# Patient Record
Sex: Female | Born: 1940 | Race: White | Hispanic: No | Smoking: Former smoker
Health system: Southern US, Community
[De-identification: ages and names within clinical notes are randomized; demographics above are authoritative.]

## PROBLEM LIST (undated history)

## (undated) DIAGNOSIS — I10 Essential (primary) hypertension: Secondary | ICD-10-CM

## (undated) DIAGNOSIS — N750 Cyst of Bartholin's gland: Secondary | ICD-10-CM

## (undated) DIAGNOSIS — R894 Abnormal immunological findings in specimens from other organs, systems and tissues: Secondary | ICD-10-CM

## (undated) DIAGNOSIS — Z113 Encounter for screening for infections with a predominantly sexual mode of transmission: Secondary | ICD-10-CM

## (undated) DIAGNOSIS — K589 Irritable bowel syndrome without diarrhea: Secondary | ICD-10-CM

## (undated) DIAGNOSIS — F419 Anxiety disorder, unspecified: Secondary | ICD-10-CM

## (undated) DIAGNOSIS — M549 Dorsalgia, unspecified: Secondary | ICD-10-CM

## (undated) DIAGNOSIS — N993 Prolapse of vaginal vault after hysterectomy: Secondary | ICD-10-CM

## (undated) DIAGNOSIS — E785 Hyperlipidemia, unspecified: Secondary | ICD-10-CM

## (undated) DIAGNOSIS — Z8489 Family history of other specified conditions: Secondary | ICD-10-CM

## (undated) HISTORY — DX: Anxiety disorder, unspecified: F41.9

## (undated) HISTORY — DX: Abnormal immunological findings in specimens from other organs, systems and tissues: R89.4

## (undated) HISTORY — DX: Dorsalgia, unspecified: M54.9

## (undated) HISTORY — DX: Prolapse of vaginal vault after hysterectomy: N99.3

## (undated) HISTORY — PX: BLADDER REPAIR: SHX76

## (undated) HISTORY — DX: Encounter for screening for infections with a predominantly sexual mode of transmission: Z11.3

## (undated) HISTORY — DX: Cyst of Bartholin's gland: N75.0

## (undated) HISTORY — PX: TONSILLECTOMY: SUR1361

## (undated) HISTORY — PX: WRIST SURGERY: SHX841

## (undated) HISTORY — DX: Irritable bowel syndrome, unspecified: K58.9

## (undated) HISTORY — PX: ABDOMINAL HYSTERECTOMY: SHX81

---

## 2000-05-22 ENCOUNTER — Inpatient Hospital Stay (HOSPITAL_COMMUNITY): Admission: EM | Admit: 2000-05-22 | Discharge: 2000-05-24 | Payer: Self-pay | Admitting: Cardiology

## 2001-05-02 ENCOUNTER — Encounter: Payer: Self-pay | Admitting: Family Medicine

## 2001-05-02 ENCOUNTER — Ambulatory Visit (HOSPITAL_COMMUNITY): Admission: RE | Admit: 2001-05-02 | Discharge: 2001-05-02 | Payer: Self-pay | Admitting: Family Medicine

## 2001-05-17 ENCOUNTER — Ambulatory Visit (HOSPITAL_COMMUNITY): Admission: RE | Admit: 2001-05-17 | Discharge: 2001-05-17 | Payer: Self-pay | Admitting: Unknown Physician Specialty

## 2001-05-17 ENCOUNTER — Encounter: Payer: Self-pay | Admitting: Unknown Physician Specialty

## 2001-06-07 ENCOUNTER — Ambulatory Visit (HOSPITAL_COMMUNITY): Admission: RE | Admit: 2001-06-07 | Discharge: 2001-06-07 | Payer: Self-pay | Admitting: Unknown Physician Specialty

## 2001-06-07 ENCOUNTER — Encounter: Payer: Self-pay | Admitting: Unknown Physician Specialty

## 2001-06-24 ENCOUNTER — Encounter (HOSPITAL_COMMUNITY): Admission: RE | Admit: 2001-06-24 | Discharge: 2001-07-24 | Payer: Self-pay | Admitting: Unknown Physician Specialty

## 2001-07-25 ENCOUNTER — Ambulatory Visit (HOSPITAL_COMMUNITY): Admission: RE | Admit: 2001-07-25 | Discharge: 2001-07-25 | Payer: Self-pay | Admitting: Unknown Physician Specialty

## 2001-07-25 ENCOUNTER — Encounter: Payer: Self-pay | Admitting: Unknown Physician Specialty

## 2003-09-16 ENCOUNTER — Emergency Department (HOSPITAL_COMMUNITY): Admission: EM | Admit: 2003-09-16 | Discharge: 2003-09-16 | Payer: Self-pay | Admitting: Emergency Medicine

## 2003-09-18 ENCOUNTER — Ambulatory Visit (HOSPITAL_COMMUNITY): Admission: RE | Admit: 2003-09-18 | Discharge: 2003-09-18 | Payer: Self-pay | Admitting: Family Medicine

## 2004-08-24 ENCOUNTER — Emergency Department (HOSPITAL_COMMUNITY): Admission: EM | Admit: 2004-08-24 | Discharge: 2004-08-24 | Payer: Self-pay | Admitting: Emergency Medicine

## 2005-01-30 ENCOUNTER — Ambulatory Visit (HOSPITAL_COMMUNITY): Admission: RE | Admit: 2005-01-30 | Discharge: 2005-01-30 | Payer: Self-pay | Admitting: Family Medicine

## 2005-09-05 ENCOUNTER — Ambulatory Visit (HOSPITAL_COMMUNITY): Admission: RE | Admit: 2005-09-05 | Discharge: 2005-09-05 | Payer: Self-pay | Admitting: Family Medicine

## 2005-11-08 ENCOUNTER — Ambulatory Visit (HOSPITAL_COMMUNITY): Admission: RE | Admit: 2005-11-08 | Discharge: 2005-11-08 | Payer: Self-pay | Admitting: Family Medicine

## 2006-11-22 ENCOUNTER — Ambulatory Visit (HOSPITAL_COMMUNITY): Admission: RE | Admit: 2006-11-22 | Discharge: 2006-11-22 | Payer: Self-pay | Admitting: Orthopaedic Surgery

## 2006-11-27 ENCOUNTER — Ambulatory Visit (HOSPITAL_COMMUNITY): Admission: RE | Admit: 2006-11-27 | Discharge: 2006-11-27 | Payer: Self-pay | Admitting: Family Medicine

## 2007-08-07 ENCOUNTER — Ambulatory Visit (HOSPITAL_BASED_OUTPATIENT_CLINIC_OR_DEPARTMENT_OTHER): Admission: RE | Admit: 2007-08-07 | Discharge: 2007-08-08 | Payer: Self-pay | Admitting: Orthopedic Surgery

## 2007-12-19 ENCOUNTER — Ambulatory Visit (HOSPITAL_COMMUNITY): Admission: RE | Admit: 2007-12-19 | Discharge: 2007-12-19 | Payer: Self-pay | Admitting: Family Medicine

## 2008-12-21 ENCOUNTER — Ambulatory Visit (HOSPITAL_COMMUNITY): Admission: RE | Admit: 2008-12-21 | Discharge: 2008-12-21 | Payer: Self-pay | Admitting: Family Medicine

## 2009-01-07 ENCOUNTER — Ambulatory Visit (HOSPITAL_COMMUNITY): Admission: RE | Admit: 2009-01-07 | Discharge: 2009-01-07 | Payer: Self-pay | Admitting: Family Medicine

## 2009-07-28 ENCOUNTER — Ambulatory Visit (HOSPITAL_COMMUNITY): Admission: RE | Admit: 2009-07-28 | Discharge: 2009-07-28 | Payer: Self-pay | Admitting: Family Medicine

## 2009-09-22 ENCOUNTER — Encounter (HOSPITAL_COMMUNITY): Admission: RE | Admit: 2009-09-22 | Discharge: 2009-10-22 | Payer: Self-pay | Admitting: Orthopaedic Surgery

## 2009-10-21 ENCOUNTER — Ambulatory Visit (HOSPITAL_COMMUNITY): Admission: RE | Admit: 2009-10-21 | Discharge: 2009-10-21 | Payer: Self-pay | Admitting: Orthopaedic Surgery

## 2010-02-02 ENCOUNTER — Ambulatory Visit (HOSPITAL_COMMUNITY): Admission: RE | Admit: 2010-02-02 | Discharge: 2010-02-02 | Payer: Self-pay | Admitting: Orthopaedic Surgery

## 2010-04-12 ENCOUNTER — Ambulatory Visit (HOSPITAL_COMMUNITY): Admission: RE | Admit: 2010-04-12 | Discharge: 2010-04-12 | Payer: Self-pay | Admitting: Family Medicine

## 2010-05-23 ENCOUNTER — Ambulatory Visit (HOSPITAL_COMMUNITY): Admission: RE | Admit: 2010-05-23 | Discharge: 2010-05-23 | Payer: Self-pay | Admitting: Family Medicine

## 2010-07-31 ENCOUNTER — Encounter: Payer: Self-pay | Admitting: Family Medicine

## 2010-11-22 NOTE — Op Note (Signed)
NAME:  Anne Davenport, Anne Davenport                ACCOUNT NO.:  [id number]   MEDICAL RECORD NO.:  [id number]          PATIENT TYPE:  AMB   LOCATION:  DSC                          FACILITY:  [facility]   PHYSICIAN:  Matthew A. 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   PROCEDURES:  1. Left wrist arthroscopy with debridement of triangular      fibrocartilage and scapholunate interosseous ligament tears.  2. Left wrist open ulnar shortening osteotomy using the Tri-Med ulnar      shortening plate   SURGEON:  Artist Pais. Mina Marble, M.D.   ASSISTANT:  None.   ANESTHESIA:  Axillary block and general.   TOURNIQUET TIME:  1 hour and 7 minutes.   COMPLICATIONS:  None.   DRAINS:  None.   OPERATIVE REPORT:  The patient was taken to the operating suite.  After  the induction of adequate general anesthesia and axillary block  analgesia combined, the left upper extremity was prepped and draped in  sterile fashion.  An Esmarch was used to exsanguinate the limb,  tourniquet inflated to 250 mmHg.  At this point in time an incision was  made at the subcutaneous border of the ulna 4 cm proximal to the tip the  ulnar styloid.  Skin was incised.  The interval between the ECU and FCU  was identified and split.  Dissection was carried down to the ulna.  A  subperiosteal dissection was undertaken until the volar aspect of the  ulna was identified.  At this point in time a Tri-Med plate was fastened  to the volar aspect of the ulna and secured with three proximal screws.  After this was done, a screw was placed in the distal-most  extent of the  slotted hole distally.  This was then followed by placement of the drill  guide and then the saw guide, where a 4-mm wedge of bone was removed.  It was then compressed using the Tri-Med technique, followed by  application of a 20-mm lag screw across the osteotomy site.  The  remaining distal screws were then placed.  All the instrumentation was  removed.  The wound was irrigated and loosely closed with 2-0 Vicryl and  a 3-0 Prolene subcuticular stitch.  Intraoperative fluoroscopy revealed  a switch from ulnar-positive variance of 1-2 mm to a -3 ulnar negative  variance.  At this point in time the patient's left upper extremity was  then placed in the wrist traction tower with 15 pounds of  countertraction across the radiocarpal joint.  A standard 3/4  arthroscopic portal was established.  The scope was introduced into the  joint.  Visualization revealed a grade 1 SL tear and a large,  degenerative TFCC tear.  An 18-gauge needle was used as a 6U outflow  portal, followed by  a 4/5 working portal under direct vision.  A 2.9  suction shaver was used to debride the TFCC tear.  After this was done,  the instruments and scope were reversed.  The scope was placed in he 4/5  portal, instruments in the 3/4 portal, and a radial-sided debridement  including the grade 1 SL tear was undertaken.  At this point in time the  instruments were removed.  The two portals were closed with 3-0 Prolene  subcuticular stitches, Steri-Strips, 4x4s, fluffs, and a sugar-tong  splint was applied.  The patient tolerated the procedures well and went  to recovery in stable fashion.      Artist Pais Mina Marble, M.D.  Electronically Signed     MAW/MEDQ  D:  08/07/2007  T:  08/07/2007  Job:  [id number]

## 2010-11-25 NOTE — Discharge Summary (Signed)
[place]. [facility]  Patient:    Anne Davenport, Anne Davenport                       MRN: [id number] Adm. Date:  [id number] Disc. Date: 05/24/00 Attending:  Rollene Rotunda DictatorAnnett Fabian, P.A. [facility] CC:         Dr. Katharine Look   Referring Physician Discharge [place]  DISCHARGE DIAGNOSES: 1. Noncardiac chest pain. 2. Suspected gastroesophageal reflux disease.  HOSPITAL COURSE:  Patient is a 70 year old female with no prior cardiac history.  She presented with increasing pain with exertion.  This was increasing in frequency over the past few weeks.  The day of admission, she complained of rest pain, substernal in nature, radiating to the left arm and straight though to the back.  She reported shortness of breath and some nausea.  The pain was rated at an 8/10.  Pain improved in the emergency room with sublingual nitroglycerin.  She was admitted by Dr. Daiva Nakayama with a diagnosis of unstable angina, rule out MI.  She was placed on heparin, IV nitroglycerin, aspirin, and a beta blocker.  On November 14, patient was taken to the catheterization laboratory by Dr. Veneda Melter.  Catheterization results: 1. No aortic dissection. 2. Normal left main. 3. Mild LAD disease, 30% narrowing in the proximal diagonal. 4. Circumflex three OMs with some luminal irregularities. 5. Dominant RCA with also some luminal irregularities. 6. Ejection fraction of greater than 70%.  No mitral regurgitation.  In light of the catheterization results, patients discomfort was believed to be GI in origin.  She was started on Prevacid 30 mg once a day.  Patient was counseled as to risk factor modification.  She was also started on Zocor 20. She was also counselled to follow a low fat/low cholesterol diet.  Patients EKG revealed normal sinus rhythm, no ectopy, no ischemic changes.  DISCHARGE MEDICATIONS: 1. Prevacid 30 mg once daily one-half hour before meals. 2. Zocor 20 mg once  daily at bedtime. 3. Climara patch once per week. 4. Claritin 10 mg once daily. 5. Vioxx 25 mg once daily. 6. Enteric-coated aspirin 325 mg once daily.  LABORATORY VALUES:  Serial cardiac enzymes negative for MI.  TSH 4.713.  CBC from [facility] revealed white count 8.8, hemoglobin 12.9, hematocrit 38.5, platelets 246.  Metabolic panel, also from [facility], sodium 140, potassium 4.1, chloride 104, CO2 24, BUN 23, creatinine 1.0, glucose 107.  Total cholesterol 196, triglycerides 79, HDL 41, LDL 139, total cholesterol/HDL ratio 4.8.  DISPOSITION:  Patient was discharged home in stable condition.  FOLLOW-UP:  With Dr. Katharine Look as needed or scheduled. DD:  05/24/00 TD:  05/24/00 Job: [id number]

## 2010-11-25 NOTE — Cardiovascular Report (Signed)
[place]  Patient:    Anne Davenport, Anne Davenport                       MRN: [id number] Proc. Date: 05/23/00 Adm. Date:  [id number] Attending:  Rollene Rotunda CC:         Rollene Rotunda, M.D. [facility]  Dr. Sudie Bailey   Cardiac Catheterization  PROCEDURE: 1. Left heart catheterization 2. Left ventriculogram. 3. Selective coronary angiography. 4. Aortic root angiography.  DIAGNOSES: 1. Trivial coronary artery disease by angiogram. 2. Normal left ventricular systolic function. 3. Normal aortic root.  HISTORY:  Ms. Anne Davenport is a 70 year old white female who presents with crescendo substernal chest discomfort.  The patient was admitted to the hospital and subsequently ruled out for acute myocardial infarction.  She presents now for left heart catheterization.  TECHNIQUE:  After informed consent was obtained, the patient was brought to the catheterization lab.  Both groins were sterilely prepped and draped; 1% lidocaine was used to infiltrate the left groin site.  A 6-French sheath was placed in the right femoral artery using modified Seldinger technique; 6-French JL4 and JR4 cathers were used to engage the left and right coronary arteries.  Selective angiography performed in both projections using manual injection contrast.  A 6-French pigtail catheter was then advanced in the left ventricle, and a left ventriculogram was performed using power injection contrast.  The pigtail catheter was positioned in the ascending aorta, and an aortic root angiogram was performed using power injection as a  contrast.  At the termination of the case, catheters and sheath were removed and manual pressure applied until adequate hemostasis was achieved.  The patient tolerated the procedure well and was transferred to recovery in stable condition.  FINDINGS: 1. Left main trunk: A large caliber vessel, angiographically normal. 2. Left anterior descending artery (LAD): This is a  large caliber vessel that    provides the first diagonal branch, proximal segment.  There are mild    irregularities in the LAD system.  There is a mild narrowing of 30% in the    proximal segment of the first diagonal branch. 3. Left circumflex artery: This is a large caliber vessel that provides two    large marginal branches of proximal segment and a third marginal branch in    the mid section.  There are luminal irregularities in the left circumflex    system. 4. Right coronary artery is dominant.  This is a medium caliber vessel that    provides a posterior descending artery and two small posterior ventricular    branches in its terminal segment.  There are luminal irregularities in the    right coronary system. 5. Left ventricle: Normal end systolic and end diastolic dimensions.  Overall    left ventricular function well preserved, ejection fraction greater than    70%.  No mitral regurgitation.  LV pressure 110/5, aortic 110/62, LV EDP    equals 20. 6. Aortic root is of normal caliber with no evidence of dissection,    dilatation, or stenosis.  The arch vessels are patent in their proximal    segment.  ASSESSMENT/PLAN:  Ms. Anne Davenport is a 70 year old female with noncardiac chest point.  At this point, continued risk factor modification will be pursued and other causes of chest pain investigated. DD:  05/23/00 TD:  05/23/00 Job: [id number]

## 2011-03-30 LAB — POCT HEMOGLOBIN-HEMACUE: Hemoglobin: 12.7

## 2011-04-13 ENCOUNTER — Other Ambulatory Visit (HOSPITAL_COMMUNITY): Payer: Self-pay | Admitting: Family Medicine

## 2011-04-13 DIAGNOSIS — Z139 Encounter for screening, unspecified: Secondary | ICD-10-CM

## 2011-04-17 ENCOUNTER — Ambulatory Visit (HOSPITAL_COMMUNITY)
Admission: RE | Admit: 2011-04-17 | Discharge: 2011-04-17 | Disposition: A | Discharge status: Home / Self Care | Payer: Medicare Other | Source: Ambulatory Visit | Attending: Family Medicine | Admitting: Family Medicine

## 2011-04-17 DIAGNOSIS — Z1231 Encounter for screening mammogram for malignant neoplasm of breast: Secondary | ICD-10-CM | POA: Insufficient documentation

## 2011-04-17 DIAGNOSIS — Z139 Encounter for screening, unspecified: Secondary | ICD-10-CM

## 2011-06-08 ENCOUNTER — Emergency Department (HOSPITAL_COMMUNITY): Payer: Medicare Other

## 2011-06-08 ENCOUNTER — Encounter: Payer: Self-pay | Admitting: *Deleted

## 2011-06-08 ENCOUNTER — Emergency Department (HOSPITAL_COMMUNITY)
Admission: EM | Admit: 2011-06-08 | Discharge: 2011-06-08 | Disposition: A | Discharge status: Home / Self Care | Payer: Medicare Other | Attending: Emergency Medicine | Admitting: Emergency Medicine

## 2011-06-08 DIAGNOSIS — Z9079 Acquired absence of other genital organ(s): Secondary | ICD-10-CM | POA: Insufficient documentation

## 2011-06-08 DIAGNOSIS — J069 Acute upper respiratory infection, unspecified: Secondary | ICD-10-CM

## 2011-06-08 DIAGNOSIS — E785 Hyperlipidemia, unspecified: Secondary | ICD-10-CM | POA: Insufficient documentation

## 2011-06-08 DIAGNOSIS — J40 Bronchitis, not specified as acute or chronic: Secondary | ICD-10-CM | POA: Insufficient documentation

## 2011-06-08 DIAGNOSIS — R05 Cough: Secondary | ICD-10-CM | POA: Insufficient documentation

## 2011-06-08 DIAGNOSIS — I10 Essential (primary) hypertension: Secondary | ICD-10-CM | POA: Insufficient documentation

## 2011-06-08 DIAGNOSIS — R059 Cough, unspecified: Secondary | ICD-10-CM | POA: Insufficient documentation

## 2011-06-08 HISTORY — DX: Hyperlipidemia, unspecified: E78.5

## 2011-06-08 HISTORY — DX: Essential (primary) hypertension: I10

## 2011-06-08 MED ORDER — ONDANSETRON HCL 4 MG/2ML IJ SOLN
4.0000 mg | Freq: Once | INTRAMUSCULAR | Status: AC
  Administered 2011-06-08: 4 mg via INTRAVENOUS
  Filled 2011-06-08: qty 2

## 2011-06-08 MED ORDER — PREDNISONE 10 MG PO TABS: 20.0000 mg | ORAL_TABLET | Freq: Every day | ORAL | Status: AC

## 2011-06-08 MED ORDER — ALBUTEROL SULFATE (5 MG/ML) 0.5% IN NEBU
2.5000 mg | INHALATION_SOLUTION | Freq: Once | RESPIRATORY_TRACT | Status: AC
  Administered 2011-06-08: 2.5 mg via RESPIRATORY_TRACT
  Filled 2011-06-08: qty 0.5

## 2011-06-08 MED ORDER — PREDNISONE 20 MG PO TABS
60.0000 mg | ORAL_TABLET | Freq: Once | ORAL | Status: AC
  Administered 2011-06-08: 60 mg via ORAL
  Filled 2011-06-08: qty 3

## 2011-06-08 MED ORDER — OXYCODONE-ACETAMINOPHEN 5-325 MG PO TABS: ORAL_TABLET | ORAL | Status: DC

## 2011-06-08 MED ORDER — PROMETHAZINE HCL 25 MG PO TABS: ORAL_TABLET | ORAL | Status: DC

## 2011-06-08 MED ORDER — SODIUM CHLORIDE 0.9 % IV BOLUS (SEPSIS)
500.0000 mL | Freq: Once | INTRAVENOUS | Status: AC
  Administered 2011-06-08: 500 mL via INTRAVENOUS

## 2011-06-08 MED ORDER — OXYCODONE-ACETAMINOPHEN 5-325 MG PO TABS
1.0000 | ORAL_TABLET | Freq: Once | ORAL | Status: DC
  Filled 2011-06-08: qty 1

## 2011-06-08 MED ORDER — OXYCODONE-ACETAMINOPHEN 5-325 MG PO TABS
0.5000 | ORAL_TABLET | Freq: Once | ORAL | Status: AC
  Administered 2011-06-08: 0.5 via ORAL
  Filled 2011-06-08: qty 1

## 2011-06-08 MED ORDER — ALBUTEROL SULFATE HFA 108 (90 BASE) MCG/ACT IN AERS: 1.0000 | INHALATION_SPRAY | Freq: Four times a day (QID) | RESPIRATORY_TRACT | Status: DC | PRN

## 2011-06-08 NOTE — ED Notes (Signed)
Pt given discharge instructions, paperwork & prescription(s), pt verbalized understanding.   

## 2011-06-08 NOTE — ED Notes (Signed)
Pt reports started off w/ a sore throat, now productive cough. Pt states having nausea & vomiting has been unable to keep anything down today.

## 2011-06-08 NOTE — ED Provider Notes (Signed)
History  Scribed for [employer]. Colon Branch, MD, the patient was seen in room APA17/APA17. This chart was scribed by Candelaria Stagers. The patient's care started at 19:34   CSN: [id number] Arrival date & time: 06/08/2011  7:00 PM   First MD Initiated Contact with Patient 06/08/11 1902      Chief Complaint  Patient presents with  . Cough     The history is provided by the patient.   Anne Davenport is a 70 y.o. female who presents to the Emergency Department complaining of a productive, coarse, continuous cough that started about a week ago and has been getting worse over the past 2 days.  Patient was seen by Urgent Care 4 days ago and was given a Z-pack but has had no relief.  She is experiencing nausea and vomiting which started two days ago.  She has had SOB that started yesterday and is worse at night.  She has no body aches, but complains of being weak and fatigued.  Her PCP is Dr. Gaye Alken       Past Medical History  Diagnosis Date  . Hypertension   . Hyperlipemia     Past Surgical History  Procedure Date  . Abdominal hysterectomy   . Tonsillectomy   . Bladder repair     No family history on file.  History  Substance Use Topics  . Smoking status: Never Smoker   . Smokeless tobacco: Not on file  . Alcohol Use: Yes    OB History    Grav Para Term Preterm Abortions TAB SAB Ect Mult Living                  Review of Systems  Constitutional: Positive for fatigue. Negative for fever and chills.  HENT: Positive for sore throat. Negative for rhinorrhea.   Respiratory: Positive for cough (course cough) and shortness of breath.   Gastrointestinal: Positive for nausea and vomiting.  10 Systems reviewed and are negative for acute change except as noted in the HPI.  Allergies  Codeine  Home Medications  No current outpatient prescriptions on file.  BP 144/70  Pulse 95  Temp(Src) 97.7 F (36.5 C) (Oral)  Resp 18  Ht 5\' 3"  (1.6 m)  Wt 134 lb (60.782 kg)  BMI 23.74  kg/m2  SpO2 100%  Physical Exam  Nursing note and vitals reviewed. Constitutional: She is oriented to person, place, and time. She appears well-developed and well-nourished. No distress.  HENT:  Head: Normocephalic and atraumatic.  Mouth/Throat: Oropharynx is clear and moist. No oropharyngeal exudate.  Eyes: EOM are normal. Right eye exhibits no discharge. Left eye exhibits no discharge.  Neck: Normal range of motion. Neck supple.  Cardiovascular: Normal rate and regular rhythm.  Exam reveals no gallop and no friction rub.   Pulmonary/Chest: She has no wheezes. She has no rales.       tachypnic  Abdominal: Soft. Bowel sounds are normal.  Neurological: She is alert and oriented to person, place, and time.  Skin: Skin is warm and dry.  Psychiatric: She has a normal mood and affect. Her behavior is normal.    ED Course  Procedures  DIAGNOSTIC STUDIES: Oxygen Saturation is 100% on room air, normal by my interpretation.    COORDINATION OF CARE:  19:43 Ordered: albuterol (PROVENTIL) (5 MG/ML) 0.5% nebulizer solution 2.5 mg ; ondansetron (ZOFRAN) injection 4 mg ; sodium chloride 0.9 % bolus 500 mL ; oxyCODONE-acetaminophen (PERCOCET) 5-325 MG per tablet 1 tablet, predniSONE (DELTASONE) tablet  60 mg   20:59 Recheck: Patient reports she is feeling better.  Discussed course of care with patient.        Labs Reviewed - No data to display Dg Chest 2 View  06/08/2011  *RADIOLOGY REPORT*  Clinical Data: Cough and sore throat  CHEST - 2 VIEW  Comparison: 09/18/2003  Findings: Borderline cardiomegaly.  Hyperaeration.  Bronchitic changes.  Pulmonary vascularity is within normal limits.  Scarring in the right upper lobe laterally.  No pneumothorax and no pleural effusion.  IMPRESSION: No active cardiopulmonary disease.  Chronic changes.  Original Report Authenticated By: Donavan Burnet, M.D.       MDM  Patient with URI symptoms that began on Monday, seen at Urgent Care and begun on  antibiotics. Continued cough, chest discomfort with the cough, nausea, vomiting, general malaise. Initiated IVF, analgesics, steroids, nebulizer treatment with improvement. Chest xray with bronchitic changes. Pt feels improved after observation and/or treatment in ED.Pt stable in ED with no significant deterioration in condition.The patient appears reasonably screened and/or stabilized for discharge and I doubt any other medical condition or other [facility] requiring further screening, evaluation, or treatment in the ED at this time prior to discharge.  I personally performed the services described in this documentation, which was scribed in my presence. The recorded information has been reviewed and considered.  MDM Reviewed: nursing note and vitals Interpretation: x-ray         Nicoletta Dress. Colon Branch, MD 06/08/11 2117

## 2011-06-08 NOTE — ED Notes (Signed)
Pt reports she was seen at urgent care 2 days ago and dx with bronchitis, pt reports her cough is worse and she is now very nauseated

## 2011-08-25 ENCOUNTER — Emergency Department (HOSPITAL_COMMUNITY): Payer: Medicare Other

## 2011-08-25 ENCOUNTER — Emergency Department (HOSPITAL_COMMUNITY)
Admission: EM | Admit: 2011-08-25 | Discharge: 2011-08-25 | Disposition: A | Discharge status: Home / Self Care | Payer: Medicare Other | Attending: Emergency Medicine | Admitting: Emergency Medicine

## 2011-08-25 ENCOUNTER — Encounter (HOSPITAL_COMMUNITY): Payer: Self-pay

## 2011-08-25 ENCOUNTER — Other Ambulatory Visit: Payer: Self-pay

## 2011-08-25 DIAGNOSIS — Z9079 Acquired absence of other genital organ(s): Secondary | ICD-10-CM | POA: Insufficient documentation

## 2011-08-25 DIAGNOSIS — S40011A Contusion of right shoulder, initial encounter: Secondary | ICD-10-CM

## 2011-08-25 DIAGNOSIS — S1093XA Contusion of unspecified part of neck, initial encounter: Secondary | ICD-10-CM | POA: Insufficient documentation

## 2011-08-25 DIAGNOSIS — R55 Syncope and collapse: Secondary | ICD-10-CM

## 2011-08-25 DIAGNOSIS — I4949 Other premature depolarization: Secondary | ICD-10-CM | POA: Insufficient documentation

## 2011-08-25 DIAGNOSIS — Y92009 Unspecified place in unspecified non-institutional (private) residence as the place of occurrence of the external cause: Secondary | ICD-10-CM | POA: Insufficient documentation

## 2011-08-25 DIAGNOSIS — W19XXXA Unspecified fall, initial encounter: Secondary | ICD-10-CM | POA: Insufficient documentation

## 2011-08-25 DIAGNOSIS — S20219A Contusion of unspecified front wall of thorax, initial encounter: Secondary | ICD-10-CM | POA: Insufficient documentation

## 2011-08-25 DIAGNOSIS — S0093XA Contusion of unspecified part of head, initial encounter: Secondary | ICD-10-CM

## 2011-08-25 DIAGNOSIS — E785 Hyperlipidemia, unspecified: Secondary | ICD-10-CM | POA: Insufficient documentation

## 2011-08-25 DIAGNOSIS — D72829 Elevated white blood cell count, unspecified: Secondary | ICD-10-CM | POA: Insufficient documentation

## 2011-08-25 DIAGNOSIS — S40019A Contusion of unspecified shoulder, initial encounter: Secondary | ICD-10-CM | POA: Insufficient documentation

## 2011-08-25 DIAGNOSIS — I10 Essential (primary) hypertension: Secondary | ICD-10-CM | POA: Insufficient documentation

## 2011-08-25 DIAGNOSIS — S0003XA Contusion of scalp, initial encounter: Secondary | ICD-10-CM | POA: Insufficient documentation

## 2011-08-25 DIAGNOSIS — M25519 Pain in unspecified shoulder: Secondary | ICD-10-CM | POA: Insufficient documentation

## 2011-08-25 DIAGNOSIS — J40 Bronchitis, not specified as acute or chronic: Secondary | ICD-10-CM | POA: Insufficient documentation

## 2011-08-25 LAB — CBC
Hemoglobin: 14.7 g/dL (ref 12.0–15.0)
MCH: 28.4 pg (ref 26.0–34.0)
Platelets: 306 10*3/uL (ref 150–400)
RBC: 5.18 MIL/uL — ABNORMAL HIGH (ref 3.87–5.11)
WBC: 18.2 10*3/uL — ABNORMAL HIGH (ref 4.0–10.5)

## 2011-08-25 LAB — URINALYSIS, ROUTINE W REFLEX MICROSCOPIC
Hgb urine dipstick: NEGATIVE
Leukocytes, UA: NEGATIVE
Nitrite: NEGATIVE
Protein, ur: NEGATIVE mg/dL
Specific Gravity, Urine: 1.01 (ref 1.005–1.030)
Urobilinogen, UA: 0.2 mg/dL (ref 0.0–1.0)

## 2011-08-25 LAB — BASIC METABOLIC PANEL
BUN: 22 mg/dL (ref 6–23)
CO2: 21 mEq/L (ref 19–32)
Chloride: 104 mEq/L (ref 96–112)
Glucose, Bld: 166 mg/dL — ABNORMAL HIGH (ref 70–99)
Potassium: 3.7 mEq/L (ref 3.5–5.1)
Sodium: 138 mEq/L (ref 135–145)

## 2011-08-25 LAB — PROTIME-INR
INR: 0.96 (ref 0.00–1.49)
Prothrombin Time: 13 seconds (ref 11.6–15.2)

## 2011-08-25 LAB — APTT: aPTT: 27 seconds (ref 24–37)

## 2011-08-25 LAB — DIFFERENTIAL
Eosinophils Absolute: 0 10*3/uL (ref 0.0–0.7)
Lymphocytes Relative: 11 % — ABNORMAL LOW (ref 12–46)
Lymphs Abs: 2 10*3/uL (ref 0.7–4.0)
Monocytes Relative: 6 % (ref 3–12)
Neutrophils Relative %: 83 % — ABNORMAL HIGH (ref 43–77)

## 2011-08-25 MED ORDER — TRAMADOL HCL 50 MG PO TABS: 100.0000 mg | ORAL_TABLET | Freq: Four times a day (QID) | ORAL | Status: AC | PRN

## 2011-08-25 MED ORDER — SODIUM CHLORIDE 0.9 % IV SOLN
INTRAVENOUS | Status: DC
  Administered 2011-08-25: 13:00:00 via INTRAVENOUS

## 2011-08-25 MED ORDER — SODIUM CHLORIDE 0.9 % IV BOLUS (SEPSIS)
1000.0000 mL | Freq: Once | INTRAVENOUS | Status: AC
  Administered 2011-08-25: 1000 mL via INTRAVENOUS

## 2011-08-25 MED ORDER — MORPHINE SULFATE 4 MG/ML IJ SOLN
4.0000 mg | Freq: Once | INTRAMUSCULAR | Status: AC
  Administered 2011-08-25: 4 mg via INTRAVENOUS
  Filled 2011-08-25: qty 1

## 2011-08-25 MED ORDER — TRAMADOL HCL 50 MG PO TABS
100.0000 mg | ORAL_TABLET | Freq: Once | ORAL | Status: AC
  Administered 2011-08-25: 100 mg via ORAL
  Filled 2011-08-25: qty 2

## 2011-08-25 NOTE — ED Notes (Signed)
Patient tearful and c/o headache. Dr Fredricka Bonine made aware and verbal order for 4 mg morphine obtained.

## 2011-08-25 NOTE — ED Notes (Signed)
Pt presents with head injury after falling and striking head on wooden chair. Pt states she lost consciousness. Pt also c/o right shoulder pain.

## 2011-08-25 NOTE — ED Provider Notes (Signed)
History     CSN: [id number]  Arrival date & time 08/25/11  1156   First MD Initiated Contact with Patient 08/25/11 1301    I initially went into the room at 1307 and again at 1320 however patient was in x-ray  Chief Complaint  Patient presents with  . Head Injury  . Dizziness  . Loss of Consciousness  . Shoulder Pain    (Consider location/radiation/quality/duration/timing/severity/associated sxs/prior treatment) HPI  Patient relates she's had bronchitis for the past 1-1/2 weeks. She was seen in urgent care when it first started and was placed on some steroids. She was given a steroid injection and also "a strong antibiotic". She relates however she had to cut the dose down because she was not able to sleep at night. She states that she was having coughing and wheezing which has now improved. She was also seen by her allergist a few days ago and they have continued her on a lower dose of steroids. She also was given a nebulizer treatment and kept on her albuterol. She relates she's not had a high fever, maybe a low-grade fever. She denies vomiting or diarrhea. She relates today she took some newspapers [place] Road [place]. She came back in the house went into her sun room and she turned around after closing the door and acutely got dizzy and fell when she was going to pass out. This was about 11 AM. Her husband said he heard a side and when he went in the room she was lying on the floor. He relates she started talking quickly and her color was pink without diaphoresis. She complains of pain and swelling in the back of her head. She also has pain in the right side of her neck and in her right face. She denies any visual changes she denies nausea or vomiting. She states her tailbone feels achy. She states she feels weak. She denies any chest pain. Patient states this is her  second episode of bronchitis in November. Her family also states she's been under a lot of stress because of the  death of her mother.  PCP Dr. Sudie Bailey  Past Medical History  Diagnosis Date  . Hypertension   . Hyperlipemia     Past Surgical History  Procedure Date  . Abdominal hysterectomy   . Tonsillectomy   . Bladder repair     No family history on file.  History  Substance Use Topics  . Smoking status: Never Smoker   . Smokeless tobacco: Not on file  . Alcohol Use: Yes   lives with spouse  OB History    Grav Para Term Preterm Abortions TAB SAB Ect Mult Living                  Review of Systems  All other systems reviewed and are negative.    Allergies  Codeine and Molds & smuts  Home Medications   Current Outpatient Rx  Name Route Sig Dispense Refill  . ALBUTEROL SULFATE HFA 108 (90 BASE) MCG/ACT IN AERS Inhalation Inhale 1-2 puffs into the lungs every 6 (six) hours as needed for wheezing. 1 Inhaler 0  . EMETROL 1.87-1.87-21.5 PO SOLN Oral Take 5 mLs by mouth as needed. For nausea     . ATORVASTATIN CALCIUM 10 MG PO TABS Oral Take 10 mg by mouth at bedtime.      . AZITHROMYCIN 250 MG PO TABS Oral Take 250 mg by mouth daily. Take two tablets by mouth  on day 1, then take one tablet for 4 days thereafter     . CALCIUM 600 + D PO Oral Take 1 tablet by mouth daily.      . CHOLECALCIFEROL 400 UNITS PO TABS Oral Take 400 Units by mouth daily.      Marland Kitchen DEXTROMETHORPHAN-GUAIFENESIN 10-100 MG/5ML PO LIQD Oral Take 5 mLs by mouth every 4 (four) hours as needed. For cold symptoms     . ESTRADIOL 0.1 MG/24HR TD PTWK Transdermal Place 1 patch onto the skin once a week.      Marland Kitchen FLUTICASONE PROPIONATE 50 MCG/ACT NA SUSP Nasal Place 2 sprays into the nose daily as needed. For nasal congestion     . LOSARTAN POTASSIUM 50 MG PO TABS Oral Take 50 mg by mouth daily.      Marland Kitchen NAPROXEN 500 MG PO TABS Oral Take 500 mg by mouth 2 (two) times daily with a meal. For ARTHRITIS     . OXYCODONE-ACETAMINOPHEN 5-325 MG PO TABS  Use 1/2 to one pill every four hours as needed for cough or pain 15 tablet 0    . PRAVASTATIN SODIUM 40 MG PO TABS Oral Take 40 mg by mouth at bedtime.      Marland Kitchen PROMETHAZINE HCL 25 MG PO TABS  Use 1/2  Pill every 4 - 6 hours as needed for nausea 15 tablet 0  . SALINE NASAL MIST NA Nasal Place 1-2 sprays into the nose daily as needed. For congestion     . SUMATRIPTAN SUCCINATE 100 MG PO TABS Oral Take 100 mg by mouth as needed. For migraines     Prednisone 5 mg daily  BP 151/85  Pulse 80  Temp(Src) 97.7 F (36.5 C) (Oral)  Resp 22  Ht 5\' 3"  (1.6 m)  Wt 134 lb (60.782 kg)  BMI 23.74 kg/m2  SpO2 96%  Vital signs normal except mild hypertension   Physical Exam  Nursing note and vitals reviewed. Constitutional: She is oriented to person, place, and time. She appears well-developed and well-nourished.  Non-toxic appearance. She does not appear ill. No distress.  HENT:  Head: Normocephalic.  Right Ear: External ear normal.  Left Ear: External ear normal.  Nose: Nose normal. No mucosal edema or rhinorrhea.  Mouth/Throat: Mucous membranes are normal. No dental abscesses or uvula swelling.       Tongue dry, just swelling to the back of her scalp without abrasions or lacerations.  Eyes: Conjunctivae and EOM are normal. Pupils are equal, round, and reactive to light.  Neck: Normal range of motion and full passive range of motion without pain. Neck supple.        She has tenderness along the right strap muscles.  Cardiovascular: Regular rhythm and normal heart sounds.  Bradycardia present.  Exam reveals no gallop and no friction rub.   No murmur heard. Pulmonary/Chest: Effort normal. No respiratory distress. She has no wheezes. She has rhonchi. She has no rales. She exhibits no tenderness and no crepitus.       Patient had a coughing spell during my exam. She did have a few rhonchi at the bases. She has a very wet sounding cough  Abdominal: Soft. Normal appearance and bowel sounds are normal. She exhibits no distension. There is no tenderness. There is no rebound and no  guarding.  Musculoskeletal: Normal range of motion. She exhibits no edema and no tenderness.       Moves all extremities well. Patient has no pain in the true shoulder. She  has pain more in the upper thoracic back that would be the medial superior scapular region.  Neurological: She is alert and oriented to person, place, and time. She has normal strength. No cranial nerve deficit.       She uses all extremities well there is no gross neurological deficit.  Skin: Skin is warm, dry and intact. No rash noted. No erythema. No pallor.  Psychiatric: Her speech is normal and behavior is normal. Her mood appears anxious.       Patient tearful when I entered the room.    ED Course  Procedures (including critical care time)   Pt given IV fluids and oral tramadol. She has had urine output and is feeling better.   Results for orders placed during the hospital encounter of 08/25/11  CBC      Component Value Range   WBC 18.2 (*) 4.0 - 10.5 (K/uL)   RBC 5.18 (*) 3.87 - 5.11 (MIL/uL)   Hemoglobin 14.7  12.0 - 15.0 (g/dL)   HCT 62.1  30.8 - 65.7 (%)   MCV 87.1  78.0 - 100.0 (fL)   MCH 28.4  26.0 - 34.0 (pg)   MCHC 32.6  30.0 - 36.0 (g/dL)   RDW 84.6  96.2 - 95.2 (%)   Platelets 306  150 - 400 (K/uL)  DIFFERENTIAL      Component Value Range   Neutrophils Relative 83 (*) 43 - 77 (%)   Neutro Abs 15.0 (*) 1.7 - 7.7 (K/uL)   Lymphocytes Relative 11 (*) 12 - 46 (%)   Lymphs Abs 2.0  0.7 - 4.0 (K/uL)   Monocytes Relative 6  3 - 12 (%)   Monocytes Absolute 1.1 (*) 0.1 - 1.0 (K/uL)   Eosinophils Relative 0  0 - 5 (%)   Eosinophils Absolute 0.0  0.0 - 0.7 (K/uL)   Basophils Relative 0  0 - 1 (%)   Basophils Absolute 0.0  0.0 - 0.1 (K/uL)  BASIC METABOLIC PANEL      Component Value Range   Sodium 138  135 - 145 (mEq/L)   Potassium 3.7  3.5 - 5.1 (mEq/L)   Chloride 104  96 - 112 (mEq/L)   CO2 21  19 - 32 (mEq/L)   Glucose, Bld 166 (*) 70 - 99 (mg/dL)   BUN 22  6 - 23 (mg/dL)   Creatinine, Ser  8.41  0.50 - 1.10 (mg/dL)   Calcium 32.4  8.4 - 10.5 (mg/dL)   GFR calc non Af Amer 84 (*) >90 (mL/min)   GFR calc Af Amer >90  >90 (mL/min)  TROPONIN I      Component Value Range   Troponin I <0.30  <0.30 (ng/mL)  PROTIME-INR      Component Value Range   Prothrombin Time 13.0  11.6 - 15.2 (seconds)   INR 0.96  0.00 - 1.49   APTT      Component Value Range   aPTT 27  24 - 37 (seconds)  URINALYSIS, ROUTINE W REFLEX MICROSCOPIC      Component Value Range   Color, Urine YELLOW  YELLOW    APPearance CLEAR  CLEAR    Specific Gravity, Urine 1.010  1.005 - 1.030    pH 6.5  5.0 - 8.0    Glucose, UA NEGATIVE  NEGATIVE (mg/dL)   Hgb urine dipstick NEGATIVE  NEGATIVE    Bilirubin Urine NEGATIVE  NEGATIVE    Ketones, ur NEGATIVE  NEGATIVE (mg/dL)   Protein, ur NEGATIVE  NEGATIVE (mg/dL)   Urobilinogen, UA 0.2  0.0 - 1.0 (mg/dL)   Nitrite NEGATIVE  NEGATIVE    Leukocytes, UA NEGATIVE  NEGATIVE    Vital signs normal except for leukocytosis consistent with her being on steroids recently   Dg Chest 2 View  08/25/2011  *RADIOLOGY REPORT*  Clinical Data: Right shoulder pain, cough.  Fall, hit back of head.  CHEST - 2 VIEW  Comparison: None  Findings: Heart and mediastinal contours are within normal limits. No focal opacities or effusions.  No acute bony abnormality.  IMPRESSION: No active cardiopulmonary disease.  Original Report Authenticated By: Cyndie Chime, M.D.   Dg Shoulder Right  08/25/2011  *RADIOLOGY REPORT*  Clinical Data: The fall, right shoulder pain.  RIGHT SHOULDER - 2+ VIEW  Comparison: None.  Findings: Calcifications noted near the insertion of the rotator cuff on the greater tuberosity compatible with chronic tendinopathy.  Degenerative changes in the right AC joint. No acute bony abnormality.  Specifically, no fracture, subluxation, or dislocation.  Soft tissues are intact.  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  Degenerative changes  right AC joint.  Original Report Authenticated By: Cyndie Chime, M.D.   Ct Head Wo Contrast  08/25/2011  *RADIOLOGY REPORT*  Clinical Data: Syncope.  Head injury.  CT HEAD WITHOUT CONTRAST  Technique:  Contiguous axial images were obtained from the base of the skull through the vertex without contrast.  Comparison: 05/23/2010  Findings: Large posterior scalp hematoma.  No underlying calvarial abnormality. No acute intracranial abnormality.  Specifically, no hemorrhage, hydrocephalus, mass lesion, acute infarction, or significant intracranial injury.  No acute calvarial abnormality. Visualized paranasal sinuses and mastoids clear.  Orbital soft tissues unremarkable.  IMPRESSION: No acute intracranial abnormality.  Original Report Authenticated By: Cyndie Chime, M.D.    Date: 08/25/2011  Rate: 84  Rhythm: normal sinus rhythm and premature ventricular contractions (PVC)  QRS Axis: normal  Intervals: normal  ST/T Wave abnormalities: normal  Conduction Disutrbances:none  Narrative Interpretation:   Old EKG Reviewed: unchanged from 08/05/2007     1. Syncope   2. Contusion, chest wall   3. Contusion of right shoulder   4. Contusion of head   5. Bronchitis    New Prescriptions   TRAMADOL (ULTRAM) 50 MG TABLET    Take 2 tablets (100 mg total) by mouth every 6 (six) hours as needed for pain.   Plan discharge  Devoria Albe, MD, FACEP    MDM          Ward Givens, MD 08/25/11 [phone]

## 2011-08-25 NOTE — Discharge Instructions (Signed)
Ice packs to the swollen or tender areas. Take the tramadol for pain. Try to drink more fluids like sports drinks so you don't get dehydrated. Return to the ED for any problems listed on the head injury sheet.

## 2012-03-12 ENCOUNTER — Other Ambulatory Visit (HOSPITAL_COMMUNITY): Payer: Self-pay | Admitting: Family Medicine

## 2012-03-12 DIAGNOSIS — Z139 Encounter for screening, unspecified: Secondary | ICD-10-CM

## 2012-03-12 DIAGNOSIS — R52 Pain, unspecified: Secondary | ICD-10-CM

## 2012-04-18 ENCOUNTER — Ambulatory Visit (HOSPITAL_COMMUNITY)
Admission: RE | Admit: 2012-04-18 | Discharge: 2012-04-18 | Disposition: A | Discharge status: Home / Self Care | Payer: Medicare Other | Source: Ambulatory Visit | Attending: Family Medicine | Admitting: Family Medicine

## 2012-04-18 DIAGNOSIS — Z139 Encounter for screening, unspecified: Secondary | ICD-10-CM

## 2012-04-24 ENCOUNTER — Ambulatory Visit (HOSPITAL_COMMUNITY)
Admission: RE | Admit: 2012-04-24 | Discharge: 2012-04-24 | Disposition: A | Discharge status: Home / Self Care | Payer: Medicare Other | Source: Ambulatory Visit | Attending: Family Medicine | Admitting: Family Medicine

## 2012-04-24 ENCOUNTER — Other Ambulatory Visit (HOSPITAL_COMMUNITY): Payer: Self-pay | Admitting: Family Medicine

## 2012-04-24 DIAGNOSIS — R52 Pain, unspecified: Secondary | ICD-10-CM

## 2012-04-24 DIAGNOSIS — N644 Mastodynia: Secondary | ICD-10-CM | POA: Insufficient documentation

## 2012-09-11 ENCOUNTER — Ambulatory Visit (HOSPITAL_COMMUNITY)
Admission: RE | Admit: 2012-09-11 | Discharge: 2012-09-11 | Disposition: A | Discharge status: Home / Self Care | Payer: Medicare Other | Source: Ambulatory Visit | Attending: Family Medicine | Admitting: Family Medicine

## 2012-09-11 ENCOUNTER — Other Ambulatory Visit (HOSPITAL_COMMUNITY): Payer: Self-pay | Admitting: Family Medicine

## 2012-09-11 DIAGNOSIS — R52 Pain, unspecified: Secondary | ICD-10-CM

## 2012-09-11 DIAGNOSIS — M546 Pain in thoracic spine: Secondary | ICD-10-CM | POA: Insufficient documentation

## 2012-11-28 ENCOUNTER — Encounter: Payer: Self-pay | Admitting: Adult Health

## 2013-02-24 ENCOUNTER — Other Ambulatory Visit: Payer: Self-pay

## 2013-02-24 ENCOUNTER — Emergency Department (HOSPITAL_COMMUNITY)
Admission: EM | Admit: 2013-02-24 | Discharge: 2013-02-24 | Disposition: A | Discharge status: Home / Self Care | Payer: Medicare Other | Attending: Emergency Medicine | Admitting: Emergency Medicine

## 2013-02-24 ENCOUNTER — Emergency Department (HOSPITAL_COMMUNITY): Payer: Medicare Other

## 2013-02-24 ENCOUNTER — Encounter (HOSPITAL_COMMUNITY): Payer: Self-pay | Admitting: Emergency Medicine

## 2013-02-24 DIAGNOSIS — M79609 Pain in unspecified limb: Secondary | ICD-10-CM | POA: Insufficient documentation

## 2013-02-24 DIAGNOSIS — IMO0002 Reserved for concepts with insufficient information to code with codable children: Secondary | ICD-10-CM | POA: Insufficient documentation

## 2013-02-24 DIAGNOSIS — IMO0001 Reserved for inherently not codable concepts without codable children: Secondary | ICD-10-CM | POA: Insufficient documentation

## 2013-02-24 DIAGNOSIS — R55 Syncope and collapse: Secondary | ICD-10-CM | POA: Insufficient documentation

## 2013-02-24 DIAGNOSIS — E785 Hyperlipidemia, unspecified: Secondary | ICD-10-CM | POA: Insufficient documentation

## 2013-02-24 DIAGNOSIS — R209 Unspecified disturbances of skin sensation: Secondary | ICD-10-CM | POA: Insufficient documentation

## 2013-02-24 DIAGNOSIS — I10 Essential (primary) hypertension: Secondary | ICD-10-CM | POA: Insufficient documentation

## 2013-02-24 DIAGNOSIS — M546 Pain in thoracic spine: Secondary | ICD-10-CM

## 2013-02-24 DIAGNOSIS — Z79899 Other long term (current) drug therapy: Secondary | ICD-10-CM | POA: Insufficient documentation

## 2013-02-24 LAB — COMPREHENSIVE METABOLIC PANEL
AST: 21 U/L (ref 0–37)
Alkaline Phosphatase: 60 U/L (ref 39–117)
BUN: 20 mg/dL (ref 6–23)
CO2: 28 mEq/L (ref 19–32)
Chloride: 103 mEq/L (ref 96–112)
Creatinine, Ser: 0.68 mg/dL (ref 0.50–1.10)
GFR calc non Af Amer: 85 mL/min — ABNORMAL LOW (ref >90)
Total Bilirubin: 0.3 mg/dL (ref 0.3–1.2)

## 2013-02-24 LAB — CBC
HCT: 40.1 % (ref 36.0–46.0)
MCV: 89.1 fL (ref 78.0–100.0)
RBC: 4.5 MIL/uL (ref 3.87–5.11)
WBC: 7.9 10*3/uL (ref 4.0–10.5)

## 2013-02-24 LAB — TROPONIN I: Troponin I: <0.3 ng/mL (ref <0.30)

## 2013-02-24 MED ORDER — SODIUM CHLORIDE 0.9 % IV BOLUS (SEPSIS)
500.0000 mL | Freq: Once | INTRAVENOUS | Status: AC
  Administered 2013-02-24: 500 mL via INTRAVENOUS

## 2013-02-24 MED ORDER — ASPIRIN 81 MG PO CHEW
324.0000 mg | CHEWABLE_TABLET | Freq: Once | ORAL | Status: AC
  Administered 2013-02-24: 324 mg via ORAL
  Filled 2013-02-24: qty 4

## 2013-02-24 NOTE — ED Notes (Signed)
States that she started having chest tightness and back pain, fatigue, which has been present over the last 2 days.

## 2013-02-24 NOTE — ED Provider Notes (Signed)
CSN: [id number]     Arrival date & time 02/24/13  1750 History  This chart was scribed for Hilario Quarry, MD by Bennett Scrape, ED Scribe. This patient was seen in room APA06/[facility]06 and the patient's care was started at 6:59 PM.   Chief Complaint  Patient presents with  . Chest Pain    Patient is a 72 y.o. female presenting with back pain. The history is provided by the patient. No language interpreter was used.  Back Pain Location:  Thoracic spine Quality: tightness. Radiates to:  L shoulder (left chest) Onset quality:  Gradual Duration:  3 weeks Timing:  Constant Progression:  Worsening Chronicity:  New Relieved by: acteminophen. Associated symptoms: numbness (x2 days ago)   Associated symptoms: no bladder incontinence, no bowel incontinence and no fever     HPI Comments: Anne Davenport is a 71 y.o. female who presents to the Emergency Department complaining of 2 to 3 weeks of constant mid back pain that radiates into the left shoulder that has worsened over the past week. The pain is worsened with laying down and she states that the pain now feels like a squeezing sensation in her left chest. She states that she has been able to sleep through the pain. She reports that she has tried naprosyn prescribed for her arthritis, ASA and 2 tylenol. Out of the medications tried only tylenol has improved the symptoms. She denies taking any ASA today. She denies any recent falls or trauma. She reports that she has been able to eat and drink normally since the onset. She denies any recent fevers or cough, nausea or emsis. She admits to recent increased stress and heavy lifting. She reports one episode of syncope with associated bilateral calf pain and bilateral toe numbness that occured 2 days ago. She states that things were spinning and she just collapsed. Her children caught her before hitting the ground. She states that she woke up approximately 5 minutes afterwards. Pt's husband did not see the  episode but stated that the pt returned to baseline after the episode. Pt denies any urinary or bowel incontinence as associated symptoms. She admits that she has told her PCP about the episode and reports one prior episode while being treated for PNA several years ago. She reports prior cardiac cath 13 years ago that showed some CAD at [facility]. She denies following up with a cardiologist regularly since. She reports having a h/o HLD and HTN but denies being on any daily medications.  She denies having a h/o kidney problems. She denies smoking (quit 30 years ago) and states that she is an occasional alcohol user.    PCP is Dr. Sudie Bailey  Past Medical History  Diagnosis Date  . Hypertension   . Hyperlipemia    Past Surgical History  Procedure Laterality Date  . Abdominal hysterectomy    . Tonsillectomy    . Bladder repair     No family history on file. History  Substance Use Topics  . Smoking status: Never Smoker   . Smokeless tobacco: Not on file  . Alcohol Use: Yes   No OB history provided.  Review of Systems  Constitutional: Negative for fever and chills.  Respiratory: Negative for shortness of breath.   Cardiovascular: Negative for leg swelling.  Gastrointestinal: Negative for nausea, vomiting and bowel incontinence.  Genitourinary: Negative for bladder incontinence.  Musculoskeletal: Positive for myalgias (x 2 days ago) and back pain.  Neurological: Positive for syncope (x2 days ago) and numbness (x2  days ago).  All other systems reviewed and are negative.    Allergies  Codeine and Molds & smuts  Home Medications   Current Outpatient Rx  Name  Route  Sig  Dispense  Refill  . EXPIRED: albuterol (PROVENTIL HFA;VENTOLIN HFA) 108 (90 BASE) MCG/ACT inhaler   Inhalation   Inhale 1-2 puffs into the lungs every 6 (six) hours as needed for wheezing.   1 Inhaler   0   . atorvastatin (LIPITOR) 10 MG tablet   Oral   Take 10 mg by mouth at bedtime.           Marland Kitchen azelastine  (ASTELIN) 137 MCG/SPRAY nasal spray   Nasal   Place 2 sprays into the nose 2 (two) times daily. Use in each nostril as directed         . Calcium Carbonate-Vitamin D (CALCIUM 600 + D PO)   Oral   Take 1 tablet by mouth daily.           . cefdinir (OMNICEF) 300 MG capsule   Oral   Take 300 mg by mouth 2 (two) times daily. Started taking on 08-15-11         . cholecalciferol (VITAMIN D) 400 UNITS TABS   Oral   Take 400 Units by mouth daily.           Marland Kitchen estradiol (CLIMARA - DOSED IN MG/24 HR) 0.1 mg/24hr   Transdermal   Place 1 patch onto the skin once a week.           . fluticasone (FLONASE) 50 MCG/ACT nasal spray   Nasal   Place 2 sprays into the nose daily as needed. For nasal congestion          . losartan (COZAAR) 50 MG tablet   Oral   Take 50 mg by mouth daily.           . naproxen (NAPROSYN) 500 MG tablet   Oral   Take 500 mg by mouth 2 (two) times daily with a meal. For ARTHRITIS          . predniSONE (DELTASONE) 5 MG tablet   Oral   Take 5 mg by mouth daily. Tapering off 12 day dose         . SALINE NASAL MIST NA   Nasal   Place 1-2 sprays into the nose daily as needed. For congestion          . SUMAtriptan (IMITREX) 100 MG tablet   Oral   Take 100 mg by mouth as needed. For migraines           Triage Vitals: BP 146/74  Pulse 76  Temp(Src) 97.9 F (36.6 C) (Oral)  Resp 20  Ht 5\' 5"  (1.651 m)  Wt 140 lb (63.504 kg)  BMI 23.3 kg/m2  SpO2 99%  Physical Exam  Nursing note and vitals reviewed. Constitutional: She is oriented to person, place, and time. She appears well-developed and well-nourished. No distress.  HENT:  Head: Normocephalic and atraumatic.  Right Ear: External ear normal.  Left Ear: External ear normal.  Nose: Nose normal.  Mouth/Throat: Oropharynx is clear and moist.  Eyes: Conjunctivae and EOM are normal. Pupils are equal, round, and reactive to light.  Neck: Normal range of motion. No tracheal deviation present.   Cardiovascular: Normal rate, regular rhythm, normal heart sounds and intact distal pulses.   Pulmonary/Chest: Effort normal and breath sounds normal. No respiratory distress.  Abdominal: Soft. There is no tenderness.  Musculoskeletal: Normal range of motion. She exhibits no edema.  Discomfort with palpation to left lateral area of T5    Neurological: She is alert and oriented to person, place, and time. No cranial nerve deficit.  Skin: Skin is warm and dry.  Psychiatric: She has a normal mood and affect. Her behavior is normal. Judgment and thought content normal.    ED Course   Medications  sodium chloride 0.9 % bolus 500 mL (not administered)  aspirin chewable tablet 324 mg (not administered)    DIAGNOSTIC STUDIES: Oxygen Saturation is 99% on room air, normal by my interpretation.    COORDINATION OF CARE: 7:04 PM-Discussed treatment plan which includes CXR, x-Jailyne Chieffo of T-spine, CBC panel, CMP and troponin with pt at bedside and pt agreed to plan.    Procedures (including critical care time)  Labs Reviewed  COMPREHENSIVE METABOLIC PANEL - Abnormal; Notable for the following:    Glucose, Bld 120 (*)    GFR calc non Af Amer 85 (*)    All other components within normal limits  CBC  TROPONIN I   Dg Thoracic Spine W/swimmers  02/24/2013   *RADIOLOGY REPORT*  Clinical Data: Upper back pain.  THORACIC SPINE - 2 VIEW + SWIMMERS  Comparison: Radiographs dated 09/11/2012  Findings: There is no fracture, disc space narrowing, or other significant abnormality of the thoracic spine.  No change since the prior study.  IMPRESSION: Normal exam.   Original Report Authenticated By: Francene Boyers, M.D.   Dg Chest Portable 1 View  02/24/2013   *RADIOLOGY REPORT*  Clinical Data: Chest pain  PORTABLE CHEST - 1 VIEW  Comparison: Thoracic spine series 09/11/2012; chest radiograph 08/25/2011.  Findings: Stable cardiac and mediastinal contours.  No consolidative pulmonary opacities.  No pleural effusion  or pneumothorax.  Regional skeleton is unremarkable.  IMPRESSION: No acute cardiopulmonary process.   Original Report Authenticated By: Annia Belt, M.D   No diagnosis found. Results for orders placed during the hospital encounter of 02/24/13  CBC      Result Value Range   WBC 7.9  4.0 - 10.5 K/uL   RBC 4.50  3.87 - 5.11 MIL/uL   Hemoglobin 13.2  12.0 - 15.0 g/dL   HCT 16.6  06.3 - 01.6 %   MCV 89.1  78.0 - 100.0 fL   MCH 29.3  26.0 - 34.0 pg   MCHC 32.9  30.0 - 36.0 g/dL   RDW 01.0  93.2 - 35.5 %   Platelets 239  150 - 400 K/uL  COMPREHENSIVE METABOLIC PANEL      Result Value Range   Sodium 140  135 - 145 mEq/L   Potassium 3.5  3.5 - 5.1 mEq/L   Chloride 103  96 - 112 mEq/L   CO2 28  19 - 32 mEq/L   Glucose, Bld 120 (*) 70 - 99 mg/dL   BUN 20  6 - 23 mg/dL   Creatinine, Ser 7.32  0.50 - 1.10 mg/dL   Calcium 9.4  8.4 - 20.2 mg/dL   Total Protein 7.2  6.0 - 8.3 g/dL   Albumin 3.8  3.5 - 5.2 g/dL   AST 21  0 - 37 U/L   ALT 13  0 - 35 U/L   Alkaline Phosphatase 60  39 - 117 U/L   Total Bilirubin 0.3  0.3 - 1.2 mg/dL   GFR calc non Af Amer 85 (*) >90 mL/min   GFR calc Af Amer >90  >90 mL/min  TROPONIN I  Result Value Range   Troponin I <0.30  <0.30 ng/mL   Filed Vitals:   02/24/13 1810  BP: 146/74  Pulse: 76  Temp: 97.9 F (36.6 C)  Resp: 20    Date: 02/24/2013  Rate: 79  Rhythm: normal sinus rhythm  QRS Axis: normal  Intervals: normal  ST/T Wave abnormalities: normal  Conduction Disutrbances:poor r wave progression  Narrative Interpretation:   Old EKG Reviewed: none available and unchanged 2/13 except pvcs resolved.    MDM  72 y.o. Female with left upper back pain radiating to left lateral chest for several days.  She has normal vitals except mild hypertension.  EKG without acute abnormality and troponin normal.  Pain is ongoing for days.  It has not worsened.  SHe has no dyspnea or tachypnea.  I have low index of suspicion for pe, dissection or aaa.  She is  given strict return precautions and is advised close follow up with Dr. Sudie Bailey.   Hilario Quarry, MD 02/24/13 2026

## 2013-02-24 NOTE — ED Notes (Signed)
Patient ambulated to restroom with assist tolerated well. 

## 2013-03-18 ENCOUNTER — Other Ambulatory Visit (HOSPITAL_COMMUNITY): Payer: Self-pay | Admitting: Family Medicine

## 2013-03-18 DIAGNOSIS — Z139 Encounter for screening, unspecified: Secondary | ICD-10-CM

## 2013-04-28 ENCOUNTER — Ambulatory Visit (HOSPITAL_COMMUNITY)
Admission: RE | Admit: 2013-04-28 | Discharge: 2013-04-28 | Disposition: A | Discharge status: Home / Self Care | Payer: Medicare Other | Source: Ambulatory Visit | Attending: Family Medicine | Admitting: Family Medicine

## 2013-04-28 DIAGNOSIS — Z139 Encounter for screening, unspecified: Secondary | ICD-10-CM

## 2013-04-28 DIAGNOSIS — Z1231 Encounter for screening mammogram for malignant neoplasm of breast: Secondary | ICD-10-CM | POA: Insufficient documentation

## 2013-10-27 ENCOUNTER — Other Ambulatory Visit (HOSPITAL_COMMUNITY): Payer: Self-pay | Admitting: Orthopaedic Surgery

## 2013-10-27 DIAGNOSIS — M25569 Pain in unspecified knee: Secondary | ICD-10-CM

## 2013-10-30 ENCOUNTER — Ambulatory Visit (HOSPITAL_COMMUNITY)
Admission: RE | Admit: 2013-10-30 | Discharge: 2013-10-30 | Disposition: A | Discharge status: Home / Self Care | Payer: Medicare Other | Source: Ambulatory Visit | Attending: Orthopaedic Surgery | Admitting: Orthopaedic Surgery

## 2013-10-30 ENCOUNTER — Encounter (HOSPITAL_COMMUNITY): Payer: Self-pay

## 2013-10-30 DIAGNOSIS — M1710 Unilateral primary osteoarthritis, unspecified knee: Secondary | ICD-10-CM | POA: Insufficient documentation

## 2013-10-30 DIAGNOSIS — X58XXXA Exposure to other specified factors, initial encounter: Secondary | ICD-10-CM | POA: Insufficient documentation

## 2013-10-30 DIAGNOSIS — M25569 Pain in unspecified knee: Secondary | ICD-10-CM

## 2013-10-30 DIAGNOSIS — S83289A Other tear of lateral meniscus, current injury, unspecified knee, initial encounter: Secondary | ICD-10-CM | POA: Insufficient documentation

## 2013-10-30 DIAGNOSIS — R29898 Other symptoms and signs involving the musculoskeletal system: Secondary | ICD-10-CM | POA: Insufficient documentation

## 2014-01-21 ENCOUNTER — Other Ambulatory Visit (HOSPITAL_COMMUNITY): Payer: Self-pay | Admitting: Internal Medicine

## 2014-01-21 ENCOUNTER — Ambulatory Visit (HOSPITAL_COMMUNITY)
Admission: RE | Admit: 2014-01-21 | Discharge: 2014-01-21 | Disposition: A | Discharge status: Home / Self Care | Payer: Medicare Other | Source: Ambulatory Visit | Attending: Internal Medicine | Admitting: Internal Medicine

## 2014-01-21 DIAGNOSIS — M546 Pain in thoracic spine: Secondary | ICD-10-CM

## 2014-01-21 DIAGNOSIS — M545 Low back pain, unspecified: Secondary | ICD-10-CM

## 2014-01-21 DIAGNOSIS — M5137 Other intervertebral disc degeneration, lumbosacral region: Secondary | ICD-10-CM | POA: Insufficient documentation

## 2014-01-21 DIAGNOSIS — M51379 Other intervertebral disc degeneration, lumbosacral region without mention of lumbar back pain or lower extremity pain: Secondary | ICD-10-CM | POA: Insufficient documentation

## 2014-03-09 ENCOUNTER — Other Ambulatory Visit (HOSPITAL_COMMUNITY): Payer: Self-pay | Admitting: Family Medicine

## 2014-03-09 DIAGNOSIS — Z1231 Encounter for screening mammogram for malignant neoplasm of breast: Secondary | ICD-10-CM

## 2014-03-09 DIAGNOSIS — Z139 Encounter for screening, unspecified: Secondary | ICD-10-CM

## 2014-04-23 ENCOUNTER — Ambulatory Visit (HOSPITAL_COMMUNITY)
Admission: RE | Admit: 2014-04-23 | Discharge: 2014-04-23 | Disposition: A | Discharge status: Home / Self Care | Payer: Medicare Other | Source: Ambulatory Visit | Attending: Family Medicine | Admitting: Family Medicine

## 2014-04-23 DIAGNOSIS — Z1231 Encounter for screening mammogram for malignant neoplasm of breast: Secondary | ICD-10-CM | POA: Diagnosis not present

## 2014-07-17 ENCOUNTER — Other Ambulatory Visit (HOSPITAL_COMMUNITY): Payer: Self-pay | Admitting: Family Medicine

## 2014-07-17 ENCOUNTER — Ambulatory Visit (HOSPITAL_COMMUNITY)
Admission: RE | Admit: 2014-07-17 | Discharge: 2014-07-17 | Disposition: A | Discharge status: Home / Self Care | Payer: Medicare Other | Source: Ambulatory Visit | Attending: Family Medicine | Admitting: Family Medicine

## 2014-07-17 DIAGNOSIS — M549 Dorsalgia, unspecified: Secondary | ICD-10-CM

## 2014-07-17 DIAGNOSIS — M8588 Other specified disorders of bone density and structure, other site: Secondary | ICD-10-CM | POA: Insufficient documentation

## 2014-07-17 DIAGNOSIS — M419 Scoliosis, unspecified: Secondary | ICD-10-CM | POA: Insufficient documentation

## 2014-07-17 DIAGNOSIS — R0989 Other specified symptoms and signs involving the circulatory and respiratory systems: Secondary | ICD-10-CM

## 2014-07-17 DIAGNOSIS — M899 Disorder of bone, unspecified: Secondary | ICD-10-CM

## 2014-07-23 ENCOUNTER — Ambulatory Visit (HOSPITAL_COMMUNITY)
Admission: RE | Admit: 2014-07-23 | Discharge: 2014-07-23 | Disposition: A | Discharge status: Home / Self Care | Payer: Medicare Other | Source: Ambulatory Visit | Attending: Family Medicine | Admitting: Family Medicine

## 2014-07-23 DIAGNOSIS — M899 Disorder of bone, unspecified: Secondary | ICD-10-CM | POA: Diagnosis not present

## 2014-07-23 DIAGNOSIS — Z780 Asymptomatic menopausal state: Secondary | ICD-10-CM | POA: Insufficient documentation

## 2014-07-29 ENCOUNTER — Encounter (INDEPENDENT_AMBULATORY_CARE_PROVIDER_SITE_OTHER): Payer: Self-pay | Admitting: *Deleted

## 2014-08-03 ENCOUNTER — Ambulatory Visit (HOSPITAL_COMMUNITY)
Admission: RE | Admit: 2014-08-03 | Discharge: 2014-08-03 | Disposition: A | Discharge status: Home / Self Care | Payer: Medicare Other | Source: Ambulatory Visit | Attending: Family Medicine | Admitting: Family Medicine

## 2014-08-03 ENCOUNTER — Other Ambulatory Visit (HOSPITAL_COMMUNITY): Payer: Self-pay | Admitting: Family Medicine

## 2014-08-03 DIAGNOSIS — M5125 Other intervertebral disc displacement, thoracolumbar region: Secondary | ICD-10-CM | POA: Insufficient documentation

## 2014-08-03 DIAGNOSIS — R52 Pain, unspecified: Secondary | ICD-10-CM

## 2014-08-03 DIAGNOSIS — M546 Pain in thoracic spine: Secondary | ICD-10-CM | POA: Insufficient documentation

## 2014-08-03 DIAGNOSIS — M5134 Other intervertebral disc degeneration, thoracic region: Secondary | ICD-10-CM | POA: Insufficient documentation

## 2014-08-10 ENCOUNTER — Other Ambulatory Visit (HOSPITAL_COMMUNITY): Payer: Self-pay | Admitting: Family Medicine

## 2014-08-10 DIAGNOSIS — R1013 Epigastric pain: Secondary | ICD-10-CM

## 2014-08-10 DIAGNOSIS — R634 Abnormal weight loss: Secondary | ICD-10-CM

## 2014-08-11 ENCOUNTER — Ambulatory Visit (HOSPITAL_COMMUNITY)
Admission: RE | Admit: 2014-08-11 | Discharge: 2014-08-11 | Disposition: A | Discharge status: Home / Self Care | Payer: Medicare Other | Source: Ambulatory Visit | Attending: Family Medicine | Admitting: Family Medicine

## 2014-08-11 DIAGNOSIS — Z90710 Acquired absence of both cervix and uterus: Secondary | ICD-10-CM | POA: Diagnosis not present

## 2014-08-11 DIAGNOSIS — R634 Abnormal weight loss: Secondary | ICD-10-CM | POA: Diagnosis not present

## 2014-08-11 DIAGNOSIS — R110 Nausea: Secondary | ICD-10-CM | POA: Insufficient documentation

## 2014-08-11 DIAGNOSIS — R1013 Epigastric pain: Secondary | ICD-10-CM | POA: Insufficient documentation

## 2014-08-11 LAB — POCT I-STAT CREATININE: Creatinine, Ser: 0.6 mg/dL (ref 0.50–1.10)

## 2014-08-11 MED ORDER — IOHEXOL 300 MG/ML  SOLN
80.0000 mL | Freq: Once | INTRAMUSCULAR | Status: AC | PRN
  Administered 2014-08-11: 100 mL via INTRAVENOUS

## 2014-08-13 ENCOUNTER — Other Ambulatory Visit (INDEPENDENT_AMBULATORY_CARE_PROVIDER_SITE_OTHER): Payer: Self-pay | Admitting: *Deleted

## 2014-08-13 DIAGNOSIS — R1013 Epigastric pain: Principal | ICD-10-CM

## 2014-08-13 DIAGNOSIS — R634 Abnormal weight loss: Secondary | ICD-10-CM

## 2014-08-13 DIAGNOSIS — G8929 Other chronic pain: Secondary | ICD-10-CM

## 2014-08-14 ENCOUNTER — Encounter (HOSPITAL_COMMUNITY): Payer: Self-pay | Admitting: *Deleted

## 2014-08-14 ENCOUNTER — Ambulatory Visit (HOSPITAL_COMMUNITY)
Admission: RE | Admit: 2014-08-14 | Discharge: 2014-08-14 | Disposition: A | Discharge status: Home / Self Care | Payer: Medicare Other | Source: Ambulatory Visit | Attending: Internal Medicine | Admitting: Internal Medicine

## 2014-08-14 ENCOUNTER — Encounter (HOSPITAL_COMMUNITY)
Admission: RE | Disposition: A | Discharge status: Home / Self Care | Payer: Self-pay | Source: Ambulatory Visit | Attending: Internal Medicine

## 2014-08-14 DIAGNOSIS — R634 Abnormal weight loss: Secondary | ICD-10-CM

## 2014-08-14 DIAGNOSIS — K210 Gastro-esophageal reflux disease with esophagitis: Secondary | ICD-10-CM | POA: Diagnosis not present

## 2014-08-14 DIAGNOSIS — G8929 Other chronic pain: Secondary | ICD-10-CM

## 2014-08-14 DIAGNOSIS — Z886 Allergy status to analgesic agent status: Secondary | ICD-10-CM | POA: Diagnosis not present

## 2014-08-14 DIAGNOSIS — E785 Hyperlipidemia, unspecified: Secondary | ICD-10-CM | POA: Insufficient documentation

## 2014-08-14 DIAGNOSIS — I10 Essential (primary) hypertension: Secondary | ICD-10-CM | POA: Insufficient documentation

## 2014-08-14 DIAGNOSIS — Z90710 Acquired absence of both cervix and uterus: Secondary | ICD-10-CM | POA: Diagnosis not present

## 2014-08-14 DIAGNOSIS — K2970 Gastritis, unspecified, without bleeding: Secondary | ICD-10-CM | POA: Insufficient documentation

## 2014-08-14 DIAGNOSIS — Z7982 Long term (current) use of aspirin: Secondary | ICD-10-CM | POA: Insufficient documentation

## 2014-08-14 DIAGNOSIS — K208 Other esophagitis: Secondary | ICD-10-CM

## 2014-08-14 DIAGNOSIS — R1013 Epigastric pain: Secondary | ICD-10-CM

## 2014-08-14 DIAGNOSIS — R110 Nausea: Secondary | ICD-10-CM

## 2014-08-14 HISTORY — PX: ESOPHAGOGASTRODUODENOSCOPY: SHX5428

## 2014-08-14 SURGERY — EGD (ESOPHAGOGASTRODUODENOSCOPY)
Anesthesia: Moderate Sedation

## 2014-08-14 MED ORDER — MIDAZOLAM HCL 5 MG/5ML IJ SOLN
INTRAMUSCULAR | Status: AC
  Filled 2014-08-14: qty 10

## 2014-08-14 MED ORDER — PANTOPRAZOLE SODIUM 40 MG PO TBEC: 40.0000 mg | DELAYED_RELEASE_TABLET | Freq: Every day | ORAL | Status: DC

## 2014-08-14 MED ORDER — SUCRALFATE 1 G PO TABS: 2.0000 g | ORAL_TABLET | Freq: Every day | ORAL | Status: DC

## 2014-08-14 MED ORDER — SIMETHICONE 40 MG/0.6ML PO SUSP
ORAL | Status: DC | PRN
  Administered 2014-08-14: 13:00:00

## 2014-08-14 MED ORDER — MEPERIDINE HCL 50 MG/ML IJ SOLN
INTRAMUSCULAR | Status: DC | PRN
  Administered 2014-08-14 (×2): 25 mg via INTRAVENOUS

## 2014-08-14 MED ORDER — SODIUM CHLORIDE 0.9 % IV SOLN
INTRAVENOUS | Status: DC
  Administered 2014-08-14: 1000 mL via INTRAVENOUS

## 2014-08-14 MED ORDER — MEPERIDINE HCL 50 MG/ML IJ SOLN
INTRAMUSCULAR | Status: AC
  Filled 2014-08-14: qty 1

## 2014-08-14 MED ORDER — LORAZEPAM 0.5 MG PO TABS: 0.5000 mg | ORAL_TABLET | Freq: Two times a day (BID) | ORAL | Status: DC | PRN

## 2014-08-14 MED ORDER — MIDAZOLAM HCL 5 MG/5ML IJ SOLN
INTRAMUSCULAR | Status: DC | PRN
  Administered 2014-08-14: 1 mg via INTRAVENOUS
  Administered 2014-08-14 (×2): 2 mg via INTRAVENOUS

## 2014-08-14 MED ORDER — BUTAMBEN-TETRACAINE-BENZOCAINE 2-2-14 % EX AERO
INHALATION_SPRAY | CUTANEOUS | Status: DC | PRN
  Administered 2014-08-14: 2 via TOPICAL

## 2014-08-14 NOTE — Op Note (Signed)
EGD PROCEDURE REPORT  PATIENT:  Anne ComptonJean T McGehee  MR#:  [id number] Birthdate:  13-Sep-1940, 74 y.o., female Endoscopist:  Dr. Malissa HippoNajeeb U. Khoi Hamberger, MD Referred By:  Dr. Milana ObeyStephen D Knowlton, MD Procedure Date: 08/14/2014  Procedure:   EGD  Indications:  Patient is 74 year old Caucasian female who presents with 7 week history of nausea anorexia associated 20 pound weight loss as well as epigastric and lower abdominal pain. Symptoms started when she had flu leading into pneumonia. She stopped her NSAID one month ago but does not feel better. She had abdominopelvic CT earlier this week and is negative for pancreatic disease or adenopathy.            Informed Consent:  The risks, benefits, alternatives & imponderables which include, but are not limited to, bleeding, infection, perforation, drug reaction and potential missed lesion have been reviewed.  The potential for biopsy, lesion removal, esophageal dilation, etc. have also been discussed.  Questions have been answered.  All parties agreeable.  Please see history & physical in medical record for more information.  Medications:  Demerol 50 mg IV Versed 5 mg IV Cetacaine spray topically for oropharyngeal anesthesia  Description of procedure:  The endoscope was introduced through the mouth and advanced to the second portion of the duodenum without difficulty or limitations. The mucosal surfaces were surveyed very carefully during advancement of the scope and upon withdrawal.  Findings:  Esophagus:  Mucosa of the esophagus was normal. Focal edema and erythema noted at GE junction. No ring or stricture present. GEJ:  37 cm Stomach:  There was bile in the stomach but no food debris. Stomach distended very well with insufflation. Folds in the proximal stomach were normal. Examination mucosa and body was normal. There is patchy antral erythema with single erosion but pyloric channel was patent. Angularis fundus and cardia were unremarkable. Duodenum:  Normal  bulbar and post bulbar mucosa.  Therapeutic/Diagnostic Maneuvers Performed:  None  Complications:  None  Impression: No evidence of peptic ulcer disease. Mild changes of reflux esophagitis limited to GE junction. Antral gastritis(please note H. pylori stool antigen has been performed by Dr. Sudie BaileyKnowlton and result is pending). Duodenal gastric bile reflux.  Recommendations:  Pantoprazole 40 mg by mouth every morning. Sucralfate 2 g by mouth daily at bedtime. Lorazepam 0.5 mg by mouth twice a day when necessary. Prescription given for 60 doses without a refill. Office visit in 4 weeks.  Adaeze Better U  08/14/2014  1:26 PM  CC: Dr. Milana ObeyKNOWLTON,STEPHEN D, MD & Dr. Bonnetta BarryNo ref. provider found

## 2014-08-14 NOTE — H&P (Signed)
Anne ComptonJean T Davenport is an 74 y.o. female.   Chief Complaint: Patient is here for EGD. HPI: Patient is 74 year old Caucasian female who was in usual state of health until about 7 weeks ago when she developed flu followed by pneumonia. She was treated with Augmentin. Since then she's had nausea and anorexia and has lost 20 pounds. She is also experience abdominal pain. She had been Naprosyn for over a year which was discontinued 4 weeks ago by Dr. Sudie BaileyKnowlton but symptomatically she is not feeling any better. She had abdominopelvic CT earlier this week and was negative for adenopathy or pancreatic mass. She has been under a lot of stress because of her husband's illness was recurrent lymphoma. She denies fever chills night sweats melena or rectal bleeding.  Past Medical History  Diagnosis Date  . Hypertension   . Hyperlipemia     Past Surgical History  Procedure Laterality Date  . Abdominal hysterectomy    . Tonsillectomy    . Bladder repair      History reviewed. No pertinent family history. Social History:  reports that she has never smoked. She does not have any smokeless tobacco history on file. She reports that she drinks alcohol. Her drug history is not on file.  Allergies:  Allergies  Allergen Reactions  . Codeine Itching and Nausea And Vomiting  . Molds & Smuts     Medications Prior to Admission  Medication Sig Dispense Refill  . acetaminophen (TYLENOL) 500 MG tablet Take 1,000 mg by mouth once as needed for pain.    Marland Kitchen. aspirin EC 81 MG tablet Take 324 mg by mouth once.    Marland Kitchen. atorvastatin (LIPITOR) 10 MG tablet Take 10 mg by mouth at bedtime.      Marland Kitchen. azelastine (ASTELIN) 137 MCG/SPRAY nasal spray Place 2 sprays into the nose 2 (two) times daily. Use in each nostril as directed    . Calcium Carbonate-Vitamin D (CALCIUM 600 + D PO) Take 1 tablet by mouth daily.      . cholecalciferol (VITAMIN D) 400 UNITS TABS Take 400 Units by mouth daily.      . fluticasone (FLONASE) 50 MCG/ACT nasal  spray Place 2 sprays into the nose daily as needed. For nasal congestion     . pseudoephedrine (SUDAFED) 30 MG tablet Take 30 mg by mouth every 4 (four) hours as needed for congestion.    Marland Kitchen. SALINE NASAL MIST NA Place 1-2 sprays into the nose daily as needed. For congestion     . SUMAtriptan (IMITREX) 100 MG tablet Take 100 mg by mouth as needed. For migraines     . albuterol (PROVENTIL HFA;VENTOLIN HFA) 108 (90 BASE) MCG/ACT inhaler Inhale 1-2 puffs into the lungs every 6 (six) hours as needed for wheezing. 1 Inhaler 0  . estradiol (CLIMARA - DOSED IN MG/24 HR) 0.1 mg/24hr Place 1 patch onto the skin once a week.      . naproxen (NAPROSYN) 500 MG tablet Take 500 mg by mouth 2 (two) times daily with a meal. For ARTHRITIS       No results found for this or any previous visit (from the past 48 hour(s)). No results found.  ROS  Blood pressure 170/58, pulse 98, temperature 97.4 F (36.3 C), temperature source Oral, resp. rate 20, SpO2 96 %. Physical Exam  Constitutional: She appears well-developed and well-nourished.  HENT:  Mouth/Throat: Oropharynx is clear and moist.  Eyes: Conjunctivae are normal. No scleral icterus.  Neck: No thyromegaly present.  Cardiovascular: Normal  rate, regular rhythm and normal heart sounds.   No murmur heard. Respiratory: Effort normal and breath sounds normal.  GI:  Soft abdomen with mild generalized tenderness which is most pronounced in epigastric region. No organomegaly or masses.  Musculoskeletal: She exhibits no edema.  Lymphadenopathy:    She has no cervical adenopathy.  Neurological: She is alert.  Skin: Skin is warm and dry.     Assessment/Plan Nausea anorexia weight loss and epigastric pain. Diagnostic EGD.  REHMAN,NAJEEB U 08/14/2014, 1:00 PM

## 2014-08-14 NOTE — Discharge Instructions (Signed)
Resume usual medications but do not take Naprosyn or similar medications other than baby aspirin daily. Resume usual diet. Pantoprazole 40 mg by mouth 30 minutes before breakfast daily. Sucralfate 2 g by mouth daily at bedtime. Lorazepam 0.5 mg by mouth twice daily as needed. Office visit in 4 weeks.      Esophagogastroduodenoscopy Care After Refer to this sheet in the next few weeks. These instructions provide you with information on caring for yourself after your procedure. Your caregiver may also give you more specific instructions. Your treatment has been planned according to current medical practices, but problems sometimes occur. Call your caregiver if you have any problems or questions after your procedure.  HOME CARE INSTRUCTIONS  Do not eat or drink anything until the numbing medicine (local anesthetic) has worn off and your gag reflex has returned. You will know that the local anesthetic has worn off when you can swallow comfortably.  Do not drive for 12 hours after the procedure or as directed by your caregiver.  Only take medicines as directed by your caregiver. SEEK MEDICAL CARE IF:   You cannot stop coughing.  You are not urinating at all or less than usual. SEEK IMMEDIATE MEDICAL CARE IF:  You have difficulty swallowing.  You cannot eat or drink.  You have worsening throat or chest pain.  You have dizziness, lightheadedness, or you faint.  You have nausea or vomiting.  You have chills.  You have a fever.  You have severe abdominal pain.  You have black, tarry, or bloody stools. Document Released: 06/12/2012 Document Reviewed: 06/12/2012 [facility] Patient Information 2015 [place], [place]. This information is not intended to replace advice given to you by your health care provider. Make sure you discuss any questions you have with your health care provider.

## 2014-08-17 ENCOUNTER — Encounter (HOSPITAL_COMMUNITY): Payer: Self-pay | Admitting: Internal Medicine

## 2014-08-27 ENCOUNTER — Encounter (INDEPENDENT_AMBULATORY_CARE_PROVIDER_SITE_OTHER): Payer: Self-pay

## 2014-09-01 ENCOUNTER — Ambulatory Visit (INDEPENDENT_AMBULATORY_CARE_PROVIDER_SITE_OTHER): Payer: Medicare Other | Admitting: Internal Medicine

## 2014-09-01 ENCOUNTER — Other Ambulatory Visit (HOSPITAL_COMMUNITY): Payer: Self-pay | Admitting: Family Medicine

## 2014-09-01 DIAGNOSIS — R1084 Generalized abdominal pain: Secondary | ICD-10-CM

## 2014-09-03 ENCOUNTER — Ambulatory Visit (HOSPITAL_COMMUNITY)
Admission: RE | Admit: 2014-09-03 | Discharge: 2014-09-03 | Disposition: A | Discharge status: Home / Self Care | Payer: Medicare Other | Source: Ambulatory Visit | Attending: Family Medicine | Admitting: Family Medicine

## 2014-09-03 ENCOUNTER — Ambulatory Visit (HOSPITAL_COMMUNITY): Admission: RE | Admit: 2014-09-03 | Payer: Medicare Other | Source: Ambulatory Visit

## 2014-09-03 DIAGNOSIS — R1084 Generalized abdominal pain: Secondary | ICD-10-CM | POA: Insufficient documentation

## 2014-09-04 ENCOUNTER — Other Ambulatory Visit (HOSPITAL_COMMUNITY): Payer: Self-pay | Admitting: Family Medicine

## 2014-09-04 DIAGNOSIS — R7989 Other specified abnormal findings of blood chemistry: Secondary | ICD-10-CM

## 2014-09-07 ENCOUNTER — Encounter (INDEPENDENT_AMBULATORY_CARE_PROVIDER_SITE_OTHER): Payer: Self-pay | Admitting: Internal Medicine

## 2014-09-07 ENCOUNTER — Other Ambulatory Visit (INDEPENDENT_AMBULATORY_CARE_PROVIDER_SITE_OTHER): Payer: Self-pay | Admitting: *Deleted

## 2014-09-07 ENCOUNTER — Telehealth (INDEPENDENT_AMBULATORY_CARE_PROVIDER_SITE_OTHER): Payer: Self-pay | Admitting: *Deleted

## 2014-09-07 ENCOUNTER — Ambulatory Visit (INDEPENDENT_AMBULATORY_CARE_PROVIDER_SITE_OTHER): Payer: Medicare Other | Admitting: Internal Medicine

## 2014-09-07 VITALS — BP 112/80 | HR 72 | Temp 97.8°F | Ht 63.5 in | Wt 117.8 lb

## 2014-09-07 DIAGNOSIS — R110 Nausea: Secondary | ICD-10-CM

## 2014-09-07 DIAGNOSIS — R109 Unspecified abdominal pain: Secondary | ICD-10-CM

## 2014-09-07 DIAGNOSIS — R1030 Lower abdominal pain, unspecified: Secondary | ICD-10-CM

## 2014-09-07 DIAGNOSIS — E7800 Pure hypercholesterolemia, unspecified: Secondary | ICD-10-CM | POA: Insufficient documentation

## 2014-09-07 DIAGNOSIS — Z1211 Encounter for screening for malignant neoplasm of colon: Secondary | ICD-10-CM

## 2014-09-07 MED ORDER — PEG-KCL-NACL-NASULF-NA ASC-C 100 G PO SOLR: 1.0000 | Freq: Once | ORAL | Status: DC

## 2014-09-07 NOTE — Progress Notes (Signed)
Subjective:    Patient ID: Anne ComptonJean T Davenport, female    DOB: 1941-07-06, 74 y.o.   MRN: [id number]    HPI Here today for f/u after undergoing an EGD for nausea and anorexia associated with a 20 pound weight loss and well as epigastric and lower abdominal pain.  Back in December she had the flu and went into pneumonia. Then she developed shingles.  She is feeling better. Her appetite is not better. She is eating but not a lot. She is keeping foods down. She says foods are just not tasting right.   Her normal weight is 142. Today her weight is 117.  She has lost about 25 pounds since December. She continues to have some abdominal pain. She points to her umbilicus and lower abdomen.  She has a BM about every day. Normal size. No melena or BRRB. She is having blood work done today for Dr. Sudie BaileyKnowlton for her thyroid. She has an elevated TSH. Also scan of her thyroid this Thursday.   Husband has lymphoma and is in Hospice care now. Procedure Date: 08/14/2014 EGD:  Indications:  Patient is 74 year old Caucasian female who presents with 7 week history of nausea anorexia associated 20 pound weight loss as well as epigastric and lower abdominal pain. Symptoms started when she had flu leading into pneumonia. She stopped her NSAID one month ago but does not feel better. She had abdominopelvic CT earlier this week and is negative for pancreatic disease or adenopathy.       Impression: No evidence of peptic ulcer disease. Mild changes of reflux esophagitis limited to GE junction. Antral gastritis(please note H. pylori stool antigen has been performed by Dr. Sudie BaileyKnowlton and result is pending). Duodenal gastric bile reflux.  09/03/2014 US RUQ: IMPRESSION: 1. No acute findings. Specifically, no gallstones and no findings to suggest acute cholecystitis at this time. Normal examination.   08/11/2014 CT abdomen/pelvis with CM:  IMPRESSION: 1. No acute findings. Specifically, no gallstones and no findings to suggest  acute cholecystitis at this time. Normal examination.  08/11/2014 Albumin 3.9, Bili 0.6, ALP 78, AST 19, ALT 40, H and H 13.0 and 40.8, MCV 84.3, Platelet ct 193 TSH 29.      Review of Systems Married. Three children. One daughter has had lyphoma and breast cancer but is in remission.       Past Medical History  Diagnosis Date  . Hypertension   . Hyperlipemia     Past Surgical History  Procedure Laterality Date  . Abdominal hysterectomy    . Tonsillectomy    . Bladder repair    . Esophagogastroduodenoscopy N/A 08/14/2014    Procedure: ESOPHAGOGASTRODUODENOSCOPY (EGD);  Surgeon: Malissa HippoNajeeb U Rehman, MD;  Location: [facility];  Service: Endoscopy;  Laterality: N/A;  1:10    Allergies  Allergen Reactions  . Codeine Itching and Nausea And Vomiting  . Molds & Smuts     Current Outpatient Prescriptions on File Prior to Visit  Medication Sig Dispense Refill  . acetaminophen (TYLENOL) 500 MG tablet Take 1,000 mg by mouth once as needed for pain.    Marland Kitchen. aspirin EC 81 MG tablet Take 324 mg by mouth once.    Marland Kitchen. atorvastatin (LIPITOR) 10 MG tablet Take 10 mg by mouth at bedtime.      Marland Kitchen. azelastine (ASTELIN) 137 MCG/SPRAY nasal spray Place 2 sprays into the nose 2 (two) times daily. Use in each nostril as directed    . Calcium Carbonate-Vitamin D (CALCIUM 600 + D  PO) Take 1 tablet by mouth daily.      . cholecalciferol (VITAMIN D) 400 UNITS TABS Take 400 Units by mouth daily.      Marland Kitchen estradiol (CLIMARA - DOSED IN MG/24 HR) 0.1 mg/24hr Place 1 patch onto the skin once a week.      . fluticasone (FLONASE) 50 MCG/ACT nasal spray Place 2 sprays into the nose daily as needed. For nasal congestion     . LORazepam (ATIVAN) 0.5 MG tablet Take 1 tablet (0.5 mg total) by mouth 2 (two) times daily as needed for anxiety. 60 tablet 0  . pantoprazole (PROTONIX) 40 MG tablet Take 1 tablet (40 mg total) by mouth daily before breakfast. 30 tablet 5  . pseudoephedrine (SUDAFED) 30 MG tablet Take 30 mg by mouth  every 4 (four) hours as needed for congestion.    Marland Kitchen SALINE NASAL MIST NA Place 1-2 sprays into the nose daily as needed. For congestion     . sucralfate (CARAFATE) 1 G tablet Take 2 tablets (2 g total) by mouth at bedtime. 60 tablet 0  . SUMAtriptan (IMITREX) 100 MG tablet Take 100 mg by mouth as needed. For migraines     . albuterol (PROVENTIL HFA;VENTOLIN HFA) 108 (90 BASE) MCG/ACT inhaler Inhale 1-2 puffs into the lungs every 6 (six) hours as needed for wheezing. 1 Inhaler 0   No current facility-administered medications on file prior to visit.        Objective:   Physical Exam Blood pressure 112/80, pulse 72, temperature 97.8 F (36.6 C), height 5' 3.5" (1.613 m), weight 117 lb 12.8 oz (53.434 kg). Alert and oriented. Skin warm and dry. Oral mucosa is moist.   . Sclera anicteric, conjunctivae is pink. Thyroid not enlarged. No cervical lymphadenopathy. Lungs clear. Heart regular rate and rhythm.  Abdomen is soft. Bowel sounds are positive. No hepatomegaly. No abdominal masses felt. Slight rt lower quadrant tenderness.  No edema to lower extremities     Assessment & Plan:  Nausea, Abdominal pain. So far her work up has been negative. She has had weight loss after having pneumonia December. Will schedule a colonoscopy with Dr Karilyn Cota to rule out colonic carcinoma. Thyroid studies per Dr. Sudie Bailey.

## 2014-09-07 NOTE — Patient Instructions (Signed)
Colonoscopy.  The risks and benefits such as perforation, bleeding, and infection were reviewed with the patient and is agreeable. 

## 2014-09-07 NOTE — Telephone Encounter (Signed)
Patient needs movi prep 

## 2014-09-09 ENCOUNTER — Encounter (HOSPITAL_COMMUNITY): Payer: Self-pay

## 2014-09-09 ENCOUNTER — Encounter (HOSPITAL_COMMUNITY)
Admission: RE | Admit: 2014-09-09 | Discharge: 2014-09-09 | Disposition: A | Discharge status: Home / Self Care | Payer: Medicare Other | Source: Ambulatory Visit | Attending: Family Medicine | Admitting: Family Medicine

## 2014-09-09 DIAGNOSIS — R946 Abnormal results of thyroid function studies: Secondary | ICD-10-CM | POA: Diagnosis present

## 2014-09-09 DIAGNOSIS — R7989 Other specified abnormal findings of blood chemistry: Secondary | ICD-10-CM

## 2014-09-09 MED ORDER — SODIUM IODIDE I 131 CAPSULE
13.0000 | Freq: Once | INTRAVENOUS | Status: AC | PRN
  Administered 2014-09-09: 13 via ORAL

## 2014-09-10 ENCOUNTER — Encounter (HOSPITAL_COMMUNITY)
Admission: RE | Admit: 2014-09-10 | Discharge: 2014-09-10 | Disposition: A | Discharge status: Home / Self Care | Payer: Medicare Other | Source: Ambulatory Visit | Attending: Family Medicine | Admitting: Family Medicine

## 2014-09-10 ENCOUNTER — Encounter (HOSPITAL_COMMUNITY): Payer: Self-pay

## 2014-09-10 MED ORDER — SODIUM PERTECHNETATE TC 99M INJECTION
10.0000 | Freq: Once | INTRAVENOUS | Status: AC | PRN
  Administered 2014-09-10: 10 via INTRAVENOUS

## 2014-09-11 ENCOUNTER — Encounter (HOSPITAL_COMMUNITY): Admission: RE | Payer: Self-pay | Source: Ambulatory Visit

## 2014-09-11 ENCOUNTER — Ambulatory Visit (HOSPITAL_COMMUNITY): Admission: RE | Admit: 2014-09-11 | Payer: Medicare Other | Source: Ambulatory Visit | Admitting: Internal Medicine

## 2014-09-11 ENCOUNTER — Other Ambulatory Visit (HOSPITAL_COMMUNITY): Payer: Self-pay | Admitting: "Endocrinology

## 2014-09-11 ENCOUNTER — Encounter (HOSPITAL_COMMUNITY)
Admission: RE | Admit: 2014-09-11 | Discharge: 2014-09-11 | Disposition: A | Discharge status: Home / Self Care | Payer: Medicare Other | Source: Ambulatory Visit | Attending: "Endocrinology | Admitting: "Endocrinology

## 2014-09-11 DIAGNOSIS — E0500 Thyrotoxicosis with diffuse goiter without thyrotoxic crisis or storm: Secondary | ICD-10-CM | POA: Insufficient documentation

## 2014-09-11 DIAGNOSIS — E0590 Thyrotoxicosis, unspecified without thyrotoxic crisis or storm: Secondary | ICD-10-CM

## 2014-09-11 SURGERY — COLONOSCOPY
Anesthesia: Moderate Sedation

## 2014-09-16 ENCOUNTER — Ambulatory Visit (HOSPITAL_COMMUNITY): Payer: Medicare Other

## 2014-12-18 LAB — TSH: TSH: 14.87 u[IU]/mL — AB (ref <=5.90)

## 2015-03-23 ENCOUNTER — Other Ambulatory Visit (HOSPITAL_COMMUNITY): Payer: Self-pay | Admitting: Family Medicine

## 2015-03-23 DIAGNOSIS — Z1231 Encounter for screening mammogram for malignant neoplasm of breast: Secondary | ICD-10-CM

## 2015-03-26 ENCOUNTER — Encounter: Payer: Self-pay | Admitting: "Endocrinology

## 2015-04-01 ENCOUNTER — Other Ambulatory Visit (INDEPENDENT_AMBULATORY_CARE_PROVIDER_SITE_OTHER): Payer: Self-pay | Admitting: *Deleted

## 2015-04-01 DIAGNOSIS — Z1211 Encounter for screening for malignant neoplasm of colon: Secondary | ICD-10-CM

## 2015-04-09 ENCOUNTER — Ambulatory Visit (INDEPENDENT_AMBULATORY_CARE_PROVIDER_SITE_OTHER): Payer: Medicare Other | Admitting: "Endocrinology

## 2015-04-09 ENCOUNTER — Encounter: Payer: Self-pay | Admitting: "Endocrinology

## 2015-04-09 VITALS — BP 143/79 | HR 73 | Ht 63.0 in | Wt 124.0 lb

## 2015-04-09 DIAGNOSIS — E032 Hypothyroidism due to medicaments and other exogenous substances: Secondary | ICD-10-CM | POA: Insufficient documentation

## 2015-04-09 MED ORDER — LEVOTHYROXINE SODIUM 88 MCG PO TABS: 88.0000 ug | ORAL_TABLET | Freq: Every day | ORAL | Status: DC

## 2015-04-09 NOTE — Progress Notes (Signed)
Subjective:    Patient ID: Anne Davenport, female    DOB: [birth year],    Past Medical History  Diagnosis Date  . Hypertension   . Hyperlipemia   . IBS (irritable bowel syndrome)    Past Surgical History  Procedure Laterality Date  . Abdominal hysterectomy    . Tonsillectomy    . Bladder repair    . Esophagogastroduodenoscopy N/A 08/14/2014    Procedure: ESOPHAGOGASTRODUODENOSCOPY (EGD);  Surgeon: Anne Houston, MD;  Location: [facility];  Service: Endoscopy;  Laterality: N/A;  1:10   Social History   Social History  . Marital Status: Married    Spouse Name: N/A  . Number of Children: N/A  . Years of Education: N/A   Social History Main Topics  . Smoking status: Former Research scientist (life sciences)  . Smokeless tobacco: None  . Alcohol Use: 0.0 oz/week    0 Standard drinks or equivalent per week  . Drug Use: No  . Sexual Activity: Not Asked   Other Topics Concern  . None   Social History Narrative   Outpatient Encounter Prescriptions as of 04/09/2015  Medication Sig  . acetaminophen (TYLENOL) 500 MG tablet Take 500 mg by mouth every 8 (eight) hours as needed (pain).   . Calcium Carbonate-Vitamin D (CALCIUM 600 + D PO) Take 1 tablet by mouth daily.    . diazepam (VALIUM) 5 MG tablet Take 5 mg by mouth every 6 (six) hours as needed for anxiety.  Marland Kitchen estradiol (CLIMARA - DOSED IN MG/24 HR) 0.1 mg/24hr Place 1 patch onto the skin once a week.    . fluticasone (FLONASE) 50 MCG/ACT nasal spray Place 2 sprays into the nose daily as needed. For nasal congestion   . HYDROcodone-acetaminophen (NORCO/VICODIN) 5-325 MG per tablet Take 0.5 tablets by mouth 2 (two) times daily as needed for moderate pain.  Marland Kitchen LORazepam (ATIVAN) 0.5 MG tablet Take 1 tablet (0.5 mg total) by mouth 2 (two) times daily as needed for anxiety.  . Vitamin D, Cholecalciferol, 1000 UNITS TABS Take 1,000 Units by mouth daily.  Marland Kitchen albuterol (PROVENTIL HFA;VENTOLIN HFA) 108 (90 BASE) MCG/ACT inhaler Inhale 1-2 puffs into the  lungs every 6 (six) hours as needed for wheezing. (Patient not taking: Reported on 09/07/2014)  . azelastine (ASTELIN) 137 MCG/SPRAY nasal spray Place 2 sprays into the nose 2 (two) times daily. Use in each nostril as directed  . levothyroxine (SYNTHROID, LEVOTHROID) 88 MCG tablet Take 1 tablet (88 mcg total) by mouth daily.  . pantoprazole (PROTONIX) 40 MG tablet Take 1 tablet (40 mg total) by mouth daily before breakfast. (Patient not taking: Reported on 04/09/2015)  . peg 3350 powder (MOVIPREP) 100 G SOLR Take 1 kit (200 g total) by mouth once. (Patient not taking: Reported on 04/09/2015)  . pseudoephedrine (SUDAFED) 30 MG tablet Take 30 mg by mouth every 4 (four) hours as needed for congestion.  Marland Kitchen SALINE NASAL MIST NA Place 1-2 sprays into the nose daily as needed. For congestion   . sucralfate (CARAFATE) 1 G tablet Take 2 tablets (2 g total) by mouth at bedtime. (Patient not taking: Reported on 04/09/2015)  . SUMAtriptan (IMITREX) 100 MG tablet Take 100 mg by mouth as needed. For migraines   . [DISCONTINUED] albuterol (PROVENTIL HFA;VENTOLIN HFA) 108 (90 BASE) MCG/ACT inhaler Inhale 1-2 puffs into the lungs every 6 (six) hours as needed for wheezing.  . [DISCONTINUED] atorvastatin (LIPITOR) 10 MG tablet Take 10 mg by mouth at bedtime.  No facility-administered encounter medications on file as of 04/09/2015.   ALLERGIES: Allergies  Allergen Reactions  . Codeine Itching and Nausea And Vomiting  . Molds & Smuts   . Zocor [Simvastatin]    VACCINATION STATUS:  There is no immunization history on file for this patient.  HPI   Anne Davenport is a 73- yr-old female patient with medical history as above. She is here to f/u  with repeat TFTs. She received RAI therapy for Graves disease. she feels much better, she is now on  75  mcg of TH.  She has steady weight. She denies personal history of goiter.   Review of Systems  Objective:    BP 143/79 mmHg  Pulse 73  Ht 5' 3"  (1.6 m)  Wt 124  lb (56.246 kg)  BMI 21.97 kg/m2  SpO2 97%  Wt Readings from Last 3 Encounters:  04/09/15 124 lb (56.246 kg)  01/06/15 120 lb (54.432 kg)  09/07/14 117 lb 12.8 oz (53.434 kg)    Physical Exam  Constitutional: overweight, in NAD Eyes: PERRLA, EOMI, no exophthalmos ENT: moist mucous membranes, no thyromegaly, no cervical lymphadenopathy Cardiovascular: RRR, No MRG Respiratory: CTA B Gastrointestinal: abdomen soft, NT, ND, BS+ Musculoskeletal: no deformities, strength intact in all 4 Skin: moist, warm, no rashes Neurological: no tremor with outstretched hands, DTR normal in all 4.   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 She will benefit from a slight increase in her LT4. I  will increase LT4 to 88 mcg po qam.  She will have repeat labs in  6 months with repeat labs: TSH and free t4. -she  understands the need for lifelong thyroid hormone replacement.  Follow up plan: Return in about 6 months (around 10/07/2015) for underactive thyroid.  Anne Lloyd, MD Phone: [phone]  Fax: [phone]   04/09/2015, 9:09 PM

## 2015-04-26 ENCOUNTER — Ambulatory Visit (HOSPITAL_COMMUNITY)
Admission: RE | Admit: 2015-04-26 | Discharge: 2015-04-26 | Disposition: A | Discharge status: Home / Self Care | Payer: Medicare Other | Source: Ambulatory Visit | Attending: Family Medicine | Admitting: Family Medicine

## 2015-04-26 DIAGNOSIS — Z1231 Encounter for screening mammogram for malignant neoplasm of breast: Secondary | ICD-10-CM | POA: Diagnosis present

## 2015-05-10 ENCOUNTER — Encounter (INDEPENDENT_AMBULATORY_CARE_PROVIDER_SITE_OTHER): Payer: Self-pay | Admitting: *Deleted

## 2015-05-20 ENCOUNTER — Encounter: Payer: Self-pay | Admitting: Adult Health

## 2015-05-20 ENCOUNTER — Ambulatory Visit (INDEPENDENT_AMBULATORY_CARE_PROVIDER_SITE_OTHER): Payer: Medicare Other | Admitting: Adult Health

## 2015-05-20 VITALS — BP 170/72 | HR 60 | Ht 63.0 in | Wt 125.0 lb

## 2015-05-20 DIAGNOSIS — N993 Prolapse of vaginal vault after hysterectomy: Secondary | ICD-10-CM

## 2015-05-20 DIAGNOSIS — N750 Cyst of Bartholin's gland: Secondary | ICD-10-CM

## 2015-05-20 HISTORY — DX: Cyst of Bartholin's gland: N75.0

## 2015-05-20 HISTORY — DX: Prolapse of vaginal vault after hysterectomy: N99.3

## 2015-05-20 NOTE — Progress Notes (Signed)
Subjective:     Patient ID: Anne ComptonJean T Davenport, female   DOB: [birth year], 74 y.o.   MRN: [id number]  HPI Anne Davenport is a 74 year old white female, widowed,new to this practice in complaining of having sore spot near clitoris on the right but it has resolved and ?swollen lymph node on the left.She is having sex again and is not having an orgasm as easy.  Review of Systems Patient denies any headaches, hearing loss, fatigue, blurred vision, shortness of breath, chest pain, abdominal pain, problems with bowel movements, urination. No joint pain or mood swings. See HPI for positives. Reviewed past medical,surgical, social and family history. Reviewed medications and allergies.     Objective:   Physical Exam BP 170/72 mmHg  Pulse 60  Ht 5\' 3"  (1.6 m)  Wt 125 lb (56.7 kg)  BMI 22.15 kg/m2 Skin warm and dry.Pelvic: external genitalia is normal in appearance no lesions, vagina: has vaginal vault relaxation and decreased color moisture and rugae,has smooth,mobile,non tender marble sized bartholin cyst on left,urethra has no lesions or masses noted, cervix and uterus are absent, adnexa: no masses or tenderness noted. Bladder is non tender and no masses felt.   Declines STD testing at this time, discussed increased foreplay and using good lubricate and self stimulation.Face time 20 minutes with 50 % counseling.  Assessment:     Bartholin cyst on left   Vaginal vault relaxation  Plan:     Will follow cyst for now Try Luvena for vaginal moisture and astro glide with sex Follow up prn

## 2015-05-20 NOTE — Patient Instructions (Signed)
Bartholin Cyst or Abscess A Bartholin cyst is a fluid-filled sac that forms on a Bartholin gland. Bartholin glands are small glands that are located within the folds of skin (labia) along the sides of the lower opening of the vagina. These glands produce a fluid to moisten the outside of the vagina during sexual intercourse. A Bartholin cyst causes a bulge on the side of the vagina. A cyst that is not large or infected may not cause symptoms or problems. However, if the fluid within the cyst becomes infected, the cyst can turn into an abscess. An abscess may cause discomfort or pain. CAUSES A Bartholin cyst may develop when the duct of the gland becomes blocked. In many cases, the cause of this is not known. Various kinds of bacteria can cause the cyst to become infected and develop into an abscess. RISK FACTORS You may be at an increased risk of developing a Bartholin cyst or abscess if:  You are a woman of reproductive age.  You have a history of previous Bartholin cysts or abscesses.  You have diabetes.  You have a sexually transmitted disease (STD). SIGNS AND SYMPTOMS The severity of symptoms varies depending on the size of the cyst and whether it is infected. Symptoms may include:  A bulge or swelling near the lower opening of your vagina.  Discomfort or pain.  Redness.  Pain during sexual intercourse.  Pain when walking.  Fluid draining from the area. DIAGNOSIS Your health care provider may make a diagnosis based on your symptoms and a physical exam. He or she will look for swelling in your vaginal area. Blood tests may be done to check for infections. A sample of fluid from the cyst or abscess may also be taken to be tested in a lab. TREATMENT Small cysts that are not infected may not require any treatment. These often go away on their own. Yourhealth care provider will recommend hot baths and the use of warm compresses. These may also be part of the treatment for an abscess.  Treatment options for a large cyst or abscess may include:   Antibiotic medicine.  A surgical procedure to drain the abscess. One of the following procedures may be done:  Incision and drainage. An incision is made in the cyst or abscess so that the fluid drains out. A catheter may be placed inside the cyst so that it does not close and fill up with fluid again. The catheter will be removed after you have a follow-up visit with a specialist (gynecologist).  Marsupialization. The cyst or abscess is opened and kept open by stitching the edges of the skin to the walls of the cyst or abscess. This allows it to continue to drain and not fill up with fluid again. If you have cysts or abscesses that keep returning and have required incision and drainage multiple times, your health care provider may talk to you about surgery to remove the Bartholin gland. HOME CARE INSTRUCTIONS  Take medicines only as directed by your health care provider.  If you were prescribed an antibiotic medicine, finish it all even if you start to feel better.  Apply warm, wet compresses to the area or take warm, shallow baths that cover your pelvic region (sitz baths) several times a day or as directed by your health care provider.  Do not squeeze the cyst or apply heavy pressure to it.  Do not have sexual intercourse until the cyst has gone away.  If your cyst or abscess was   opened, a small piece of gauze or a drain may have been placed in the area to allow drainage. Do not remove the gauze or the drain until directed by your health care provider.  Wear feminine pads--not tampons--as needed for any drainage or bleeding.  Keep all follow-up visits as directed by your health care provider. This is important. PREVENTION Take these steps to help prevent a Bartholin cyst from returning:  Practice good hygiene.   Clean your vaginal area with mild soap and a soft cloth when you bathe.  Practice safe sex to prevent  STDs. SEEK MEDICAL CARE IF:  You have increased pain, swelling, or redness in the area of the cyst.  Puslike drainage is coming from the cyst.  You have a fever.   This information is not intended to replace advice given to you by your health care provider. Make sure you discuss any questions you have with your health care provider.   Document Released: 06/26/2005 Document Revised: 07/17/2014 Document Reviewed: 02/09/2014 Elsevier Interactive Patient Education [employer]2016 Elsevier Inc. Follow up with me

## 2015-05-31 ENCOUNTER — Other Ambulatory Visit (HOSPITAL_COMMUNITY): Payer: Self-pay | Admitting: Family Medicine

## 2015-05-31 ENCOUNTER — Ambulatory Visit (HOSPITAL_COMMUNITY)
Admission: RE | Admit: 2015-05-31 | Discharge: 2015-05-31 | Disposition: A | Discharge status: Home / Self Care | Payer: Medicare Other | Source: Ambulatory Visit | Attending: Family Medicine | Admitting: Family Medicine

## 2015-05-31 ENCOUNTER — Telehealth (INDEPENDENT_AMBULATORY_CARE_PROVIDER_SITE_OTHER): Payer: Self-pay | Admitting: *Deleted

## 2015-05-31 DIAGNOSIS — M546 Pain in thoracic spine: Secondary | ICD-10-CM | POA: Diagnosis not present

## 2015-05-31 NOTE — Telephone Encounter (Signed)
Referring MD/PCP: knowlton   Procedure: tcs  Reason/Indication:  screeningin  Has patient had this procedure before?  no  If so, when, by whom and where?    Is there a family history of colon cancer?  no  Who?  What age when diagnosed?    Is patient diabetic?   no      Does patient have prosthetic heart valve or mechanical valve?  no  Do you have a pacemaker?  no  Has patient ever had endocarditis? no  Has patient had joint replacement within last 12 months?  no  Does patient tend to be constipated or take laxatives? no  Does patient have a history of alcohol/drug use?  no  Is patient on Coumadin, Plavix and/or Aspirin? no  Medications: synthroid 75 mg daily, albuterol inhaler prn, astelin nasal spray prn, climara patch weekly, vit d daily, flonase prn, ativan 0.5 mg 1/2 tab prn, sudafed prn  Allergies: codeine  Medication Adjustment:   Procedure date & time: 06/24/15 at 730

## 2015-06-01 NOTE — Telephone Encounter (Signed)
agree

## 2015-06-17 ENCOUNTER — Encounter (INDEPENDENT_AMBULATORY_CARE_PROVIDER_SITE_OTHER): Payer: Self-pay | Admitting: *Deleted

## 2015-06-24 ENCOUNTER — Encounter (HOSPITAL_COMMUNITY)
Admission: RE | Disposition: A | Discharge status: Home / Self Care | Payer: Self-pay | Source: Ambulatory Visit | Attending: Internal Medicine

## 2015-06-24 ENCOUNTER — Ambulatory Visit (HOSPITAL_COMMUNITY)
Admission: RE | Admit: 2015-06-24 | Discharge: 2015-06-24 | Disposition: A | Discharge status: Home / Self Care | Payer: Medicare Other | Source: Ambulatory Visit | Attending: Internal Medicine | Admitting: Internal Medicine

## 2015-06-24 ENCOUNTER — Encounter (HOSPITAL_COMMUNITY): Payer: Self-pay | Admitting: *Deleted

## 2015-06-24 DIAGNOSIS — Z1211 Encounter for screening for malignant neoplasm of colon: Secondary | ICD-10-CM | POA: Diagnosis not present

## 2015-06-24 DIAGNOSIS — F419 Anxiety disorder, unspecified: Secondary | ICD-10-CM | POA: Diagnosis not present

## 2015-06-24 DIAGNOSIS — K5730 Diverticulosis of large intestine without perforation or abscess without bleeding: Secondary | ICD-10-CM

## 2015-06-24 DIAGNOSIS — Z803 Family history of malignant neoplasm of breast: Secondary | ICD-10-CM | POA: Insufficient documentation

## 2015-06-24 DIAGNOSIS — Z87891 Personal history of nicotine dependence: Secondary | ICD-10-CM | POA: Diagnosis not present

## 2015-06-24 DIAGNOSIS — K644 Residual hemorrhoidal skin tags: Secondary | ICD-10-CM | POA: Insufficient documentation

## 2015-06-24 DIAGNOSIS — E785 Hyperlipidemia, unspecified: Secondary | ICD-10-CM | POA: Insufficient documentation

## 2015-06-24 DIAGNOSIS — K6389 Other specified diseases of intestine: Secondary | ICD-10-CM | POA: Diagnosis not present

## 2015-06-24 DIAGNOSIS — K648 Other hemorrhoids: Secondary | ICD-10-CM | POA: Diagnosis not present

## 2015-06-24 DIAGNOSIS — I10 Essential (primary) hypertension: Secondary | ICD-10-CM | POA: Diagnosis not present

## 2015-06-24 HISTORY — DX: Family history of other specified conditions: Z84.89

## 2015-06-24 HISTORY — PX: COLONOSCOPY: SHX5424

## 2015-06-24 SURGERY — COLONOSCOPY
Anesthesia: Moderate Sedation

## 2015-06-24 MED ORDER — STERILE WATER FOR IRRIGATION IR SOLN
Status: DC | PRN
  Administered 2015-06-24: 07:00:00

## 2015-06-24 MED ORDER — MIDAZOLAM HCL 5 MG/5ML IJ SOLN
INTRAMUSCULAR | Status: DC
Start: 2015-06-24 — End: 2015-06-24
  Filled 2015-06-24: qty 10

## 2015-06-24 MED ORDER — SODIUM CHLORIDE 0.9 % IV SOLN
INTRAVENOUS | Status: DC
  Administered 2015-06-24: 1000 mL via INTRAVENOUS

## 2015-06-24 MED ORDER — MIDAZOLAM HCL 5 MG/5ML IJ SOLN
INTRAMUSCULAR | Status: DC | PRN
  Administered 2015-06-24 (×2): 1 mg via INTRAVENOUS
  Administered 2015-06-24 (×2): 2 mg via INTRAVENOUS

## 2015-06-24 MED ORDER — MEPERIDINE HCL 50 MG/ML IJ SOLN
INTRAMUSCULAR | Status: AC
  Filled 2015-06-24: qty 1

## 2015-06-24 MED ORDER — MEPERIDINE HCL 50 MG/ML IJ SOLN
INTRAMUSCULAR | Status: DC | PRN
  Administered 2015-06-24 (×2): 25 mg via INTRAVENOUS

## 2015-06-24 NOTE — Discharge Instructions (Signed)
Resume usual medications and high fiber diet. No driving for 24 hours.  Colonoscopy, Care After These instructions give you information on caring for yourself after your procedure. Your doctor may also give you more specific instructions. Call your doctor if you have any problems or questions after your procedure. HOME CARE  Do not drive for 24 hours.  Do not sign important papers or use machinery for 24 hours.  You may shower.  You may go back to your usual activities, but go slower for the first 24 hours.  Take rest breaks often during the first 24 hours.  Walk around or use warm packs on your belly (abdomen) if you have belly cramping or gas.  Drink enough fluids to keep your pee (urine) clear or pale yellow.  Resume your normal diet. Avoid heavy or fried foods.  Avoid drinking alcohol for 24 hours or as told by your doctor.  Only take medicines as told by your doctor. If a tissue sample (biopsy) was taken during the procedure:   Do not take aspirin or blood thinners for 7 days, or as told by your doctor.  Do not drink alcohol for 7 days, or as told by your doctor.  Eat soft foods for the first 24 hours. GET HELP IF: You still have a small amount of blood in your poop (stool) 2-3 days after the procedure. GET HELP RIGHT AWAY IF:  You have more than a small amount of blood in your poop.  You see clumps of tissue (blood clots) in your poop.  Your belly is puffy (swollen).  You feel sick to your stomach (nauseous) or throw up (vomit).  You have a fever.  You have belly pain that gets worse and medicine does not help. MAKE SURE YOU:  Understand these instructions.  Will watch your condition.  Will get help right away if you are not doing well or get worse.   This information is not intended to replace advice given to you by your health care provider. Make sure you discuss any questions you have with your health care provider.   Document Released: 07/29/2010  Document Revised: 07/01/2013 Document Reviewed: 03/03/2013 Elsevier Interactive Patient Education 2016 Elsevier Inc.   High-Fiber Diet Fiber, also called dietary fiber, is a type of carbohydrate found in fruits, vegetables, whole grains, and beans. A high-fiber diet can have many health benefits. Your health care provider may recommend a high-fiber diet to help:  Prevent constipation. Fiber can make your bowel movements more regular.  Lower your cholesterol.  Relieve hemorrhoids, uncomplicated diverticulosis, or irritable bowel syndrome.  Prevent overeating as part of a weight-loss plan.  Prevent heart disease, type 2 diabetes, and certain cancers. WHAT IS MY PLAN? The recommended daily intake of fiber includes:  38 grams for men under age 72.  30 grams for men over age 18.  25 grams for women under age 38.  21 grams for women over age 52. You can get the recommended daily intake of dietary fiber by eating a variety of fruits, vegetables, grains, and beans. Your health care provider may also recommend a fiber supplement if it is not possible to get enough fiber through your diet. WHAT DO I NEED TO KNOW ABOUT A HIGH-FIBER DIET?  Fiber supplements have not been widely studied for their effectiveness, so it is better to get fiber through food sources.  Always check the fiber content on thenutrition facts label of any prepackaged food. Look for foods that contain at least 5  grams of fiber per serving.  Ask your dietitian if you have questions about specific foods that are related to your condition, especially if those foods are not listed in the following section.  Increase your daily fiber consumption gradually. Increasing your intake of dietary fiber too quickly may cause bloating, cramping, or gas.  Drink plenty of water. Water helps you to digest fiber. WHAT FOODS CAN I EAT? Grains Whole-grain breads. Multigrain cereal. Oats and oatmeal. Brown rice. Barley. Bulgur wheat.  Millet. Bran muffins. Popcorn. Rye wafer crackers. Vegetables Sweet potatoes. Spinach. Kale. Artichokes. Cabbage. Broccoli. Green peas. Carrots. Squash. Fruits Berries. Pears. Apples. Oranges. Avocados. Prunes and raisins. Dried figs. Meats and Other Protein Sources Navy, kidney, pinto, and soy beans. Split peas. Lentils. Nuts and seeds. Dairy Fiber-fortified yogurt. Beverages Fiber-fortified soy milk. Fiber-fortified orange juice. Other Fiber bars. The items listed above may not be a complete list of recommended foods or beverages. Contact your dietitian for more options. WHAT FOODS ARE NOT RECOMMENDED? Grains White bread. Pasta made with refined flour. White rice. Vegetables Fried potatoes. Canned vegetables. Well-cooked vegetables.  Fruits Fruit juice. Cooked, strained fruit. Meats and Other Protein Sources Fatty cuts of meat. Fried Environmental education officerpoultry or fried fish. Dairy Milk. Yogurt. Cream cheese. Sour cream. Beverages Soft drinks. Other Cakes and pastries. Butter and oils. The items listed above may not be a complete list of foods and beverages to avoid. Contact your dietitian for more information. WHAT ARE SOME TIPS FOR INCLUDING HIGH-FIBER FOODS IN MY DIET?  Eat a wide variety of high-fiber foods.  Make sure that half of all grains consumed each day are whole grains.  Replace breads and cereals made from refined flour or white flour with whole-grain breads and cereals.  Replace white rice with brown rice, bulgur wheat, or millet.  Start the day with a breakfast that is high in fiber, such as a cereal that contains at least 5 grams of fiber per serving.  Use beans in place of meat in soups, salads, or pasta.  Eat high-fiber snacks, such as berries, raw vegetables, nuts, or popcorn.   This information is not intended to replace advice given to you by your health care provider. Make sure you discuss any questions you have with your health care provider.   Document Released:  06/26/2005 Document Revised: 07/17/2014 Document Reviewed: 12/09/2013 Elsevier Interactive Patient Education [employer] Elsevier Inc.

## 2015-06-24 NOTE — H&P (Signed)
Anne Davenport is an 74 y.o. female.   Chief Complaint: Patient is here for colonoscopy. HPI: Patient is 74 year old Caucasian female who is in for screening colonoscopy. This is patient's first exam. She denies abdominal pain change in bowel habits rectal bleeding. Family history is negative for CRC.  Past Medical History  Diagnosis Date  . Hypertension   . Hyperlipemia   . IBS (irritable bowel syndrome)   . Anxiety   . Bartholin cyst 05/20/2015  . 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 Family history of adverse reaction to anesthesia     Daughter stopped breathing with Ativan    Past Surgical History  Procedure Laterality Date  . Abdominal hysterectomy    . Tonsillectomy    . Bladder repair    . Esophagogastroduodenoscopy N/A 08/14/2014    Procedure: ESOPHAGOGASTRODUODENOSCOPY (EGD);  Surgeon: Malissa Hippo, MD;  Location: [facility];  Service: Endoscopy;  Laterality: N/A;  1:10  . Wrist surgery Left     Family History  Problem Relation Age of Onset  . Diabetes Mother   . Hypertension Mother   . Alcohol abuse Brother   . Cancer Daughter     breast  . Hodgkin's lymphoma Daughter   . Diabetes Maternal Grandfather   . Heart disease Maternal Grandfather   . Other Paternal Grandfather     poor circulation   Social History:  reports that she quit smoking about 30 years ago. Her smoking use included Cigarettes. She has a 5 pack-year smoking history. She has never used smokeless tobacco. She reports that she drinks alcohol. She reports that she does not use illicit drugs.  Allergies:  Allergies  Allergen Reactions  . Codeine Itching and Nausea And Vomiting  . Molds & Smuts   . Zocor [Simvastatin]     Medications Prior to Admission  Medication Sig Dispense Refill  . Calcium Carbonate-Vitamin D (CALCIUM 600 + D PO) Take 1 tablet by mouth daily.      Marland Kitchen estradiol (CLIMARA - DOSED IN MG/24 HR) 0.1 mg/24hr Place 1 patch onto the  skin once a week.      . fluticasone (FLONASE) 50 MCG/ACT nasal spray Place 2 sprays into the nose daily as needed. For nasal congestion     . HYDROcodone-acetaminophen (NORCO/VICODIN) 5-325 MG per tablet Take 0.5 tablets by mouth as needed for moderate pain.     Marland Kitchen levothyroxine (SYNTHROID, LEVOTHROID) 88 MCG tablet Take 1 tablet (88 mcg total) by mouth daily. 30 tablet 6  . Lido-Capsaicin-Men-Methyl Sal (MEDI-PATCH-LIDOCAINE EX) Apply topically as needed.    . Oxymetazoline HCl (AFRIN NASAL SPRAY NA) Place into the nose as needed.    . pseudoephedrine (SUDAFED) 30 MG tablet Take 30 mg by mouth every 4 (four) hours as needed for congestion.    Marland Kitchen SALINE NASAL MIST NA Place 1-2 sprays into the nose daily as needed. For congestion     . sertraline (ZOLOFT) 50 MG tablet Take 25 mg by mouth daily.    . Vitamin D, Cholecalciferol, 1000 UNITS TABS Take 1,000 Units by mouth daily.    Marland Kitchen acetaminophen (TYLENOL) 500 MG tablet Take 500 mg by mouth every 8 (eight) hours as needed (pain).     . Biotin 1000 MCG tablet Take 1,000 mcg by mouth daily.    Marland Kitchen LORazepam (ATIVAN) 0.5 MG tablet Take 1 tablet (0.5 mg total) by mouth 2 (two) times daily as needed for anxiety. 60 tablet 0    No  results found for this or any previous visit (from the past 48 hour(s)). No results found.  ROS  Blood pressure 119/71, pulse 70, temperature 97.7 F (36.5 C), temperature source Oral, resp. rate 16, height 5\' 3"  (1.6 m), weight 119 lb (53.978 kg), SpO2 100 %. Physical Exam  Constitutional: She appears well-developed and well-nourished.  HENT:  Mouth/Throat: Oropharynx is clear and moist.  Eyes: Conjunctivae are normal. No scleral icterus.  Neck: No thyromegaly present.  Cardiovascular: Normal rate, regular rhythm and normal heart sounds.   No murmur heard. Respiratory: Effort normal and breath sounds normal.  GI: Soft. She exhibits no distension and no mass. There is no tenderness.  Musculoskeletal: She exhibits no  edema.  Lymphadenopathy:    She has no cervical adenopathy.  Neurological: She is alert.  Skin: Skin is warm and dry.     Assessment/Plan Average risk screening colonoscopy  Anne Davenport U 06/24/2015, 7:32 AM

## 2015-06-24 NOTE — Progress Notes (Signed)
COLONOSCOPY PROCEDURE REPORT  PATIENT:  Anne ComptonJean T Davenport  MR#:  [id number] Birthdate:  13-Sep-1940, 74 y.o., female Endoscopist:  Dr. Malissa HippoNajeeb U. Rehman, MD Referred By:  Dr. Milana ObeyStephen D Knowlton, MD  Procedure Date: 06/24/2015  Procedure:   Colonoscopy  Indications:  Patient is 74 year old Caucasian female who is undergoing average risk screening colonoscopy. This is patient's first exam.  Informed Consent:  The procedure and risks were reviewed with the patient and informed consent was obtained.  Medications:  Demerol 50 mg IV Versed 6 mg IV  Description of procedure:  After a digital rectal exam was performed, that colonoscope was advanced from the anus through the rectum and colon to the area of the cecum, ileocecal valve and appendiceal orifice. The cecum was deeply intubated. These structures were well-seen and photographed for the record. From the level of the cecum and ileocecal valve, the scope was slowly and cautiously withdrawn. The mucosal surfaces were carefully surveyed utilizing scope tip to flexion to facilitate fold flattening as needed. The scope was pulled down into the rectum where a thorough exam including retroflexion was performed.  Findings:   Prep satisfactory. Few scattered diverticula noted at sigmoid colon. Normal rectal mucosa. Small hemorrhoids below the dentate line and anal papilla.   Therapeutic/Diagnostic Maneuvers Performed:   None  Complications:  None  EBL: none  Cecal Withdrawal Time:  8 minutes  Impression:  Examination performed to cecum. Mild sigmoid colon diverticulosis and small external hemorrhoids and anal papilla. Oh evidence of colonic polyps.  Recommendations:  Standard instructions given. High fiber diet.  REHMAN,NAJEEB U  06/24/2015 8:21 AM  CC: Dr. Milana ObeyKNOWLTON,STEPHEN D, MD & Dr. Bonnetta BarryNo ref. provider found

## 2015-06-29 ENCOUNTER — Encounter (HOSPITAL_COMMUNITY): Payer: Self-pay | Admitting: Internal Medicine

## 2015-07-23 ENCOUNTER — Encounter: Payer: Self-pay | Admitting: Adult Health

## 2015-07-23 ENCOUNTER — Other Ambulatory Visit: Payer: Self-pay | Admitting: Adult Health

## 2015-07-23 ENCOUNTER — Ambulatory Visit (INDEPENDENT_AMBULATORY_CARE_PROVIDER_SITE_OTHER): Payer: Medicare Other | Admitting: Adult Health

## 2015-07-23 VITALS — BP 150/80 | HR 72 | Ht 63.0 in | Wt 127.0 lb

## 2015-07-23 DIAGNOSIS — Z113 Encounter for screening for infections with a predominantly sexual mode of transmission: Secondary | ICD-10-CM

## 2015-07-23 HISTORY — DX: Encounter for screening for infections with a predominantly sexual mode of transmission: Z11.3

## 2015-07-23 NOTE — Progress Notes (Signed)
Subjective:     Patient ID: Anne ComptonJean T Davenport, female   DOB: Jan 17, 1941, 75 y.o.   MRN: [id number]  HPI Anne Davenport is a 75 year old white female, in requesting STD testing, partner cheated and they have broken up and she just wants to make sure, all is good. No symptoms or complaints.  Review of Systems Patient denies any headaches, hearing loss, fatigue, blurred vision, shortness of breath, chest pain, abdominal pain, problems with bowel movements, urination, or intercourse. No joint pain or mood swings. Reviewed past medical,surgical, social and family history. Reviewed medications and allergies.     Objective:   Physical Exam BP 150/80 mmHg  Pulse 72  Ht 5\' 3"  (1.6 m)  Wt 127 lb (57.607 kg)  BMI 22.50 kg/m2   Skin warm and dry.Pelvic: external genitalia is normal in appearance no lesions, vagina: scant discharge without odor,urethra has no lesions or masses noted, cervix and uterus are absent has pelvic relaxation, adnexa: no masses or tenderness noted. Bladder is non tender and no masses felt.   Assessment:     STD screening    Plan:     Check for HIV,RPR,HSV 2, hept B&C and GC/HL on urine  Will talk when labs back

## 2015-07-23 NOTE — Patient Instructions (Signed)
Will talk when labs back  

## 2015-07-24 LAB — RPR: RPR Ser Ql: NONREACTIVE

## 2015-07-24 LAB — HSV 2 ANTIBODY, IGG: HSV 2 GLYCOPROTEIN G AB, IGG: 11.4 {index} — AB (ref 0.00–0.90)

## 2015-07-24 LAB — HEPATITIS C ANTIBODY: HEP C VIRUS AB: 0.1 {s_co_ratio} (ref 0.0–0.9)

## 2015-07-24 LAB — HIV ANTIBODY (ROUTINE TESTING W REFLEX): HIV Screen 4th Generation wRfx: NONREACTIVE

## 2015-07-24 LAB — HEPATITIS B SURFACE ANTIGEN: HEP B S AG: NEGATIVE

## 2015-07-25 LAB — GC/CHLAMYDIA PROBE AMP
Chlamydia trachomatis, NAA: NEGATIVE
Neisseria gonorrhoeae by PCR: NEGATIVE

## 2015-07-27 ENCOUNTER — Encounter: Payer: Self-pay | Admitting: Adult Health

## 2015-07-27 ENCOUNTER — Telehealth: Payer: Self-pay | Admitting: Adult Health

## 2015-07-27 DIAGNOSIS — R894 Abnormal immunological findings in specimens from other organs, systems and tissues: Secondary | ICD-10-CM | POA: Insufficient documentation

## 2015-07-27 HISTORY — DX: Abnormal immunological findings in specimens from other organs, systems and tissues: R89.4

## 2015-07-27 NOTE — Telephone Encounter (Signed)
Pt aware of labs and +HSV 2 antibodies

## 2015-07-27 NOTE — Telephone Encounter (Signed)
Pt had question about +HSV test, exposure greater than 90 days to form antibodies

## 2015-08-18 ENCOUNTER — Encounter: Payer: Self-pay | Admitting: Orthopaedic Surgery

## 2015-08-18 ENCOUNTER — Ambulatory Visit (INDEPENDENT_AMBULATORY_CARE_PROVIDER_SITE_OTHER): Payer: Medicare Other | Admitting: Orthopaedic Surgery

## 2015-08-18 VITALS — BP 134/62 | HR 69 | Resp 16 | Ht 63.8 in | Wt 127.0 lb

## 2015-08-18 DIAGNOSIS — M25561 Pain in right knee: Secondary | ICD-10-CM | POA: Diagnosis not present

## 2015-08-18 DIAGNOSIS — M25511 Pain in right shoulder: Secondary | ICD-10-CM | POA: Insufficient documentation

## 2015-08-18 NOTE — Progress Notes (Signed)
Patient Anne Davenport, female DOB:1940/08/01, 75 y.o. [id number]  Chief Complaint  Patient presents with  . Follow-up    follow up Rt knee "better but still sore", Rt shoulder " much better"    HPI  Anne Davenport is a 75 y.o. female who has had pain with her right shoulder and I injected it last time.  She is much improved now and having no pain in the right shoulder. She is very happy about that.  She had fallen several weeks ago and hurt her right knee.  The knee is slightly better medially but still has some pain at times.  She has less swelling.  She has less tenderness but it is still sore after being up on it a while.  She has no new trauma, she has no giving way, no locking and no redness.  I have told her it will take a little time to get better.  We discussed the possibility of a MRI of the right knee.  I would prefer to wait a little while and see how it does.  If it gets worse, we can do a MRI. She is agreeable to this.  HPI  Body mass index is 21.92 kg/(m^2).  Review of Systems  Patient does not have Diabetes Mellitus. Patient does not have hypertension. Patient does not have COPD or shortness of breath. Patient does not have BMI > 35. Patient does not have current smoking history.  Review of Systems  Past Medical History  Diagnosis Date  . Hypertension   . Hyperlipemia   . IBS (irritable bowel syndrome)   . Anxiety   . Bartholin cyst 05/20/2015  . 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 Family history of adverse reaction to anesthesia     Daughter stopped breathing with Ativan  . Back pain     mid back  . Screening for STD (sexually transmitted disease) 07/23/2015  . 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 Abdominal hysterectomy    . Tonsillectomy    . Bladder repair    . Esophagogastroduodenoscopy N/A 08/14/2014    Procedure: ESOPHAGOGASTRODUODENOSCOPY  (EGD);  Surgeon: Malissa Hippo, MD;  Location: [facility];  Service: Endoscopy;  Laterality: N/A;  1:10  . Wrist surgery Left   . Colonoscopy N/A 06/24/2015    Procedure: COLONOSCOPY;  Surgeon: Malissa Hippo, MD;  Location: [facility];  Service: Endoscopy;  Laterality: N/A;  730    Family History  Problem Relation Age of Onset  . Diabetes Mother   . Hypertension Mother   . Alcohol abuse Brother   . Cancer Daughter     breast  . Hodgkin's lymphoma Daughter   . Diabetes Maternal Grandfather   . Heart disease Maternal Grandfather   . Other Paternal Grandfather     poor circulation    Social History Social History  Substance Use Topics  . Smoking status: Former Smoker -- 0.50 packs/day for 10 years    Types: Cigarettes    Quit date: 07/09/1984  . Smokeless tobacco: Never Used  . Alcohol Use: 0.0 oz/week    0 Standard drinks or equivalent per week     Comment: "once in a while"    Allergies  Allergen Reactions  . Codeine Itching and Nausea And Vomiting  . Molds & Smuts   . Zocor [Simvastatin]     Current Outpatient Prescriptions  Medication Sig Dispense Refill  .  acetaminophen (TYLENOL) 500 MG tablet Take 500 mg by mouth every 8 (eight) hours as needed (pain).     . Biotin 1000 MCG tablet Take 1,000 mcg by mouth daily.    . Calcium Carbonate-Vitamin D (CALCIUM 600 + D PO) Take 1 tablet by mouth daily.      Marland Kitchen estradiol (CLIMARA - DOSED IN MG/24 HR) 0.1 mg/24hr Place 1 patch onto the skin once a week.      . fluticasone (FLONASE) 50 MCG/ACT nasal spray Place 2 sprays into the nose daily as needed. For nasal congestion     . HYDROcodone-acetaminophen (NORCO/VICODIN) 5-325 MG per tablet Take 0.5 tablets by mouth as needed for moderate pain.     Marland Kitchen levothyroxine (SYNTHROID, LEVOTHROID) 88 MCG tablet Take 1 tablet (88 mcg total) by mouth daily. 30 tablet 6  . Lido-Capsaicin-Men-Methyl Sal (MEDI-PATCH-LIDOCAINE EX) Apply topically as needed.    . Oxymetazoline HCl (AFRIN  NASAL SPRAY NA) Place into the nose as needed.    . pseudoephedrine (SUDAFED) 30 MG tablet Take 30 mg by mouth every 4 (four) hours as needed for congestion.    Marland Kitchen SALINE NASAL MIST NA Place 1-2 sprays into the nose daily as needed. For congestion     . sertraline (ZOLOFT) 50 MG tablet Take 25 mg by mouth daily.    . Vitamin D, Cholecalciferol, 1000 UNITS TABS Take 1,000 Units by mouth daily.    Marland Kitchen LORazepam (ATIVAN) 0.5 MG tablet Take 1 tablet (0.5 mg total) by mouth 2 (two) times daily as needed for anxiety. (Patient not taking: Reported on 08/18/2015) 60 tablet 0   No current facility-administered medications for this visit.     Physical Exam  Blood pressure 134/62, pulse 69, resp. rate 16, height 5' 3.8" (1.621 m), weight 127 lb (57.607 kg).  Constitutional: overall normal hygiene, normal nutrition, well developed, normal grooming, normal body habitus. Assistive device:none  Musculoskeletal: gait and station Limp very slight to the right, muscle tone and strength are normal, no tremors or atrophy is present.  .  Neurological: coordination overall normal.  Deep tendon reflex/nerve stretch intact.  Sensation normal.  Cranial nerves II-XII intact.   Skin:normal overall no scars, lesions, ulcers or rash es. No psoriasis.  Psychiatric: Alert and oriented x 3.  Recent memory intact, remote memory unclear.  Normal mood and affect. Well groomed.  Good eye contact.  Cardiovascular: overall no swelling, no varicosities, no edema bilaterally, normal temperatures of the legs and arms, no clubbing, cyanosis and good capillary refill.  Lymphatic: palpation is normal.   Extremities:the right knee has some slight medial tenderness with no redness or effusion.  Her right shoulder has full motion. Inspection normal of the right knee and right shoulder Strength and tone normal of both Range of motion full of the right shoulder and 0 to 120 of the right knee with slight crepitus.   PLAN Call if  any problems.  Precautions discussed.  Continue current medications.   Return to clinic one month.  Call if knee worse and consider MRI of the right knee.

## 2015-08-18 NOTE — Patient Instructions (Signed)
Continue activity levels and exercises for the right knee and right shoulder.   Call if right knee pain worse, consider MRI.

## 2015-09-07 ENCOUNTER — Telehealth: Payer: Self-pay | Admitting: Orthopaedic Surgery

## 2015-09-07 MED ORDER — HYDROCODONE-ACETAMINOPHEN 5-325 MG PO TABS: 1.0000 | ORAL_TABLET | ORAL | Status: DC | PRN

## 2015-09-07 NOTE — Telephone Encounter (Signed)
Rx printed

## 2015-09-07 NOTE — Telephone Encounter (Signed)
Per nurse, called patient to notify prescription ready for pickup.

## 2015-09-07 NOTE — Telephone Encounter (Signed)
Patient called for refill, medication: Hydrocodone 5/325, quantity 120. States she was asked about a refill at last visit, and didn't need at that time, and now does need. Please advise.  [phone]

## 2015-09-16 ENCOUNTER — Ambulatory Visit: Payer: Medicare Other | Admitting: Orthopaedic Surgery

## 2015-10-07 ENCOUNTER — Ambulatory Visit: Payer: Medicare Other | Admitting: "Endocrinology

## 2016-01-13 ENCOUNTER — Encounter: Payer: Self-pay | Admitting: Orthopaedic Surgery

## 2016-01-13 ENCOUNTER — Ambulatory Visit (INDEPENDENT_AMBULATORY_CARE_PROVIDER_SITE_OTHER): Payer: Medicare Other | Admitting: Orthopaedic Surgery

## 2016-01-13 VITALS — BP 149/87 | HR 73 | Temp 97.5°F | Ht 63.0 in | Wt 135.7 lb

## 2016-01-13 DIAGNOSIS — S8261XA Displaced fracture of lateral malleolus of right fibula, initial encounter for closed fracture: Secondary | ICD-10-CM

## 2016-01-13 MED ORDER — HYDROCODONE-ACETAMINOPHEN 5-325 MG PO TABS: 1.0000 | ORAL_TABLET | ORAL | Status: DC | PRN

## 2016-01-13 NOTE — Progress Notes (Signed)
Patient ZO:XWRU:Anne Davenport, female DOB:12/25/1940, 75 y.o. [id number]  Chief Complaint  Patient presents with  . New Problem    Right ankle fracture DOI 01/09/16    HPI  Anne ComptonJean T Davenport is a 75 [place].o. [place] and she hurt her right ankle on January 08, 2016.  She went to Urgent Care.  X-rays showed a nondisplaced fracture of the right lateral malleolus.   She was given splint and crutches and told to follow-up here.  She has no other injury.  She has pain controlled.  She is not great with crutches but does OK.  She has been elevating the foot.  HPI  Body mass index is 24.04 kg/(m^2).  ROS  Review of Systems  Respiratory: Negative for cough and shortness of breath.   Cardiovascular: Negative for chest pain.  Endocrine: Positive for cold intolerance.  Musculoskeletal: Positive for joint swelling, arthralgias and gait problem.  Allergic/Immunologic: Positive for environmental allergies.    Past Medical History  Diagnosis Date  . Hypertension   . Hyperlipemia   . IBS (irritable bowel syndrome)   . Anxiety   . Bartholin cyst 05/20/2015  . 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 Family history of adverse reaction to anesthesia     Daughter stopped breathing with Ativan  . Back pain     mid back  . Screening for STD (sexually transmitted disease) 07/23/2015  . 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 Abdominal hysterectomy    . Tonsillectomy    . Bladder repair    . Esophagogastroduodenoscopy N/A 08/14/2014    Procedure: ESOPHAGOGASTRODUODENOSCOPY (EGD);  Surgeon: Malissa HippoNajeeb U Rehman, MD;  Location: [facility];  Service: Endoscopy;  Laterality: N/A;  1:10  . Wrist surgery Left   . Colonoscopy N/A 06/24/2015    Procedure: COLONOSCOPY;  Surgeon: Malissa HippoNajeeb U Rehman, MD;  [facility]: [facility];  Service: Endoscopy;  Laterality: N/A;  730    Family  History  Problem Relation Age of Onset  . Diabetes Mother   . Hypertension Mother   . Alcohol abuse Brother   . Cancer Daughter     breast  . Hodgkin's lymphoma Daughter   . Diabetes Maternal Grandfather   . Heart disease Maternal Grandfather   . Other Paternal Grandfather     poor circulation    Social History Social History  Substance Use Topics  . Smoking status: Former Smoker -- 0.50 packs/day for 10 years    Types: Cigarettes    Quit date: 07/09/1984  . Smokeless tobacco: Never Used  . Alcohol Use: 0.0 oz/week    0 Standard drinks or equivalent per week     Comment: "once in a while"    Allergies  Allergen Reactions  . Codeine Itching and Nausea And Vomiting  . Molds & Smuts   . Sulfa Antibiotics   . Zocor [Simvastatin]     Current Outpatient Prescriptions  Medication Sig Dispense Refill  . acetaminophen (TYLENOL) 500 MG tablet Take 500 mg by mouth every 8 (eight) hours as needed (pain).     . Biotin 1000 MCG tablet Take 1,000 mcg by mouth daily.    . Calcium Carbonate-Vitamin D (CALCIUM 600 + D PO) Take 1 tablet by mouth daily.      Marland Kitchen. estradiol (CLIMARA - DOSED IN MG/24 HR) 0.1 mg/24hr Place 1 patch onto the skin once a  week.      . fluticasone (FLONASE) 50 MCG/ACT nasal spray Place 2 sprays into the nose daily as needed. For nasal congestion     . levothyroxine (SYNTHROID, LEVOTHROID) 88 MCG tablet Take 1 tablet (88 mcg total) by mouth daily. 30 tablet 6  . Lido-Capsaicin-Men-Methyl Sal (MEDI-PATCH-LIDOCAINE EX) Apply topically as needed.    Marland Kitchen. LORazepam (ATIVAN) 0.5 MG tablet Take 1 tablet (0.5 mg total) by mouth 2 (two) times daily as needed for anxiety. 60 tablet 0  . Oxymetazoline HCl (AFRIN NASAL SPRAY NA) Place into the nose as needed.    . pseudoephedrine (SUDAFED) 30 MG tablet Take 30 mg by mouth every 4 (four) hours as needed for congestion.    Marland Kitchen. SALINE NASAL MIST NA Place 1-2 sprays into the nose daily as needed. For congestion     . sertraline (ZOLOFT)  50 MG tablet Take 25 mg by mouth daily.    . Vitamin D, Cholecalciferol, 1000 UNITS TABS Take 1,000 Units by mouth daily.    Marland Kitchen. HYDROcodone-acetaminophen (NORCO/VICODIN) 5-325 MG tablet Take 1 tablet by mouth every 4 (four) hours as needed for moderate pain (Must last 30 days.  Do not take and drive a car or use machinery.). 120 tablet 0   No current facility-administered medications for this visit.     Physical Exam  Blood pressure 149/87, pulse 73, temperature 97.5 F (36.4 C), height 5\' 3"  (1.6 m), weight 135 lb 11 oz (61.549 kg).  Constitutional: overall normal hygiene, normal nutrition, well developed, normal grooming, normal body habitus. Assistive device:crutches, lower leg splint  Musculoskeletal: gait and station Limp right, muscle tone and strength are normal, no tremors or atrophy is present.  .  Neurological: coordination overall normal.  Deep tendon reflex/nerve stretch intact.  Sensation normal.  Cranial nerves II-XII intact.   Skin:   normal overall no scars, lesions, ulcers or rashes. No psoriasis.  Psychiatric: Alert and oriented x 3.  Recent memory intact, remote memory unclear.  Normal mood and affect. Well groomed.  Good eye contact.  Cardiovascular: overall no swelling, no varicosities, no edema bilaterally, normal temperatures of the legs and arms, no clubbing, cyanosis and good capillary refill.  Lymphatic: palpation is normal.  She has ecchymosis of the right ankle with swelling laterally.  ROM is decreased.  NV is intact.  Left ankle negative.  The patient has been educated about the nature of the problem(s) and counseled on treatment options.  The patient appeared to understand what I have discussed and is in agreement with it.  Encounter Diagnosis  Name Primary?  . Fracture of right ankle, lateral malleolus, closed, initial encounter Yes    PLAN Call if any problems.  Precautions discussed.  Continue current medications.   Return to clinic 1 week    X-rays on return  Electronically Signed Darreld McleanWayne Jaelene Garciagarcia, MD 7/6/20178:47 AM

## 2016-01-13 NOTE — Patient Instructions (Signed)
Use CAM Walker  Use Contrast Baths  Elevate as needed  Weight bear as tolerated.

## 2016-01-20 ENCOUNTER — Encounter: Payer: Self-pay | Admitting: Orthopaedic Surgery

## 2016-01-20 ENCOUNTER — Ambulatory Visit (INDEPENDENT_AMBULATORY_CARE_PROVIDER_SITE_OTHER): Payer: Medicare Other

## 2016-01-20 ENCOUNTER — Ambulatory Visit: Payer: Medicare Other | Admitting: Orthopaedic Surgery

## 2016-01-20 VITALS — BP 130/58 | HR 78 | Temp 97.0°F | Ht 64.0 in | Wt 137.0 lb

## 2016-01-20 DIAGNOSIS — S8261XA Displaced fracture of lateral malleolus of right fibula, initial encounter for closed fracture: Secondary | ICD-10-CM

## 2016-01-20 NOTE — Progress Notes (Signed)
CC:  My ankle has a little stinging at times  She has been wearing the CAM walker on the right.  She has no new trauma.  She has slight lateral swelling of the right ankle.  NV intact.  X-rays were done and reported separately.  Encounter Diagnosis  Name Primary?  . Fracture of right ankle, lateral malleolus, closed, initial encounter Yes    Return in two weeks.  X-rays on return.  Call if any problem.  Electronically Signed Darreld McleanWayne Zev Blue, MD 7/13/201710:24 AM

## 2016-01-20 NOTE — Patient Instructions (Signed)
Continue boot

## 2016-02-03 ENCOUNTER — Encounter: Payer: Self-pay | Admitting: Orthopaedic Surgery

## 2016-02-03 ENCOUNTER — Ambulatory Visit: Payer: Medicare Other | Admitting: Orthopaedic Surgery

## 2016-02-03 ENCOUNTER — Ambulatory Visit (HOSPITAL_COMMUNITY)
Admission: RE | Admit: 2016-02-03 | Discharge: 2016-02-03 | Disposition: A | Discharge status: Home / Self Care | Payer: Medicare Other | Source: Ambulatory Visit | Attending: Orthopaedic Surgery | Admitting: Orthopaedic Surgery

## 2016-02-03 VITALS — BP 128/77 | HR 82 | Temp 97.7°F | Ht 64.0 in | Wt 135.0 lb

## 2016-02-03 DIAGNOSIS — S8261XD Displaced fracture of lateral malleolus of right fibula, subsequent encounter for closed fracture with routine healing: Secondary | ICD-10-CM

## 2016-02-03 DIAGNOSIS — X58XXXD Exposure to other specified factors, subsequent encounter: Secondary | ICD-10-CM | POA: Insufficient documentation

## 2016-02-03 NOTE — Patient Instructions (Signed)
Work one-half day.  Use Air Cast.

## 2016-02-03 NOTE — Progress Notes (Signed)
CC:  My ankle is better  She is doing well with the CAM walker.  I will change to Air Cast.  NV intact to the right ankle. ROM is full.  Encounter Diagnosis  Name Primary?  Marland Kitchen Ankle fracture, lateral malleolus, closed, right, with routine healing, subsequent encounter Yes   Instructions for Air Cast given.  Return in two weeks.  X-rays on return.  Weight bear as tolerated.  Work 1/2 day.  Call if any problem.  Electronically Signed Darreld Mclean, MD 7/27/201710:10 AM

## 2016-02-17 ENCOUNTER — Encounter: Payer: Self-pay | Admitting: Orthopaedic Surgery

## 2016-02-17 ENCOUNTER — Ambulatory Visit (INDEPENDENT_AMBULATORY_CARE_PROVIDER_SITE_OTHER): Payer: Medicare Other

## 2016-02-17 ENCOUNTER — Ambulatory Visit (INDEPENDENT_AMBULATORY_CARE_PROVIDER_SITE_OTHER): Payer: Medicare Other | Admitting: Orthopaedic Surgery

## 2016-02-17 VITALS — BP 120/63 | HR 79 | Temp 97.5°F | Ht 63.0 in | Wt 133.8 lb

## 2016-02-17 DIAGNOSIS — S8261XD Displaced fracture of lateral malleolus of right fibula, subsequent encounter for closed fracture with routine healing: Secondary | ICD-10-CM | POA: Diagnosis not present

## 2016-02-17 NOTE — Progress Notes (Signed)
CC:  My ankle is sore at times  She has come out of the Air Cast but still has some lateral right ankle pain.   She has no new trauma.  NV intact. Slight lateral swelling. ROM full. Gait is good.   X-rays were done of the right ankle, reported separately.  Encounter Diagnosis  Name Primary?  Marland Kitchen. Ankle fracture, lateral malleolus, closed, right, with routine healing, subsequent encounter Yes   She can stop the Air Cast as needed.  Return in three weeks.  Call if any problem.  X-rays on return.  Electronically Signed Darreld McleanWayne Jerimey Burridge, MD 8/10/20174:25 PM

## 2016-03-02 ENCOUNTER — Encounter (INDEPENDENT_AMBULATORY_CARE_PROVIDER_SITE_OTHER): Payer: Medicare Other | Admitting: Ophthalmology

## 2016-03-02 DIAGNOSIS — H43813 Vitreous degeneration, bilateral: Secondary | ICD-10-CM

## 2016-03-02 DIAGNOSIS — H33022 Retinal detachment with multiple breaks, left eye: Secondary | ICD-10-CM | POA: Diagnosis not present

## 2016-03-09 ENCOUNTER — Ambulatory Visit: Payer: Medicare Other | Admitting: Orthopaedic Surgery

## 2016-03-14 ENCOUNTER — Encounter: Payer: Self-pay | Admitting: Orthopaedic Surgery

## 2016-03-14 ENCOUNTER — Ambulatory Visit (INDEPENDENT_AMBULATORY_CARE_PROVIDER_SITE_OTHER): Payer: Medicare Other | Admitting: Orthopaedic Surgery

## 2016-03-14 ENCOUNTER — Ambulatory Visit (INDEPENDENT_AMBULATORY_CARE_PROVIDER_SITE_OTHER): Payer: Medicare Other

## 2016-03-14 VITALS — BP 139/78 | HR 66 | Temp 97.3°F | Ht 63.0 in | Wt 133.0 lb

## 2016-03-14 DIAGNOSIS — M25511 Pain in right shoulder: Secondary | ICD-10-CM

## 2016-03-14 DIAGNOSIS — S8261XD Displaced fracture of lateral malleolus of right fibula, subsequent encounter for closed fracture with routine healing: Secondary | ICD-10-CM

## 2016-03-14 NOTE — Progress Notes (Signed)
Patient EX:BMWU:Anne Davenport, female DOB:Mar 21, 1941, 75 y.o. [id number]  Chief Complaint  Patient presents with  . Follow-up    Right ankle, and c/o right shoudler pain, may want injection    HPI  Anne Davenport is a 75 y.o. female who has had a fracture of the right lateral ankle.  She has done well.  She has developed pain of the right shoulder.  It hurts when she raises her hand over her head.  She has no trauma, no paresthesias, no swelling.  She has tried Aleve and ice and heat with some help.  It is getting worse.  HPI  Body mass index is 23.56 kg/m.  ROS  Review of Systems  Respiratory: Negative for cough and shortness of breath.   Cardiovascular: Negative for chest pain.  Endocrine: Positive for cold intolerance.  Musculoskeletal: Positive for arthralgias, gait problem and joint swelling.  Allergic/Immunologic: Positive for environmental allergies.    Past Medical History:  Diagnosis Date  . Anxiety   . Back pain    mid back  . Bartholin cyst 05/20/2015  . Family history of adverse reaction to anesthesia    Daughter stopped breathing with Ativan  . 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 Hyperlipemia   . Hypertension   . IBS (irritable bowel syndrome)   . 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 Screening for STD (sexually transmitted disease) 07/23/2015    Past Surgical History:  Procedure Laterality Date  . ABDOMINAL HYSTERECTOMY    . BLADDER REPAIR    . COLONOSCOPY N/A 06/24/2015   Procedure: COLONOSCOPY;  Surgeon: Malissa HippoNajeeb U Rehman, MD;  [facility];  Service: Endoscopy;  Laterality: N/A;  730  . ESOPHAGOGASTRODUODENOSCOPY N/A 08/14/2014   Procedure: ESOPHAGOGASTRODUODENOSCOPY (EGD);  Surgeon: Malissa HippoNajeeb U Rehman, MD;  [facility];  Service: Endoscopy;  Laterality: N/A;  1:10  . TONSILLECTOMY    . WRIST SURGERY Left     Family History  Problem Relation Age of Onset  . Diabetes Mother   .  Hypertension Mother   . Alcohol abuse Brother   . Cancer Daughter     breast  . Hodgkin's lymphoma Daughter   . Diabetes Maternal Grandfather   . Heart disease Maternal Grandfather   . Other Paternal Grandfather     poor circulation    Social History Social History  Substance Use Topics  . Smoking status: Former Smoker    Packs/day: 0.50    Years: 10.00    Types: Cigarettes    Quit date: 07/09/1984  . Smokeless tobacco: Never Used  . Alcohol use 0.0 oz/week     Comment: "once in a while"    Allergies  Allergen Reactions  . Codeine Itching and Nausea And Vomiting  . Molds & Smuts   . Sulfa Antibiotics   . Zocor [Simvastatin]     Current Outpatient Prescriptions  Medication Sig Dispense Refill  . acetaminophen (TYLENOL) 500 MG tablet Take 500 mg by mouth every 8 (eight) hours as needed (pain).     . Biotin 1000 MCG tablet Take 1,000 mcg by mouth daily.    . Calcium Carbonate-Vitamin D (CALCIUM 600 + D PO) Take 1 tablet by mouth daily.      Marland Kitchen. estradiol (CLIMARA - DOSED IN MG/24 HR) 0.1 mg/24hr Place 1 patch onto the skin once a week.      . fluticasone (FLONASE) 50 MCG/ACT nasal spray Place 2 sprays into the nose daily as needed.  For nasal congestion     . HYDROcodone-acetaminophen (NORCO/VICODIN) 5-325 MG tablet Take 1 tablet by mouth every 4 (four) hours as needed for moderate pain (Must last 30 days.  Do not take and drive a car or use machinery.). 120 tablet 0  . levothyroxine (SYNTHROID, LEVOTHROID) 75 MCG tablet Take 75 mcg by mouth daily before breakfast.    . Lido-Capsaicin-Men-Methyl Sal (MEDI-PATCH-LIDOCAINE EX) Apply topically as needed.    Marland Kitchen LORazepam (ATIVAN) 0.5 MG tablet Take 1 tablet (0.5 mg total) by mouth 2 (two) times daily as needed for anxiety. 60 tablet 0  . Oxymetazoline HCl (AFRIN NASAL SPRAY NA) Place into the nose as needed.    . pseudoephedrine (SUDAFED) 30 MG tablet Take 30 mg by mouth every 4 (four) hours as needed for congestion.    Marland Kitchen SALINE  NASAL MIST NA Place 1-2 sprays into the nose daily as needed. For congestion     . sertraline (ZOLOFT) 50 MG tablet Take 25 mg by mouth daily.    . Vitamin D, Cholecalciferol, 1000 UNITS TABS Take 1,000 Units by mouth daily.     No current facility-administered medications for this visit.      Physical Exam  Blood pressure 139/78, pulse 66, temperature 97.3 F (36.3 C), height 5\' 3"  (1.6 m), weight 133 lb (60.3 kg).  Constitutional: overall normal hygiene, normal nutrition, well developed, normal grooming, normal body habitus. Assistive device:none  Musculoskeletal: gait and station Limp none, muscle tone and strength are normal, no tremors or atrophy is present.  .  Neurological: coordination overall normal.  Deep tendon reflex/nerve stretch intact.  Sensation normal.  Cranial nerves II-XII intact.   Skin:   normal overall no scars, lesions, ulcers or rashes. No psoriasis.  Psychiatric: Alert and oriented x 3.  Recent memory intact, remote memory unclear.  Normal mood and affect. Well groomed.  Good eye contact.  Cardiovascular: overall no swelling, no varicosities, no edema bilaterally, normal temperatures of the legs and arms, no clubbing, cyanosis and good capillary refill.  Lymphatic: palpation is normal.  She has normal gait and full motion of the right ankle.  NV intact.  Examination of right Upper Extremity is done.  Inspection:   Overall:  Elbow non-tender without crepitus or defects, forearm non-tender without crepitus or defects, wrist non-tender without crepitus or defects, hand non-tender.    Shoulder: with glenohumeral joint tenderness, without effusion.   Upper arm: with swelling and tenderness   Range of motion:   Overall:  Full range of motion of the elbow, full range of motion of wrist and full range of motion in fingers.   Shoulder:  right  165 degrees forward flexion; 145 degrees abduction; 35 degrees internal rotation, 35 degrees external rotation, 15  degrees extension, 40 degrees adduction.   Stability:   Overall:  Shoulder, elbow and wrist stable   Strength and Tone:   Overall full shoulder muscles strength, full upper arm strength and normal upper arm bulk and tone.  X-rays were done of the right ankle and the right shoulder, reported separately.  The patient has been educated about the nature of the problem(s) and counseled on treatment options.  The patient appeared to understand what I have discussed and is in agreement with it.  Encounter Diagnoses  Name Primary?  Marland Kitchen Ankle fracture, lateral malleolus, closed, right, with routine healing, subsequent encounter Yes  . Right shoulder pain    PROCEDURE NOTE:  The patient request injection, verbal consent was obtained.  The  right shoulder was prepped appropriately after time out was performed.   Sterile technique was observed and injection of 1 cc of Depo-Medrol 40 mg with several cc's of plain xylocaine. Anesthesia was provided by ethyl chloride and a 20-gauge needle was used to inject the shoulder area. A posterior approach was used.  The injection was tolerated well.  A band aid dressing was applied.  The patient was advised to apply ice later today and tomorrow to the injection sight as needed.  PLAN Call if any problems.  Precautions discussed.  Continue current medications.   Return to clinic 1 month   Continue the Aleve.  Electronically Signed Darreld Mclean, MD 9/5/201711:05 AM

## 2016-03-16 ENCOUNTER — Ambulatory Visit (INDEPENDENT_AMBULATORY_CARE_PROVIDER_SITE_OTHER): Payer: Medicare Other | Admitting: Ophthalmology

## 2016-03-27 ENCOUNTER — Other Ambulatory Visit (HOSPITAL_COMMUNITY): Payer: Self-pay | Admitting: Family Medicine

## 2016-03-27 DIAGNOSIS — Z1231 Encounter for screening mammogram for malignant neoplasm of breast: Secondary | ICD-10-CM

## 2016-04-10 ENCOUNTER — Ambulatory Visit (INDEPENDENT_AMBULATORY_CARE_PROVIDER_SITE_OTHER): Payer: Medicare Other | Admitting: Ophthalmology

## 2016-04-10 DIAGNOSIS — H33302 Unspecified retinal break, left eye: Secondary | ICD-10-CM

## 2016-04-11 ENCOUNTER — Ambulatory Visit: Payer: Medicare Other | Admitting: Orthopaedic Surgery

## 2016-04-18 ENCOUNTER — Ambulatory Visit: Payer: Medicare Other | Admitting: Orthopaedic Surgery

## 2016-04-20 ENCOUNTER — Encounter (HOSPITAL_COMMUNITY): Payer: Self-pay | Admitting: *Deleted

## 2016-04-20 ENCOUNTER — Emergency Department (HOSPITAL_COMMUNITY)
Admission: EM | Admit: 2016-04-20 | Discharge: 2016-04-20 | Disposition: A | Discharge status: Home / Self Care | Payer: Medicare Other | Attending: Emergency Medicine | Admitting: Emergency Medicine

## 2016-04-20 ENCOUNTER — Emergency Department (HOSPITAL_COMMUNITY): Payer: Medicare Other

## 2016-04-20 DIAGNOSIS — R079 Chest pain, unspecified: Secondary | ICD-10-CM | POA: Diagnosis not present

## 2016-04-20 DIAGNOSIS — Z79899 Other long term (current) drug therapy: Secondary | ICD-10-CM | POA: Diagnosis not present

## 2016-04-20 DIAGNOSIS — F410 Panic disorder [episodic paroxysmal anxiety] without agoraphobia: Secondary | ICD-10-CM | POA: Diagnosis not present

## 2016-04-20 DIAGNOSIS — I10 Essential (primary) hypertension: Secondary | ICD-10-CM | POA: Insufficient documentation

## 2016-04-20 DIAGNOSIS — R0789 Other chest pain: Secondary | ICD-10-CM | POA: Diagnosis present

## 2016-04-20 DIAGNOSIS — Z87891 Personal history of nicotine dependence: Secondary | ICD-10-CM | POA: Diagnosis not present

## 2016-04-20 LAB — BASIC METABOLIC PANEL
ANION GAP: 9 (ref 5–15)
BUN: 20 mg/dL (ref 6–20)
CHLORIDE: 103 mmol/L (ref 101–111)
CO2: 23 mmol/L (ref 22–32)
Calcium: 9.1 mg/dL (ref 8.9–10.3)
Creatinine, Ser: 0.78 mg/dL (ref 0.44–1.00)
GFR calc non Af Amer: >60 mL/min (ref >60)
Glucose, Bld: 132 mg/dL — ABNORMAL HIGH (ref 65–99)
POTASSIUM: 3.8 mmol/L (ref 3.5–5.1)
Sodium: 135 mmol/L (ref 135–145)

## 2016-04-20 LAB — CBC
HEMATOCRIT: 43.4 % (ref 36.0–46.0)
HEMOGLOBIN: 14.2 g/dL (ref 12.0–15.0)
MCH: 29.5 pg (ref 26.0–34.0)
MCHC: 32.7 g/dL (ref 30.0–36.0)
MCV: 90 fL (ref 78.0–100.0)
Platelets: 179 10*3/uL (ref 150–400)
RBC: 4.82 MIL/uL (ref 3.87–5.11)
RDW: 13.7 % (ref 11.5–15.5)
WBC: 10.4 10*3/uL (ref 4.0–10.5)

## 2016-04-20 LAB — TROPONIN I: Troponin I: <0.03 ng/mL (ref <0.03)

## 2016-04-20 MED ORDER — FENTANYL CITRATE (PF) 100 MCG/2ML IJ SOLN
50.0000 ug | Freq: Once | INTRAMUSCULAR | Status: AC
  Administered 2016-04-20: 50 ug via INTRAVENOUS
  Filled 2016-04-20: qty 2

## 2016-04-20 MED ORDER — LORAZEPAM 2 MG/ML IJ SOLN
1.0000 mg | Freq: Once | INTRAMUSCULAR | Status: AC
  Administered 2016-04-20: 1 mg via INTRAVENOUS
  Filled 2016-04-20: qty 1

## 2016-04-20 MED ORDER — SODIUM CHLORIDE 0.9 % IV BOLUS (SEPSIS)
500.0000 mL | Freq: Once | INTRAVENOUS | Status: AC
  Administered 2016-04-20: 500 mL via INTRAVENOUS

## 2016-04-20 MED ORDER — ONDANSETRON HCL 4 MG/2ML IJ SOLN
4.0000 mg | Freq: Once | INTRAMUSCULAR | Status: AC | PRN
  Administered 2016-04-20: 4 mg via INTRAVENOUS
  Filled 2016-04-20: qty 2

## 2016-04-20 MED ORDER — FAMOTIDINE IN NACL 20-0.9 MG/50ML-% IV SOLN
20.0000 mg | Freq: Two times a day (BID) | INTRAVENOUS | Status: DC
  Administered 2016-04-20: 20 mg via INTRAVENOUS
  Filled 2016-04-20: qty 50

## 2016-04-20 MED ORDER — PANTOPRAZOLE SODIUM 20 MG PO TBEC: 20.0000 mg | DELAYED_RELEASE_TABLET | Freq: Every day | ORAL | 0 refills | Status: DC

## 2016-04-20 NOTE — ED Notes (Signed)
Unable to get blood at this time. IV established.

## 2016-04-20 NOTE — Discharge Instructions (Signed)
The test today to evaluate for heart and lung problems were normal.  Your symptoms are likely caused by a panic attack, stimulated by the stress that you are experiencing.  You may also have some gastric acid or reflux problems as well. We will prescribe Protonix, to help with that. It will also help to use Maalox, before meals and at bedtime for about a week.  Get plenty of rest, talk to family and friends about your feelings, and follow-up with your primary care doctor for checkup.

## 2016-04-20 NOTE — ED Triage Notes (Signed)
Pt comes in with chest pain starting around 3 hours ago. Pt states this pain is in her center and left chest with movement into her back. She took alka-seltzer with no relief. She has had nausea/vomiting as well.

## 2016-04-20 NOTE — ED Notes (Signed)
Pt expresses that she lost her husband one year ago and lost her daughter 4 weeks ago. She was writing thank you notes  today when she began to feel this pain.

## 2016-04-20 NOTE — ED Provider Notes (Addendum)
[facility] Provider Note   CSN: [id number] Arrival date & time: 04/20/16  1739     History   Chief Complaint Chief Complaint  Patient presents with  . Chest Pain    Anne Davenport is a 75 y.o. female.  She presents for evaluation of pain in the middle of her back, associated with left chest tightness, present for several hours, today. The pain makes it hard to breathe. She denies cough or shortness of breath. No similar problems in the past. She has been under stress because of the death of her daughter, four weeks ago and her husband 1 year ago. She denies fever, chills, nausea, vomiting, weakness or dizziness. There are no other known modifying factors.  Anne  Past Medical History:  Diagnosis Date  . Anxiety   . Back pain    mid back  . Bartholin cyst 05/20/2015  . Family history of adverse reaction to anesthesia    Daughter stopped breathing with Ativan  . 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 Hyperlipemia   . Hypertension   . IBS (irritable bowel syndrome)   . 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 Screening for STD (sexually transmitted disease) 07/23/2015    Patient Active Problem List   Diagnosis Date Noted  . Right knee pain 08/18/2015  . Right shoulder pain 08/18/2015  . 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 Screening for STD (sexually transmitted disease) 07/23/2015  . Bartholin cyst 05/20/2015  . 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 Iatrogenic hypothyroidism 04/09/2015  . High cholesterol 09/07/2014    Past Surgical History:  Procedure Laterality Date  . ABDOMINAL HYSTERECTOMY    . BLADDER REPAIR    . COLONOSCOPY N/A 06/24/2015   Procedure: COLONOSCOPY;  Surgeon: Malissa Hippo, MD;  Location: [facility];  Service: Endoscopy;  Laterality: N/A;  730  . ESOPHAGOGASTRODUODENOSCOPY N/A 08/14/2014   Procedure: ESOPHAGOGASTRODUODENOSCOPY  (EGD);  Surgeon: Malissa Hippo, MD;  Location: [facility];  Service: Endoscopy;  Laterality: N/A;  1:10  . TONSILLECTOMY    . WRIST SURGERY Left     OB History    Gravida Para Term Preterm AB Living   4 3     1 3    SAB TAB Ectopic Multiple Live Births   1               Home Medications    Prior to Admission medications   Medication Sig Start Date End Date Taking? Authorizing Provider  acetaminophen (TYLENOL) 500 MG tablet Take 500 mg by mouth every 6 (six) hours as needed for mild pain, moderate pain or headache.    Yes Historical Provider, MD  Biotin 1000 MCG tablet Take 1,000 mcg by mouth at bedtime.    Yes Historical Provider, MD  Calcium Carbonate-Vitamin D (CALCIUM 600+D) 600-400 MG-UNIT tablet Take 1 tablet by mouth daily.   Yes Historical Provider, MD  cholecalciferol (VITAMIN D) 1000 units tablet Take 1,000 Units by mouth daily with lunch.   Yes Historical Provider, MD  estradiol (CLIMARA - DOSED IN MG/24 HR) 0.1 mg/24hr Place 1 patch onto the skin every Saturday.    Yes Historical Provider, MD  fluticasone (FLONASE) 50 MCG/ACT nasal spray Place 2 sprays into the nose daily as needed for rhinitis.    Yes Historical Provider, MD  levothyroxine (SYNTHROID, LEVOTHROID) 75 MCG tablet Take 75 mcg by mouth at bedtime.    Yes Historical Provider,  MD  LORazepam (ATIVAN) 0.5 MG tablet Take 1 tablet (0.5 mg total) by mouth 2 (two) times daily as needed for anxiety. 08/14/14  Yes Malissa Hippo, MD  oxymetazoline (AFRIN) 0.05 % nasal spray Place 1 spray into both nostrils 2 (two) times daily as needed for congestion.   Yes Historical Provider, MD  pseudoephedrine (SUDAFED) 30 MG tablet Take 30 mg by mouth every 4 (four) hours as needed for congestion.   Yes Historical Provider, MD  sertraline (ZOLOFT) 50 MG tablet Take 25 mg by mouth at bedtime.    Yes Historical Provider, MD  pantoprazole (PROTONIX) 20 MG tablet Take 1 tablet (20 mg total) by mouth daily. 04/20/16   Mancel Bale, MD      Family History Family History  Problem Relation Age of Onset  . Diabetes Mother   . Hypertension Mother   . Alcohol abuse Brother   . Cancer Daughter     breast  . Hodgkin's lymphoma Daughter   . Diabetes Maternal Grandfather   . Heart disease Maternal Grandfather   . Other Paternal Grandfather     poor circulation    Social History Social History  Substance Use Topics  . Smoking status: Former Smoker    Packs/day: 0.50    Years: 10.00    Types: Cigarettes    Quit date: 07/09/1984  . Smokeless tobacco: Never Used  . Alcohol use 0.0 oz/week     Comment: "once in a while"     Allergies   Codeine; Molds & smuts; Sulfa antibiotics; and Zocor [simvastatin]   Review of Systems Review of Systems  All other systems reviewed and are negative.    Physical Exam Updated Vital Signs BP 156/63   Pulse 99   Temp 97.4 F (36.3 C) (Oral)   Resp (!) 29   Ht 5\' 3"  (1.6 m)   Wt 133 lb (60.3 kg)   SpO2 99%   BMI 23.56 kg/m   Physical Exam  Constitutional: She is oriented to person, place, and time. She appears well-developed and well-nourished. She appears distressed (Uncomfortable, tearful).  HENT:  Head: Normocephalic and atraumatic.  Eyes: Conjunctivae and EOM are normal. Pupils are equal, round, and reactive to light.  Neck: Normal range of motion and phonation normal. Neck supple.  Cardiovascular: Normal rate and regular rhythm.   Pulmonary/Chest: Effort normal and breath sounds normal. She exhibits no tenderness.  Abdominal: Soft. She exhibits no distension. There is no tenderness. There is no guarding.  Musculoskeletal: Normal range of motion. She exhibits no tenderness or deformity.  Neurological: She is alert and oriented to person, place, and time. She exhibits normal muscle tone.  Skin: Skin is warm and dry.  Psychiatric: Her behavior is normal. Judgment and thought content normal.  Anxious  Nursing note and vitals reviewed.    ED Treatments / Results   Labs (all labs ordered are listed, but only abnormal results are displayed) Labs Reviewed  BASIC METABOLIC PANEL - Abnormal; Notable for the following:       Result Value   Glucose, Bld 132 (*)    All other components within normal limits  CBC  TROPONIN I    EKG  EKG Interpretation  Date/Time:  Thursday April 20 2016 17:45:24 EDT Ventricular Rate:  88 PR Interval:    QRS Duration: 76 QT Interval:  378 QTC Calculation: 458 R Axis:   43 Text Interpretation:  Sinus rhythm Abnormal R-wave progression, early transition since last tracing no significant change Confirmed by  Kemper.Land  MD, Mechele Collin [phone]) on 04/20/2016 5:58:46 PM       Radiology Dg Chest 2 View  Result Date: 04/20/2016 CLINICAL DATA:  Left-sided chest tightness and interscapular pain, 3 hours duration. EXAM: CHEST  2 VIEW COMPARISON:  05/31/2015.  01/21/2014.  02/24/2013. FINDINGS: Heart size is normal. Mediastinal shadows are normal. There is mild scarring in the upper lobes with mild chronic volume loss. No evidence of active infiltrate, mass, effusion or collapse. No significant bone finding. IMPRESSION: No active disease. Mild scarring in the upper lobes with chronic volume loss. Electronically Signed   By: Paulina Fusi M.D.   On: 04/20/2016 18:19    Procedures Procedures (including critical care time)  Medications Ordered in ED Medications  famotidine (PEPCID) IVPB 20 mg premix (0 mg Intravenous Stopped 04/20/16 2013)  ondansetron (ZOFRAN) injection 4 mg (4 mg Intravenous Given 04/20/16 1756)  LORazepam (ATIVAN) injection 1 mg (1 mg Intravenous Given 04/20/16 1920)  sodium chloride 0.9 % bolus 500 mL (0 mLs Intravenous Stopped 04/20/16 2014)  fentaNYL (SUBLIMAZE) injection 50 mcg (50 mcg Intravenous Given 04/20/16 1919)     Initial Impression / Assessment and Plan / ED Course  I have reviewed the triage vital signs and the nursing notes.  Pertinent labs & imaging results that were available during my  care of the patient were reviewed by me and considered in my medical decision making (see chart for details).  Clinical Course  Value Comment By Time  EKG 12-Lead (Reviewed) Mancel Bale, MD 10/12 1839    Medications  famotidine (PEPCID) IVPB 20 mg premix (0 mg Intravenous Stopped 04/20/16 2013)  ondansetron (ZOFRAN) injection 4 mg (4 mg Intravenous Given 04/20/16 1756)  LORazepam (ATIVAN) injection 1 mg (1 mg Intravenous Given 04/20/16 1920)  sodium chloride 0.9 % bolus 500 mL (0 mLs Intravenous Stopped 04/20/16 2014)  fentaNYL (SUBLIMAZE) injection 50 mcg (50 mcg Intravenous Given 04/20/16 1919)    Patient Vitals for the past 24 hrs:  BP Temp Temp src Pulse Resp SpO2 Height Weight  04/20/16 2032 - - - 99 (!) 29 99 % - -  04/20/16 2030 156/63 - - 97 25 95 % - -  04/20/16 2013 - - - 100 21 98 % - -  04/20/16 2000 148/69 - - 93 22 (!) 82 % - -  04/20/16 1900 112/98 - - - (!) 28 - - -  04/20/16 1800 156/75 - - 76 21 97 % - -  04/20/16 1748 161/78 97.4 F (36.3 C) Oral 112 18 100 % - -  04/20/16 1746 - - - - - - 5\' 3"  (1.6 m) 133 lb (60.3 kg)    8:35 PM Reevaluation with update and discussion. After initial assessment and treatment, an updated evaluation reveals She is comfortable at this time. Friends with the family state that she vomited after she was initially seen and that made her feel somewhat better. Findings discussed with the patient and all questions were answered. Saliah Crisp L    Final Clinical Impressions(s) / ED Diagnoses   Final diagnoses:  Nonspecific chest pain  Panic attack   Anxiety secondary chest and back discomfort, likely related to a panic attack. Improved with symptomatic treatment. Possible associated peptic ulcer disease or esophageal reflux. Doubt ACS, PE or pneumonia.  Nursing Notes Reviewed/ Care Coordinated Applicable Imaging Reviewed Interpretation of Laboratory Data incorporated into ED treatment  The patient appears reasonably screened  and/or stabilized for discharge and I doubt any other medical condition or other  EMC requiring further screening, evaluation, or treatment in the ED at this time prior to discharge.  Plan: Home Medications- continue; Home Treatments- rest; return here if the recommended treatment, does not improve the symptoms; Recommended follow up- PCP 1 week for check up   New Prescriptions New Prescriptions   PANTOPRAZOLE (PROTONIX) 20 MG TABLET    Take 1 tablet (20 mg total) by mouth daily.     Mancel BaleElliott Coury Grieger, MD 04/20/16 2038    Mancel BaleElliott Acadia Thammavong, MD 04/20/16 2041

## 2016-04-20 NOTE — ED Notes (Signed)
Lab at bedside

## 2016-04-26 ENCOUNTER — Ambulatory Visit (INDEPENDENT_AMBULATORY_CARE_PROVIDER_SITE_OTHER): Payer: Medicare Other

## 2016-04-26 ENCOUNTER — Telehealth: Payer: Self-pay | Admitting: Orthopaedic Surgery

## 2016-04-26 ENCOUNTER — Encounter: Payer: Self-pay | Admitting: Orthopaedic Surgery

## 2016-04-26 ENCOUNTER — Ambulatory Visit (INDEPENDENT_AMBULATORY_CARE_PROVIDER_SITE_OTHER): Payer: Medicare Other | Admitting: Orthopaedic Surgery

## 2016-04-26 VITALS — BP 144/67 | HR 73 | Ht 63.0 in | Wt 132.0 lb

## 2016-04-26 DIAGNOSIS — M25511 Pain in right shoulder: Secondary | ICD-10-CM | POA: Diagnosis not present

## 2016-04-26 DIAGNOSIS — M545 Low back pain, unspecified: Secondary | ICD-10-CM

## 2016-04-26 DIAGNOSIS — G8929 Other chronic pain: Secondary | ICD-10-CM | POA: Diagnosis not present

## 2016-04-26 NOTE — Progress Notes (Signed)
Patient ZO:XWRU:Anne Davenport, female DOB:1940-10-31, 75 y.o. EAV:[id number]  Chief Complaint  Patient presents with  . Follow-up    Right shoulder and low back pain    HPI  Anne Davenport is a 75 y.o. female who has right shoulder pain.  It is much better since the injection last time.  She is moving it well.  She has recurrent lower back pain.  She has pain in the mid lower back with no radiation.  It got intense last week.  She was seen in the ER.  They said she had indigestion and panic attack.  She lost a daughter last month.  She has no new trauma.  She says she has some spasm in the lower back at times.  She is getting worse.   I will get a MRI of the lumbar spine.  I had told her in the recent past if the back started bothering her bad again we would do that.  She is very agreeable to this. HPI  Body mass index is 23.38 kg/m.  ROS  Review of Systems  Respiratory: Negative for cough and shortness of breath.   Cardiovascular: Negative for chest pain.  Endocrine: Positive for cold intolerance.  Musculoskeletal: Positive for arthralgias, gait problem and joint swelling.  Allergic/Immunologic: Positive for environmental allergies.    Past Medical History:  Diagnosis Date  . Anxiety   . Back pain    mid back  . Bartholin cyst 05/20/2015  . Family history of adverse reaction to anesthesia    Daughter stopped breathing with Ativan  . 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 Hyperlipemia   . Hypertension   . IBS (irritable bowel syndrome)   . 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 Screening for STD (sexually transmitted disease) 07/23/2015    Past Surgical History:  Procedure Laterality Date  . ABDOMINAL HYSTERECTOMY    . BLADDER REPAIR    . COLONOSCOPY N/A 06/24/2015   Procedure: COLONOSCOPY;  Surgeon: Malissa HippoNajeeb U Rehman, MD;  Location: [facility];  Service: Endoscopy;  Laterality: N/A;  730  . ESOPHAGOGASTRODUODENOSCOPY N/A  08/14/2014   Procedure: ESOPHAGOGASTRODUODENOSCOPY (EGD);  Surgeon: Malissa HippoNajeeb U Rehman, MD;  Location: [facility];  Service: Endoscopy;  Laterality: N/A;  1:10  . TONSILLECTOMY    . WRIST SURGERY Left     Family History  Problem Relation Age of Onset  . Diabetes Mother   . Hypertension Mother   . Alcohol abuse Brother   . Cancer Daughter     breast  . Hodgkin's lymphoma Daughter   . Diabetes Maternal Grandfather   . Heart disease Maternal Grandfather   . Other Paternal Grandfather     poor circulation    Social History Social History  Substance Use Topics  . Smoking status: Former Smoker    Packs/day: 0.50    Years: 10.00    Types: Cigarettes    Quit date: 07/09/1984  . Smokeless tobacco: Never Used  . Alcohol use 0.0 oz/week     Comment: "once in a while"    Allergies  Allergen Reactions  . Codeine Itching and Nausea And Vomiting  . Molds & Smuts Other (See Comments)    Reaction:  Itchy, watery eyes   . Sulfa Antibiotics Nausea And Vomiting and Rash  . Zocor [Simvastatin] Nausea And Vomiting and Rash    Current Outpatient Prescriptions  Medication Sig Dispense Refill  . acetaminophen (TYLENOL) 500 MG tablet Take 500 mg by mouth  every 6 (six) hours as needed for mild pain, moderate pain or headache.     . Biotin 1000 MCG tablet Take 1,000 mcg by mouth at bedtime.     . Calcium Carbonate-Vitamin D (CALCIUM 600+D) 600-400 MG-UNIT tablet Take 1 tablet by mouth daily.    . cholecalciferol (VITAMIN D) 1000 units tablet Take 1,000 Units by mouth daily with lunch.    . estradiol (CLIMARA - DOSED IN MG/24 HR) 0.1 mg/24hr Place 1 patch onto the skin every Saturday.     . fluticasone (FLONASE) 50 MCG/ACT nasal spray Place 2 sprays into the nose daily as needed for rhinitis.     Marland Kitchen levothyroxine (SYNTHROID, LEVOTHROID) 75 MCG tablet Take 75 mcg by mouth at bedtime.     Marland Kitchen LORazepam (ATIVAN) 0.5 MG tablet Take 1 tablet (0.5 mg total) by mouth 2 (two) times daily as needed for  anxiety. 60 tablet 0  . oxymetazoline (AFRIN) 0.05 % nasal spray Place 1 spray into both nostrils 2 (two) times daily as needed for congestion.    . pantoprazole (PROTONIX) 20 MG tablet Take 1 tablet (20 mg total) by mouth daily. 30 tablet 0  . pseudoephedrine (SUDAFED) 30 MG tablet Take 30 mg by mouth every 4 (four) hours as needed for congestion.    . sertraline (ZOLOFT) 50 MG tablet Take 25 mg by mouth at bedtime.      No current facility-administered medications for this visit.      Physical Exam  Blood pressure (!) 144/67, pulse 73, height 5\' 3"  (1.6 m), weight 132 lb (59.9 kg).  Constitutional: overall normal hygiene, normal nutrition, well developed, normal grooming, normal body habitus. Assistive device:none  Musculoskeletal: gait and station Limp none, muscle tone and strength are normal, no tremors or atrophy is present.  .  Neurological: coordination overall normal.  Deep tendon reflex/nerve stretch intact.  Sensation normal.  Cranial nerves II-XII intact.   Skin:   Normal overall no scars, lesions, ulcers or rashes. No psoriasis.  Psychiatric: Alert and oriented x 3.  Recent memory intact, remote memory unclear.  Normal mood and affect. Well groomed.  Good eye contact.  Cardiovascular: overall no swelling, no varicosities, no edema bilaterally, normal temperatures of the legs and arms, no clubbing, cyanosis and good capillary refill.  Lymphatic: palpation is normal.  Examination of right Upper Extremity is done.  Inspection:   Overall:  Elbow non-tender without crepitus or defects, forearm non-tender without crepitus or defects, wrist non-tender without crepitus or defects, hand non-tender.    Shoulder: without glenohumeral joint tenderness, without effusion.   Upper arm: without swelling and tenderness   Range of motion:   Overall:  Full range of motion of the elbow, full range of motion of wrist and full range of motion in fingers.   Shoulder:  right  full degrees  forward flexion; full degrees abduction; full degrees internal rotation, full degrees external rotation, full degrees extension, full degrees adduction.   Stability:   Overall:  Shoulder, elbow and wrist stable   Strength and Tone:   Overall full shoulder muscles strength, full upper arm strength and normal upper arm bulk and tone.  Spine/Pelvis examination:  Inspection:  Overall, sacoiliac joint benign and hips nontender; without crepitus or defects.   Thoracic spine inspection: Alignment normal without kyphosis present   Lumbar spine inspection:  Alignment  with normal lumbar lordosis, without scoliosis apparent.   Thoracic spine palpation:  without tenderness of spinal processes   Lumbar spine palpation: with  tenderness of lumbar area; without tightness of lumbar muscles    Range of Motion:   Lumbar flexion, forward flexion is 35 without pain or tenderness    Lumbar extension is 5 without pain or tenderness   Left lateral bend is Normal  without pain or tenderness   Right lateral bend is Normal without pain or tenderness   Straight leg raising is Normal   Strength & tone: Normal   Stability overall normal stability   X-rays were done of the lumbar spine, reported separately.  The patient has been educated about the nature of the problem(s) and counseled on treatment options.  The patient appeared to understand what I have discussed and is in agreement with it.  Encounter Diagnoses  Name Primary?  . Chronic midline low back pain without sciatica Yes  . Chronic right shoulder pain     PLAN Call if any problems.  Precautions discussed.  Continue current medications.   Return to clinic get MRI of the lumbar spine   Electronically Signed Darreld Mclean, MD 10/18/201710:40 AM

## 2016-04-27 ENCOUNTER — Ambulatory Visit (HOSPITAL_COMMUNITY)
Admission: RE | Admit: 2016-04-27 | Discharge: 2016-04-27 | Disposition: A | Discharge status: Home / Self Care | Payer: Medicare Other | Source: Ambulatory Visit | Attending: Orthopaedic Surgery | Admitting: Orthopaedic Surgery

## 2016-04-27 DIAGNOSIS — G8929 Other chronic pain: Secondary | ICD-10-CM | POA: Insufficient documentation

## 2016-04-27 DIAGNOSIS — M545 Low back pain, unspecified: Secondary | ICD-10-CM

## 2016-04-27 DIAGNOSIS — M4807 Spinal stenosis, lumbosacral region: Secondary | ICD-10-CM | POA: Insufficient documentation

## 2016-04-27 DIAGNOSIS — M4186 Other forms of scoliosis, lumbar region: Secondary | ICD-10-CM | POA: Diagnosis not present

## 2016-04-27 DIAGNOSIS — M4686 Other specified inflammatory spondylopathies, lumbar region: Secondary | ICD-10-CM | POA: Diagnosis not present

## 2016-04-27 DIAGNOSIS — M5126 Other intervertebral disc displacement, lumbar region: Secondary | ICD-10-CM | POA: Diagnosis not present

## 2016-05-01 ENCOUNTER — Ambulatory Visit (HOSPITAL_COMMUNITY)
Admission: RE | Admit: 2016-05-01 | Discharge: 2016-05-01 | Disposition: A | Discharge status: Home / Self Care | Payer: Medicare Other | Source: Ambulatory Visit | Attending: Family Medicine | Admitting: Family Medicine

## 2016-05-01 DIAGNOSIS — Z1231 Encounter for screening mammogram for malignant neoplasm of breast: Secondary | ICD-10-CM | POA: Diagnosis not present

## 2016-05-02 ENCOUNTER — Ambulatory Visit (INDEPENDENT_AMBULATORY_CARE_PROVIDER_SITE_OTHER): Payer: Medicare Other | Admitting: Orthopaedic Surgery

## 2016-05-02 ENCOUNTER — Encounter: Payer: Self-pay | Admitting: Orthopaedic Surgery

## 2016-05-02 VITALS — BP 120/74 | HR 86 | Ht 63.0 in | Wt 132.0 lb

## 2016-05-02 DIAGNOSIS — M545 Low back pain, unspecified: Secondary | ICD-10-CM

## 2016-05-02 DIAGNOSIS — M25511 Pain in right shoulder: Secondary | ICD-10-CM

## 2016-05-02 DIAGNOSIS — G8929 Other chronic pain: Secondary | ICD-10-CM | POA: Diagnosis not present

## 2016-05-02 NOTE — Progress Notes (Signed)
Patient Anne Davenport, female DOB:Dec 22, 1940, 75 y.o. PNT:[id number]  Chief Complaint  Patient presents with  . Results    MRI L Spine    HPI  Anne Davenport is a 75 y.o. female who has increasing pain in the lower back.  She had a MRI which shows: IMPRESSION: 1. 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 2. Progressive disc bulge asymmetric to the left at L4-5 without neural impingement. 3. Slight progression of lumbar scoliosis. 4. 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  I have explained the findings to her.  I used a model.  I will have neurosurgeon see her.  She is having more pain in the right shoulder again.  It hurts with overhead use.  She has no trauma or paresthesias. HPI  Body mass index is 23.38 kg/m.  ROS  Review of Systems  Respiratory: Negative for cough and shortness of breath.   Cardiovascular: Negative for chest pain.  Endocrine: Positive for cold intolerance.  Musculoskeletal: Positive for arthralgias, gait problem and joint swelling.  Allergic/Immunologic: Positive for environmental allergies.    Past Medical History:  Diagnosis Date  . Anxiety   . Back pain    mid back  . Bartholin cyst 05/20/2015  . Family history of adverse reaction to anesthesia    Daughter stopped breathing with Ativan  . 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 Hyperlipemia   . Hypertension   . IBS (irritable bowel syndrome)   . 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 Screening for STD (sexually transmitted disease) 07/23/2015    Past Surgical History:  Procedure Laterality Date  . ABDOMINAL HYSTERECTOMY    . BLADDER REPAIR    . COLONOSCOPY N/A 06/24/2015   Procedure: COLONOSCOPY;  Surgeon: Malissa Hippo, MD;  Location: [facility];  Service: Endoscopy;  Laterality: N/A;  730  . ESOPHAGOGASTRODUODENOSCOPY N/A 08/14/2014   Procedure:  ESOPHAGOGASTRODUODENOSCOPY (EGD);  Surgeon: Malissa Hippo, MD;  Location: [facility];  Service: Endoscopy;  Laterality: N/A;  1:10  . TONSILLECTOMY    . WRIST SURGERY Left     Family History  Problem Relation Age of Onset  . Diabetes Mother   . Hypertension Mother   . Alcohol abuse Brother   . Cancer Daughter     breast  . Hodgkin's lymphoma Daughter   . Diabetes Maternal Grandfather   . Heart disease Maternal Grandfather   . Other Paternal Grandfather     poor circulation    Social History Social History  Substance Use Topics  . Smoking status: Former Smoker    Packs/day: 0.50    Years: 10.00    Types: Cigarettes    Quit date: 07/09/1984  . Smokeless tobacco: Never Used  . Alcohol use 0.0 oz/week     Comment: "once in a while"    Allergies  Allergen Reactions  . Codeine Itching and Nausea And Vomiting  . Molds & Smuts Other (See Comments)    Reaction:  Itchy, watery eyes   . Sulfa Antibiotics Nausea And Vomiting and Rash  . Zocor [Simvastatin] Nausea And Vomiting and Rash    Current Outpatient Prescriptions  Medication Sig Dispense Refill  . acetaminophen (TYLENOL) 500 MG tablet Take 500 mg by mouth every 6 (six) hours as needed for mild pain, moderate pain or headache.     . Biotin 1000 MCG tablet Take 1,000 mcg by mouth at bedtime.     Marland Kitchen  Calcium Carbonate-Vitamin D (CALCIUM 600+D) 600-400 MG-UNIT tablet Take 1 tablet by mouth daily.    . cholecalciferol (VITAMIN D) 1000 units tablet Take 1,000 Units by mouth daily with lunch.    . estradiol (CLIMARA - DOSED IN MG/24 HR) 0.1 mg/24hr Place 1 patch onto the skin every Saturday.     . fluticasone (FLONASE) 50 MCG/ACT nasal spray Place 2 sprays into the nose daily as needed for rhinitis.     Marland Kitchen levothyroxine (SYNTHROID, LEVOTHROID) 75 MCG tablet Take 75 mcg by mouth at bedtime.     Marland Kitchen LORazepam (ATIVAN) 0.5 MG tablet Take 1 tablet (0.5 mg total) by mouth 2 (two) times daily as needed for anxiety. 60 tablet 0  .  oxymetazoline (AFRIN) 0.05 % nasal spray Place 1 spray into both nostrils 2 (two) times daily as needed for congestion.    . pantoprazole (PROTONIX) 20 MG tablet Take 1 tablet (20 mg total) by mouth daily. 30 tablet 0  . pseudoephedrine (SUDAFED) 30 MG tablet Take 30 mg by mouth every 4 (four) hours as needed for congestion.    . sertraline (ZOLOFT) 50 MG tablet Take 25 mg by mouth at bedtime.      No current facility-administered medications for this visit.      Physical Exam  Blood pressure 120/74, pulse 86, height 5\' 3"  (1.6 m), weight 132 lb (59.9 kg).  Constitutional: overall normal hygiene, normal nutrition, well developed, normal grooming, normal body habitus. Assistive device:none  Musculoskeletal: gait and station Limp none, muscle tone and strength are normal, no tremors or atrophy is present.  .  Neurological: coordination overall normal.  Deep tendon reflex/nerve stretch intact.  Sensation normal.  Cranial nerves II-XII intact.   Skin:   Normal overall no scars, lesions, ulcers or rashes. No psoriasis.  Psychiatric: Alert and oriented x 3.  Recent memory intact, remote memory unclear.  Normal mood and affect. Well groomed.  Good eye contact.  Cardiovascular: overall no swelling, no varicosities, no edema bilaterally, normal temperatures of the legs and arms, no clubbing, cyanosis and good capillary refill.  Lymphatic: palpation is normal.  Examination of right Upper Extremity is done.  Inspection:   Overall:  Elbow non-tender without crepitus or defects, forearm non-tender without crepitus or defects, wrist non-tender without crepitus or defects, hand non-tender.    Shoulder: with glenohumeral joint tenderness, without effusion.   Upper arm: without swelling and tenderness   Range of motion:   Overall:  Full range of motion of the elbow, full range of motion of wrist and full range of motion in fingers.   Shoulder:  right  165 degrees forward flexion; 145 degrees  abduction; 35 degrees internal rotation, 35 degrees external rotation, 15 degrees extension, 40 degrees adduction.   Stability:   Overall:  Shoulder, elbow and wrist stable   Strength and Tone:   Overall full shoulder muscles strength, full upper arm strength and normal upper arm bulk and tone.  Spine/Pelvis examination:  Inspection:  Overall, sacoiliac joint benign and hips nontender; without crepitus or defects.   Thoracic spine inspection: Alignment normal without kyphosis present   Lumbar spine inspection:  Alignment  with normal lumbar lordosis, without scoliosis apparent.   Thoracic spine palpation:  without tenderness of spinal processes   Lumbar spine palpation: with tenderness of lumbar area; without tightness of lumbar muscles    Range of Motion:   Lumbar flexion, forward flexion is 35 without pain or tenderness    Lumbar extension is 5 without  pain or tenderness   Left lateral bend is Normal  without pain or tenderness   Right lateral bend is Normal without pain or tenderness   Straight leg raising is Normal   Strength & tone: Normal   Stability overall normal stability    The patient has been educated about the nature of the problem(s) and counseled on treatment options.  The patient appeared to understand what I have discussed and is in agreement with it.  Encounter Diagnoses  Name Primary?  . Chronic midline low back pain without sciatica Yes  . Chronic right shoulder pain    PROCEDURE NOTE:  The patient request injection, verbal consent was obtained.  The right shoulder was prepped appropriately after time out was performed.   Sterile technique was observed and injection of 1 cc of Depo-Medrol 40 mg with several cc's of plain xylocaine. Anesthesia was provided by ethyl chloride and a 20-gauge needle was used to inject the shoulder area. A posterior approach was used.  The injection was tolerated well.  A band aid dressing was applied.  The patient was  advised to apply ice later today and tomorrow to the injection sight as needed.   PLAN Call if any problems.  Precautions discussed.  Continue current medications.   Return to clinic 1 month   To see neurosurgeon.  Electronically Signed Darreld McleanWayne Wilman Tucker, MD 10/24/20172:56 PM

## 2016-05-02 NOTE — Patient Instructions (Signed)
Referred to Neurosurgeon for low back.

## 2016-05-30 ENCOUNTER — Ambulatory Visit: Payer: Medicare Other | Admitting: Orthopaedic Surgery

## 2016-07-27 ENCOUNTER — Ambulatory Visit: Payer: Medicare Other | Admitting: Orthopaedic Surgery

## 2016-08-02 ENCOUNTER — Ambulatory Visit (INDEPENDENT_AMBULATORY_CARE_PROVIDER_SITE_OTHER): Payer: Medicare Other | Admitting: Orthopaedic Surgery

## 2016-08-02 DIAGNOSIS — M25511 Pain in right shoulder: Secondary | ICD-10-CM

## 2016-08-02 DIAGNOSIS — G8929 Other chronic pain: Secondary | ICD-10-CM | POA: Diagnosis not present

## 2016-08-02 MED ORDER — HYDROCODONE-ACETAMINOPHEN 5-325 MG PO TABS: 1.0000 | ORAL_TABLET | Freq: Four times a day (QID) | ORAL | 0 refills | Status: DC | PRN

## 2016-08-02 NOTE — Progress Notes (Signed)
PROCEDURE NOTE:  The patient request injection, verbal consent was obtained.  The right shoulder was prepped appropriately after time out was performed.   Sterile technique was observed and injection of 1 cc of Depo-Medrol 40 mg with several cc's of plain xylocaine. Anesthesia was provided by ethyl chloride and a 20-gauge needle was used to inject the shoulder area. A posterior approach was used.  The injection was tolerated well.  A band aid dressing was applied.  The patient was advised to apply ice later today and tomorrow to the injection sight as needed.  She has pain and I have given pain medicine after first checking the state narcotic site.  I will begin OT for the shoulder.  Return in two weeks.  Encounter Diagnosis  Name Primary?  . Chronic right shoulder pain Yes   Electronically Signed Darreld McleanWayne Myrian Botello, MD 1/24/201811:11 AM

## 2016-08-14 ENCOUNTER — Ambulatory Visit (INDEPENDENT_AMBULATORY_CARE_PROVIDER_SITE_OTHER): Payer: Medicare Other | Admitting: Ophthalmology

## 2016-08-28 ENCOUNTER — Ambulatory Visit (INDEPENDENT_AMBULATORY_CARE_PROVIDER_SITE_OTHER): Payer: Medicare Other | Admitting: Ophthalmology

## 2016-09-05 ENCOUNTER — Ambulatory Visit: Payer: Medicare Other | Admitting: Orthopaedic Surgery

## 2016-09-20 ENCOUNTER — Ambulatory Visit: Payer: Medicare Other | Admitting: Orthopaedic Surgery

## 2016-09-26 ENCOUNTER — Ambulatory Visit (INDEPENDENT_AMBULATORY_CARE_PROVIDER_SITE_OTHER): Payer: Medicare Other | Admitting: Ophthalmology

## 2016-10-11 ENCOUNTER — Ambulatory Visit (INDEPENDENT_AMBULATORY_CARE_PROVIDER_SITE_OTHER): Payer: Medicare Other | Admitting: Orthopaedic Surgery

## 2016-10-11 VITALS — BP 135/75 | HR 82 | Temp 97.3°F | Ht 63.0 in | Wt 132.0 lb

## 2016-10-11 DIAGNOSIS — M25511 Pain in right shoulder: Secondary | ICD-10-CM

## 2016-10-11 DIAGNOSIS — G8929 Other chronic pain: Secondary | ICD-10-CM | POA: Diagnosis not present

## 2016-10-11 NOTE — Progress Notes (Signed)
Patient Anne Davenport Daisy Blossom, female DOB:[birth year], 76 y.o. NFA:[id number]  No chief complaint on file.   HPI  Anne Davenport is a 76 y.o. female who has right shoulder pain.  She is better with this as she has been going to OT and is improved.  She is doing her exercises.  She fell Monday night, two days ago, and hurt her tail bone and left upper arm.  She has large hematoma of left upper lateral arm but NV is intact.  She is walking slowly but well.  She has no paresthesias. HPI  Body mass index is 23.38 kg/m.  ROS  Review of Systems  Respiratory: Negative for cough and shortness of breath.   Cardiovascular: Negative for chest pain.  Endocrine: Positive for cold intolerance.  Musculoskeletal: Positive for arthralgias, gait problem and joint swelling.  Allergic/Immunologic: Positive for environmental allergies.    Past Medical History:  Diagnosis Date  . Anxiety   . Back pain    mid back  . Bartholin cyst 05/20/2015  . Family history of adverse reaction to anesthesia    Daughter stopped breathing with Ativan  . 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 Hyperlipemia   . Hypertension   . IBS (irritable bowel syndrome)   . 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 Screening for STD (sexually transmitted disease) 07/23/2015    Past Surgical History:  Procedure Laterality Date  . ABDOMINAL HYSTERECTOMY    . BLADDER REPAIR    . COLONOSCOPY N/A 06/24/2015   Procedure: COLONOSCOPY;  Surgeon: Malissa Hippo, MD;  Location: [facility];  Service: Endoscopy;  Laterality: N/A;  730  . ESOPHAGOGASTRODUODENOSCOPY N/A 08/14/2014   Procedure: ESOPHAGOGASTRODUODENOSCOPY (EGD);  Surgeon: Malissa Hippo, MD;  Location: [facility];  Service: Endoscopy;  Laterality: N/A;  1:10  . TONSILLECTOMY    . WRIST SURGERY Left     Family History  Problem Relation Age of Onset  . Diabetes Mother   . Hypertension Mother   . Alcohol abuse Brother   .  Cancer Daughter     breast  . Hodgkin's lymphoma Daughter   . Diabetes Maternal Grandfather   . Heart disease Maternal Grandfather   . Other Paternal Grandfather     poor circulation    Social History Social History  Substance Use Topics  . Smoking status: Former Smoker    Packs/day: 0.50    Years: 10.00    Types: Cigarettes    Quit date: 07/09/1984  . Smokeless tobacco: Never Used  . Alcohol use 0.0 oz/week     Comment: "once in a while"    Allergies  Allergen Reactions  . Codeine Itching and Nausea And Vomiting  . Molds & Smuts Other (See Comments)    Reaction:  Itchy, watery eyes   . Sulfa Antibiotics Nausea And Vomiting and Rash  . Zocor [Simvastatin] Nausea And Vomiting and Rash    Current Outpatient Prescriptions  Medication Sig Dispense Refill  . acetaminophen (TYLENOL) 500 MG tablet Take 500 mg by mouth every 6 (six) hours as needed for mild pain, moderate pain or headache.     . Biotin 1000 MCG tablet Take 1,000 mcg by mouth at bedtime.     . Calcium Carbonate-Vitamin D (CALCIUM 600+D) 600-400 MG-UNIT tablet Take 1 tablet by mouth daily.    . cholecalciferol (VITAMIN D) 1000 units tablet Take 1,000 Units by mouth daily with lunch.    . estradiol (CLIMARA - DOSED IN MG/24  HR) 0.1 mg/24hr Place 1 patch onto the skin every Saturday.     . fluticasone (FLONASE) 50 MCG/ACT nasal spray Place 2 sprays into the nose daily as needed for rhinitis.     Marland Kitchen HYDROcodone-acetaminophen (NORCO/VICODIN) 5-325 MG tablet Take 1 tablet by mouth every 6 (six) hours as needed for moderate pain (Must last 14 days.Do not take and drive a car or use machinery.). 56 tablet 0  . levothyroxine (SYNTHROID, LEVOTHROID) 75 MCG tablet Take 75 mcg by mouth at bedtime.     Marland Kitchen LORazepam (ATIVAN) 0.5 MG tablet Take 1 tablet (0.5 mg total) by mouth 2 (two) times daily as needed for anxiety. 60 tablet 0  . oxymetazoline (AFRIN) 0.05 % nasal spray Place 1 spray into both nostrils 2 (two) times daily as  needed for congestion.    . pantoprazole (PROTONIX) 20 MG tablet Take 1 tablet (20 mg total) by mouth daily. 30 tablet 0  . pseudoephedrine (SUDAFED) 30 MG tablet Take 30 mg by mouth every 4 (four) hours as needed for congestion.    . sertraline (ZOLOFT) 50 MG tablet Take 25 mg by mouth at bedtime.      No current facility-administered medications for this visit.      Physical Exam  Blood pressure 135/75, pulse 82, temperature 97.3 F (36.3 C), height  (1.6 m), weight 132 lb (59.9 kg).  Constitutional: overall normal hygiene, normal nutrition, well developed, normal grooming, normal body habitus. Assistive device:none  Musculoskeletal: gait and station Limp none, muscle tone and strength are normal, no tremors or atrophy is present.  .  Neurological: coordination overall normal.  Deep tendon reflex/nerve stretch intact.  Sensation normal.  Cranial nerves II-XII intact.   Skin:   Normal overall no scars, lesions, ulcers or rashes. No psoriasis.  Psychiatric: Alert and oriented x 3.  Recent memory intact, remote memory unclear.  Normal mood and affect. Well groomed.  Good eye contact.  Cardiovascular: overall no swelling, no varicosities, no edema bilaterally, normal temperatures of the legs and arms, no clubbing, cyanosis and good capillary refill.  Lymphatic: palpation is normal.  Right shoulder has full motion but tender in the extremes.  The left upper arm has large hematoma laterally.  She has full motion of the left upper extremity.  NV is intact.  The patient has been educated about the nature of the problem(s) and counseled on treatment options.  The patient appeared to understand what I have discussed and is in agreement with it.  Encounter Diagnosis  Name Primary?  . Chronic right shoulder pain Yes    PLAN Call if any problems.  Precautions discussed.  Continue current medications.   Return to clinic 1 month   Continue PT and OT.  Electronically  Signed Darreld Mclean, MD 4/4/20183:12 PM

## 2016-10-25 ENCOUNTER — Ambulatory Visit (INDEPENDENT_AMBULATORY_CARE_PROVIDER_SITE_OTHER): Payer: Medicare Other | Admitting: Ophthalmology

## 2016-11-02 ENCOUNTER — Ambulatory Visit (INDEPENDENT_AMBULATORY_CARE_PROVIDER_SITE_OTHER): Payer: Medicare Other | Admitting: Ophthalmology

## 2016-11-02 DIAGNOSIS — H33302 Unspecified retinal break, left eye: Secondary | ICD-10-CM | POA: Diagnosis not present

## 2016-11-02 DIAGNOSIS — H43813 Vitreous degeneration, bilateral: Secondary | ICD-10-CM

## 2016-11-14 ENCOUNTER — Ambulatory Visit: Payer: Medicare Other | Admitting: Orthopaedic Surgery

## 2016-11-28 ENCOUNTER — Ambulatory Visit: Payer: Medicare Other | Admitting: Orthopaedic Surgery

## 2016-11-29 ENCOUNTER — Encounter: Payer: Self-pay | Admitting: Orthopaedic Surgery

## 2016-11-29 ENCOUNTER — Ambulatory Visit (INDEPENDENT_AMBULATORY_CARE_PROVIDER_SITE_OTHER): Payer: Medicare Other | Admitting: Orthopaedic Surgery

## 2016-11-29 VITALS — BP 121/69 | HR 72 | Temp 97.7°F | Ht 64.0 in | Wt 131.0 lb

## 2016-11-29 DIAGNOSIS — G8929 Other chronic pain: Secondary | ICD-10-CM | POA: Diagnosis not present

## 2016-11-29 DIAGNOSIS — M25511 Pain in right shoulder: Secondary | ICD-10-CM | POA: Diagnosis not present

## 2016-11-29 DIAGNOSIS — M545 Low back pain: Secondary | ICD-10-CM

## 2016-11-29 NOTE — Progress Notes (Signed)
Patient EA:VWUJ:Anne Davenport, female DOB:02-17-1941, 76 y.o. WJX:[id number]  Chief Complaint  Patient presents with  . Follow-up    CHRONIC RIGHT SHOULDER PAIN    HPI  Anne Davenport is a 76 y.o. female who has right shoulder pain that is much improved.  She has some lower back pain at times, activity and weather related.  She has no new trauma, no weakness, no paresthesias. HPI  Body mass index is 22.49 kg/m.  ROS  Review of Systems  Respiratory: Negative for cough and shortness of breath.   Cardiovascular: Negative for chest pain.  Endocrine: Positive for cold intolerance.  Musculoskeletal: Positive for arthralgias, gait problem and joint swelling.  Allergic/Immunologic: Positive for environmental allergies.    Past Medical History:  Diagnosis Date  . Anxiety   . Back pain    mid back  . Bartholin cyst 05/20/2015  . Family history of adverse reaction to anesthesia    Daughter stopped breathing with Ativan  . 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 Hyperlipemia   . Hypertension   . IBS (irritable bowel syndrome)   . 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 Screening for STD (sexually transmitted disease) 07/23/2015    Past Surgical History:  Procedure Laterality Date  . ABDOMINAL HYSTERECTOMY    . BLADDER REPAIR    . COLONOSCOPY N/A 06/24/2015   Procedure: COLONOSCOPY;  Surgeon: Malissa HippoNajeeb U Rehman, MD;  Location: [facility];  Service: Endoscopy;  Laterality: N/A;  730  . ESOPHAGOGASTRODUODENOSCOPY N/A 08/14/2014   Procedure: ESOPHAGOGASTRODUODENOSCOPY (EGD);  Surgeon: Malissa HippoNajeeb U Rehman, MD;  [facility]: [facility];  Service: Endoscopy;  Laterality: N/A;  1:10  . TONSILLECTOMY    . WRIST SURGERY Left     Family History  Problem Relation Age of Onset  . Diabetes Mother   . Hypertension Mother   . Alcohol abuse Brother   . Cancer Daughter        breast  . Hodgkin's lymphoma Daughter   . Diabetes Maternal Grandfather   .  Heart disease Maternal Grandfather   . Other Paternal Grandfather        poor circulation    Social History Social History  Substance Use Topics  . Smoking status: Former Smoker    Packs/day: 0.50    Years: 10.00    Types: Cigarettes    Quit date: 07/09/1984  . Smokeless tobacco: Never Used  . Alcohol use 0.0 oz/week     Comment: "once in a while"    Allergies  Allergen Reactions  . Codeine Itching and Nausea And Vomiting  . Molds & Smuts Other (See Comments)    Reaction:  Itchy, watery eyes   . Sulfa Antibiotics Nausea And Vomiting and Rash  . Zocor [Simvastatin] Nausea And Vomiting and Rash    Current Outpatient Prescriptions  Medication Sig Dispense Refill  . acetaminophen (TYLENOL) 500 MG tablet Take 500 mg by mouth every 6 (six) hours as needed for mild pain, moderate pain or headache.     . Biotin 1000 MCG tablet Take 1,000 mcg by mouth at bedtime.     . Calcium Carbonate-Vitamin D (CALCIUM 600+D) 600-400 MG-UNIT tablet Take 1 tablet by mouth daily.    . cholecalciferol (VITAMIN D) 1000 units tablet Take 1,000 Units by mouth daily with lunch.    . estradiol (CLIMARA - DOSED IN MG/24 HR) 0.1 mg/24hr Place 1 patch onto the skin every Saturday.     . fluticasone (FLONASE) 50 MCG/ACT  nasal spray Place 2 sprays into the nose daily as needed for rhinitis.     Marland Kitchen HYDROcodone-acetaminophen (NORCO/VICODIN) 5-325 MG tablet Take 1 tablet by mouth every 6 (six) hours as needed for moderate pain (Must last 14 days.Do not take and drive a car or use machinery.). 56 tablet 0  . levothyroxine (SYNTHROID, LEVOTHROID) 75 MCG tablet Take 75 mcg by mouth at bedtime.     Marland Kitchen LORazepam (ATIVAN) 0.5 MG tablet Take 1 tablet (0.5 mg total) by mouth 2 (two) times daily as needed for anxiety. 60 tablet 0  . oxymetazoline (AFRIN) 0.05 % nasal spray Place 1 spray into both nostrils 2 (two) times daily as needed for congestion.    . pantoprazole (PROTONIX) 20 MG tablet Take 1 tablet (20 mg total) by  mouth daily. 30 tablet 0  . pseudoephedrine (SUDAFED) 30 MG tablet Take 30 mg by mouth every 4 (four) hours as needed for congestion.    . sertraline (ZOLOFT) 50 MG tablet Take 25 mg by mouth at bedtime.      No current facility-administered medications for this visit.      Physical Exam  Blood pressure 121/69, pulse 72, temperature 97.7 F (36.5 C), height 5\' 4"  (1.626 m), weight 131 lb (59.4 kg).  Constitutional: overall normal hygiene, normal nutrition, well developed, normal grooming, normal body habitus. Assistive device:none  Musculoskeletal: gait and station Limp none, muscle tone and strength are normal, no tremors or atrophy is present.  .  Neurological: coordination overall normal.  Deep tendon reflex/nerve stretch intact.  Sensation normal.  Cranial nerves II-XII intact.   Skin:   Normal overall no scars, lesions, ulcers or rashes. No psoriasis.  Psychiatric: Alert and oriented x 3.  Recent memory intact, remote memory unclear.  Normal mood and affect. Well groomed.  Good eye contact.  Cardiovascular: overall no swelling, no varicosities, no edema bilaterally, normal temperatures of the legs and arms, no clubbing, cyanosis and good capillary refill.  Lymphatic: palpation is normal.  Right shoulder has full motion and no pain.  ROM of the neck is full.  NV intact.  Spine/Pelvis examination:  Inspection:  Overall, sacoiliac joint benign and hips nontender; without crepitus or defects.   Thoracic spine inspection: Alignment normal without kyphosis present   Lumbar spine inspection:  Alignment  with normal lumbar lordosis, without scoliosis apparent.   Thoracic spine palpation:  without tenderness of spinal processes   Lumbar spine palpation: with tenderness of lumbar area; without tightness of lumbar muscles    Range of Motion:   Lumbar flexion, forward flexion is 45 without pain or tenderness    Lumbar extension is 10 without pain or tenderness   Left lateral bend  is Normal  without pain or tenderness   Right lateral bend is Normal without pain or tenderness   Straight leg raising is Normal   Strength & tone: Normal   Stability overall normal stability     The patient has been educated about the nature of the problem(s) and counseled on treatment options.  The patient appeared to understand what I have discussed and is in agreement with it.  Encounter Diagnoses  Name Primary?  . Chronic right shoulder pain Yes  . Chronic midline low back pain without sciatica     PLAN Call if any problems.  Precautions discussed.  Continue current medications.   Return to clinic 6 weeks   Electronically Signed Darreld Mclean, MD 5/23/20183:57 PM

## 2017-01-02 ENCOUNTER — Ambulatory Visit (INDEPENDENT_AMBULATORY_CARE_PROVIDER_SITE_OTHER): Payer: Medicare Other | Admitting: Orthopaedic Surgery

## 2017-01-02 ENCOUNTER — Encounter: Payer: Self-pay | Admitting: Orthopaedic Surgery

## 2017-01-02 VITALS — BP 122/67 | HR 67 | Temp 97.1°F | Ht 64.0 in | Wt 130.0 lb

## 2017-01-02 DIAGNOSIS — M7061 Trochanteric bursitis, right hip: Secondary | ICD-10-CM

## 2017-01-02 MED ORDER — HYDROCODONE-ACETAMINOPHEN 5-325 MG PO TABS: 1.0000 | ORAL_TABLET | Freq: Four times a day (QID) | ORAL | 0 refills | Status: DC | PRN

## 2017-01-02 NOTE — Progress Notes (Signed)
PROCEDURE NOTE:  The patient request injection, verbal consent was obtained.  The right trochanteric area of the hip was prepped appropriately after time out was performed.   Sterile technique was observed and injection of 1 cc of Depo-Medrol 40 mg with several cc's of plain xylocaine. Anesthesia was provided by ethyl chloride and a 20-gauge needle was used to inject the hip area. The injection was tolerated well.  A band aid dressing was applied.  The patient was advised to apply ice later today and tomorrow to the injection sight as needed.  Encounter Diagnosis  Name Primary?  . Trochanteric bursitis, right hip Yes   Return PRN  Call if any problem.  Precautions discussed.   I have reviewed the [place] Controlled Substance Reporting System web site prior to prescribing narcotic medicine for this patient.  Electronically Signed Darreld McleanWayne Jarielys Girardot, MD 6/26/20189:43 AM

## 2017-01-16 ENCOUNTER — Ambulatory Visit: Payer: Medicare Other | Admitting: Orthopaedic Surgery

## 2017-01-23 ENCOUNTER — Ambulatory Visit (INDEPENDENT_AMBULATORY_CARE_PROVIDER_SITE_OTHER): Payer: Self-pay | Admitting: Orthopaedic Surgery

## 2017-01-23 ENCOUNTER — Encounter: Payer: Self-pay | Admitting: Orthopaedic Surgery

## 2017-01-23 VITALS — BP 139/73 | HR 72 | Temp 97.7°F | Ht 64.0 in | Wt 131.0 lb

## 2017-01-23 DIAGNOSIS — M25512 Pain in left shoulder: Secondary | ICD-10-CM

## 2017-01-23 DIAGNOSIS — G8929 Other chronic pain: Secondary | ICD-10-CM

## 2017-01-23 NOTE — Progress Notes (Signed)
PROCEDURE NOTE:  The patient request injection, verbal consent was obtained.  The left shoulder was prepped appropriately after time out was performed.   Sterile technique was observed and injection of 1 cc of Depo-Medrol 40 mg with several cc's of plain xylocaine. Anesthesia was provided by ethyl chloride and a 20-gauge needle was used to inject the shoulder area. A posterior approach was used.  The injection was tolerated well.  A band aid dressing was applied.  The patient was advised to apply ice later today and tomorrow to the injection sight as needed.  Encounter Diagnosis  Name Primary?  . Chronic left shoulder pain Yes   I will see has as needed.  Call if any problem.  Precautions discussed.   Electronically Signed Darreld McleanWayne Memori Sammon, MD 7/17/201810:59 AM

## 2017-03-06 ENCOUNTER — Other Ambulatory Visit (HOSPITAL_COMMUNITY): Payer: Self-pay | Admitting: Family Medicine

## 2017-03-06 ENCOUNTER — Ambulatory Visit (HOSPITAL_COMMUNITY)
Admission: RE | Admit: 2017-03-06 | Discharge: 2017-03-06 | Disposition: A | Discharge status: Home / Self Care | Payer: Medicare Other | Source: Ambulatory Visit | Attending: Family Medicine | Admitting: Family Medicine

## 2017-03-06 DIAGNOSIS — R1013 Epigastric pain: Secondary | ICD-10-CM | POA: Diagnosis not present

## 2017-03-06 DIAGNOSIS — R531 Weakness: Secondary | ICD-10-CM | POA: Insufficient documentation

## 2017-03-21 ENCOUNTER — Other Ambulatory Visit (HOSPITAL_COMMUNITY): Payer: Self-pay | Admitting: Family Medicine

## 2017-03-21 DIAGNOSIS — Z1231 Encounter for screening mammogram for malignant neoplasm of breast: Secondary | ICD-10-CM

## 2017-04-25 ENCOUNTER — Encounter: Payer: Self-pay | Admitting: Orthopaedic Surgery

## 2017-04-25 ENCOUNTER — Ambulatory Visit (INDEPENDENT_AMBULATORY_CARE_PROVIDER_SITE_OTHER): Payer: Medicare Other | Admitting: Orthopaedic Surgery

## 2017-04-25 VITALS — BP 133/75 | HR 84 | Temp 97.3°F | Ht 64.0 in | Wt 129.0 lb

## 2017-04-25 DIAGNOSIS — M25512 Pain in left shoulder: Secondary | ICD-10-CM | POA: Diagnosis not present

## 2017-04-25 DIAGNOSIS — G8929 Other chronic pain: Secondary | ICD-10-CM | POA: Diagnosis not present

## 2017-04-25 NOTE — Progress Notes (Signed)
PROCEDURE NOTE:  The patient request injection, verbal consent was obtained.  The left shoulder was prepped appropriately after time out was performed.   Sterile technique was observed and injection of 1 cc of Depo-Medrol 40 mg with several cc's of plain xylocaine. Anesthesia was provided by ethyl chloride and a 20-gauge needle was used to inject the shoulder area. A posterior approach was used.  The injection was tolerated well.  A band aid dressing was applied.  The patient was advised to apply ice later today and tomorrow to the injection sight as needed.  Return in two weeks.  Call if any problem.  Precautions discussed.    Electronically Signed Darreld McleanWayne Nishawn Rotan, MD 10/17/20183:34 PM

## 2017-05-03 ENCOUNTER — Encounter (HOSPITAL_COMMUNITY): Payer: Self-pay

## 2017-05-03 ENCOUNTER — Ambulatory Visit (HOSPITAL_COMMUNITY)
Admission: RE | Admit: 2017-05-03 | Discharge: 2017-05-03 | Disposition: A | Discharge status: Home / Self Care | Payer: Medicare Other | Source: Ambulatory Visit | Attending: Family Medicine | Admitting: Family Medicine

## 2017-05-03 DIAGNOSIS — Z1231 Encounter for screening mammogram for malignant neoplasm of breast: Secondary | ICD-10-CM | POA: Diagnosis not present

## 2017-05-08 ENCOUNTER — Ambulatory Visit: Payer: Medicare Other | Admitting: Orthopaedic Surgery

## 2017-05-22 ENCOUNTER — Ambulatory Visit: Payer: Medicare Other | Admitting: Orthopaedic Surgery

## 2017-08-02 ENCOUNTER — Ambulatory Visit: Payer: Medicare Other | Admitting: Orthopaedic Surgery

## 2017-08-08 ENCOUNTER — Ambulatory Visit (INDEPENDENT_AMBULATORY_CARE_PROVIDER_SITE_OTHER): Payer: Medicare Other

## 2017-08-08 ENCOUNTER — Encounter: Payer: Self-pay | Admitting: Orthopaedic Surgery

## 2017-08-08 ENCOUNTER — Ambulatory Visit (INDEPENDENT_AMBULATORY_CARE_PROVIDER_SITE_OTHER): Payer: Medicare Other | Admitting: Orthopaedic Surgery

## 2017-08-08 VITALS — BP 131/81 | HR 83 | Ht 64.0 in | Wt 131.0 lb

## 2017-08-08 DIAGNOSIS — M545 Low back pain, unspecified: Secondary | ICD-10-CM

## 2017-08-08 DIAGNOSIS — G8929 Other chronic pain: Secondary | ICD-10-CM | POA: Diagnosis not present

## 2017-08-08 MED ORDER — HYDROCODONE-ACETAMINOPHEN 5-325 MG PO TABS: ORAL_TABLET | ORAL | 0 refills | Status: DC

## 2017-08-08 MED ORDER — HYDROCODONE-ACETAMINOPHEN 5-325 MG PO TABS: 1.0000 | ORAL_TABLET | Freq: Four times a day (QID) | ORAL | 0 refills | Status: DC | PRN

## 2017-08-08 NOTE — Progress Notes (Signed)
Patient WU:JWJX:Anne Davenport, female DOB:12-23-1940, 77 y.o. [id number]  Chief Complaint  Patient presents with  . Follow-up    back    HPI  [name] is a 77 y.o. female who is having increased pain in the lower back and lower thoracic area.  She says the weather has made it worse.  She was trying to get to see Dr. Danielle DessElsner for another epidural when she found out last week that her insurance will not cover his office. She has lower back pain with no paresthesias.  She is uncomfortable most of the time. She has no trauma,no weakness.  She does have a scoliosis. She is taking Tylenol with little help. HPI  Body mass index is 22.49 kg/m.  ROS  Review of Systems  Respiratory: Negative for cough and shortness of breath.   Cardiovascular: Negative for chest pain.  Endocrine: Positive for cold intolerance.  Musculoskeletal: Positive for arthralgias, gait problem and joint swelling.  Allergic/Immunologic: Positive for environmental allergies.  All other systems reviewed and are negative.   Past Medical History:  Diagnosis Date  . Anxiety   . Back pain    mid back  . Bartholin cyst 05/20/2015  . Family history of adverse reaction to anesthesia    Daughter stopped breathing with Ativan  . 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 Hyperlipemia   . Hypertension   . IBS (irritable bowel syndrome)   . 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 Screening for STD (sexually transmitted disease) 07/23/2015    Past Surgical History:  Procedure Laterality Date  . ABDOMINAL HYSTERECTOMY    . BLADDER REPAIR    . COLONOSCOPY N/A 06/24/2015   Procedure: COLONOSCOPY;  Surgeon: Malissa HippoNajeeb U Rehman, MD;  Location: [facility];  Service: Endoscopy;  Laterality: N/A;  730  . ESOPHAGOGASTRODUODENOSCOPY N/A 08/14/2014   Procedure: ESOPHAGOGASTRODUODENOSCOPY (EGD);  Surgeon: Malissa HippoNajeeb U Rehman, MD;  Location: [facility];  Service: Endoscopy;  Laterality: N/A;   1:10  . TONSILLECTOMY    . WRIST SURGERY Left     Family History  Problem Relation Age of Onset  . Diabetes Mother   . Hypertension Mother   . Alcohol abuse Brother   . Cancer Daughter        breast  . Hodgkin's lymphoma Daughter   . Diabetes Maternal Grandfather   . Heart disease Maternal Grandfather   . Other Paternal Grandfather        poor circulation    Social History Social History   Tobacco Use  . Smoking status: Former Smoker    Packs/day: 0.50    Years: 10.00    Pack years: 5.00    Types: Cigarettes    Last attempt to quit: 07/09/1984    Years since quitting: 33.1  . Smokeless tobacco: Never Used  Substance Use Topics  . Alcohol use: Yes    Alcohol/week: 0.0 oz    Comment: "once in a while"  . Drug use: No    Allergies  Allergen Reactions  . Codeine Itching and Nausea And Vomiting  . Molds & Smuts Other (See Comments)    Reaction:  Itchy, watery eyes   . Sulfa Antibiotics Nausea And Vomiting and Rash  . Zocor [Simvastatin] Nausea And Vomiting and Rash    Current Outpatient Medications  Medication Sig Dispense Refill  . acetaminophen (TYLENOL) 500 MG tablet Take 500 mg by mouth every 6 (six) hours as needed for mild pain, moderate pain or headache.     .Marland Kitchen  Biotin 1000 MCG tablet Take 1,000 mcg by mouth at bedtime.     . Calcium Carbonate-Vitamin D (CALCIUM 600+D) 600-400 MG-UNIT tablet Take 1 tablet by mouth daily.    . cholecalciferol (VITAMIN D) 1000 units tablet Take 1,000 Units by mouth daily with lunch.    . estradiol (CLIMARA - DOSED IN MG/24 HR) 0.1 mg/24hr Place 1 patch onto the skin every Saturday.     . fluticasone (FLONASE) 50 MCG/ACT nasal spray Place 2 sprays into the nose daily as needed for rhinitis.     Marland Kitchen HYDROcodone-acetaminophen (NORCO/VICODIN) 5-325 MG tablet Take 1 tablet by mouth every 6 (six) hours as needed for moderate pain (Must last 14 days.Do not take and drive a car or use machinery.). 56 tablet 0  . levothyroxine (SYNTHROID,  LEVOTHROID) 75 MCG tablet Take 75 mcg by mouth at bedtime.     Marland Kitchen LORazepam (ATIVAN) 0.5 MG tablet Take 1 tablet (0.5 mg total) by mouth 2 (two) times daily as needed for anxiety. 60 tablet 0  . oxymetazoline (AFRIN) 0.05 % nasal spray Place 1 spray into both nostrils 2 (two) times daily as needed for congestion.    . pantoprazole (PROTONIX) 20 MG tablet Take 1 tablet (20 mg total) by mouth daily. 30 tablet 0  . pseudoephedrine (SUDAFED) 30 MG tablet Take 30 mg by mouth every 4 (four) hours as needed for congestion.    . sertraline (ZOLOFT) 50 MG tablet Take 25 mg by mouth at bedtime.      No current facility-administered medications for this visit.      Physical Exam  Blood pressure 131/81, pulse 83, height 5\' 4"  (1.626 m), weight 131 lb (59.4 kg).  Constitutional: overall normal hygiene, normal nutrition, well developed, normal grooming, normal body habitus. Assistive device:none  Musculoskeletal: gait and station Limp none, muscle tone and strength are normal, no tremors or atrophy is present.  .  Neurological: coordination overall normal.  Deep tendon reflex/nerve stretch intact.  Sensation normal.  Cranial nerves II-XII intact.   Skin:   Normal overall no scars, lesions, ulcers or rashes. No psoriasis.  Psychiatric: Alert and oriented x 3.  Recent memory intact, remote memory unclear.  Normal mood and affect. Well groomed.  Good eye contact.  Cardiovascular: overall no swelling, no varicosities, no edema bilaterally, normal temperatures of the legs and arms, no clubbing, cyanosis and good capillary refill.  Lymphatic: palpation is normal.  Spine/Pelvis examination:  Inspection:  Overall, sacoiliac joint benign and hips nontender; without crepitus or defects.   Thoracic spine inspection: Alignment abnormal with scoliosis more lumbar without kyphosis present   Lumbar spine inspection:  Alignment  with normal lumbar lordosis, with scoliosis apparent with apex at around  L2-L3.   Thoracic spine palpation:  without tenderness of spinal processes   Lumbar spine palpation: without tenderness of lumbar area; without tightness of lumbar muscles    Range of Motion:   Lumbar flexion, forward flexion is 35 with some pain.  Lumbar extension is full without pain or tenderness   Left lateral bend is normal without pain or tenderness   Right lateral bend is normal without pain or tenderness   Straight leg raising is normal  Strength & tone: normal   Stability overall normal stability All other systems reviewed and are negative   The patient has been educated about the nature of the problem(s) and counseled on treatment options.  The patient appeared to understand what I have discussed and is in agreement with it.  Encounter Diagnosis  Name Primary?  . Chronic midline low back pain without sciatica Yes    PLAN Call if any problems.  Precautions discussed.  Continue current medications.   Return to clinic after MRI of the lumbar spine.   I have reviewed the [place] Controlled Substance Reporting System web site prior to prescribing narcotic medicine for this patient.    Consider epidurals.  Electronically Signed Darreld Mclean, MD 1/30/20191:28 PM

## 2017-08-13 ENCOUNTER — Ambulatory Visit (HOSPITAL_COMMUNITY)
Admission: RE | Admit: 2017-08-13 | Discharge: 2017-08-13 | Disposition: A | Discharge status: Home / Self Care | Payer: Medicare Other | Source: Ambulatory Visit | Attending: Orthopaedic Surgery | Admitting: Orthopaedic Surgery

## 2017-08-13 DIAGNOSIS — M545 Low back pain, unspecified: Secondary | ICD-10-CM

## 2017-08-13 DIAGNOSIS — M778 Other enthesopathies, not elsewhere classified: Secondary | ICD-10-CM | POA: Insufficient documentation

## 2017-08-13 DIAGNOSIS — G8929 Other chronic pain: Secondary | ICD-10-CM | POA: Diagnosis not present

## 2017-08-13 DIAGNOSIS — M419 Scoliosis, unspecified: Secondary | ICD-10-CM | POA: Diagnosis not present

## 2017-08-15 ENCOUNTER — Ambulatory Visit (INDEPENDENT_AMBULATORY_CARE_PROVIDER_SITE_OTHER): Payer: Medicare Other | Admitting: Orthopaedic Surgery

## 2017-08-15 ENCOUNTER — Encounter: Payer: Self-pay | Admitting: Orthopaedic Surgery

## 2017-08-15 VITALS — BP 134/81 | HR 73 | Ht 64.0 in | Wt 132.0 lb

## 2017-08-15 DIAGNOSIS — G8929 Other chronic pain: Secondary | ICD-10-CM | POA: Diagnosis not present

## 2017-08-15 DIAGNOSIS — M545 Low back pain: Secondary | ICD-10-CM

## 2017-08-15 DIAGNOSIS — M25512 Pain in left shoulder: Secondary | ICD-10-CM | POA: Diagnosis not present

## 2017-08-15 NOTE — Progress Notes (Signed)
Patient Anne Davenport, female DOB:01/07/1941, 77 y.o. [id number]  Chief Complaint  Patient presents with  . Results    MRI LUMBAR SPINE    HPI  Anne Davenport is a 77 y.o. female who has lower back pain.  She was to have an epidural with Dr. Danielle Dess but her insurance does not cover him.  She had a new MRI which showed: IMPRESSION: No new abnormality since the prior examination.  Negative for central canal stenosis. Disc and endplate spur result in encroachment on the exiting right L5 root at L5-S1. The foramina are otherwise widely patent.  Scoliosis.  I will have her seen at [facility] for epidurals.  She has new problem today, left shoulder pain.  She has pain with overhead use, rolling over on it at night.  She has no swelling, no numbness.  She has no trauma.  She has no neck pain.  HPI  Body mass index is 22.66 kg/m.  ROS  Review of Systems  Respiratory: Negative for cough and shortness of breath.   Cardiovascular: Negative for chest pain.  Endocrine: Positive for cold intolerance.  Musculoskeletal: Positive for arthralgias, gait problem and joint swelling.  Allergic/Immunologic: Positive for environmental allergies.  All other systems reviewed and are negative.   Past Medical History:  Diagnosis Date  . Anxiety   . Back pain    mid back  . Bartholin cyst 05/20/2015  . Family history of adverse reaction to anesthesia    Daughter stopped breathing with Ativan  . 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 Hyperlipemia   . Hypertension   . IBS (irritable bowel syndrome)   . 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 Screening for STD (sexually transmitted disease) 07/23/2015    Past Surgical History:  Procedure Laterality Date  . ABDOMINAL HYSTERECTOMY    . BLADDER REPAIR    . COLONOSCOPY N/A 06/24/2015   Procedure: COLONOSCOPY;  Surgeon: Malissa Hippo, MD;  Location: [facility];  Service: Endoscopy;   Laterality: N/A;  730  . ESOPHAGOGASTRODUODENOSCOPY N/A 08/14/2014   Procedure: ESOPHAGOGASTRODUODENOSCOPY (EGD);  Surgeon: Malissa Hippo, MD;  Location: [facility];  Service: Endoscopy;  Laterality: N/A;  1:10  . TONSILLECTOMY    . WRIST SURGERY Left     Family History  Problem Relation Age of Onset  . Diabetes Mother   . Hypertension Mother   . Alcohol abuse Brother   . Cancer Daughter        breast  . Hodgkin's lymphoma Daughter   . Diabetes Maternal Grandfather   . Heart disease Maternal Grandfather   . Other Paternal Grandfather        poor circulation    Social History Social History   Tobacco Use  . Smoking status: Former Smoker    Packs/day: 0.50    Years: 10.00    Pack years: 5.00    Types: Cigarettes    Last attempt to quit: 07/09/1984    Years since quitting: 33.1  . Smokeless tobacco: Never Used  Substance Use Topics  . Alcohol use: Yes    Alcohol/week: 0.0 oz    Comment: "once in a while"  . Drug use: No    Allergies  Allergen Reactions  . Codeine Itching and Nausea And Vomiting  . Molds & Smuts Other (See Comments)    Reaction:  Itchy, watery eyes   . Sulfa Antibiotics Nausea And Vomiting and Rash  . Zocor [Simvastatin] Nausea And Vomiting  and Rash    Current Outpatient Medications  Medication Sig Dispense Refill  . acetaminophen (TYLENOL) 500 MG tablet Take 500 mg by mouth every 6 (six) hours as needed for mild pain, moderate pain or headache.     . Biotin 1000 MCG tablet Take 1,000 mcg by mouth at bedtime.     . Calcium Carbonate-Vitamin D (CALCIUM 600+D) 600-400 MG-UNIT tablet Take 1 tablet by mouth daily.    . cholecalciferol (VITAMIN D) 1000 units tablet Take 1,000 Units by mouth daily with lunch.    . estradiol (CLIMARA - DOSED IN MG/24 HR) 0.1 mg/24hr Place 1 patch onto the skin every Saturday.     . fluticasone (FLONASE) 50 MCG/ACT nasal spray Place 2 sprays into the nose daily as needed for rhinitis.     Marland Kitchen. HYDROcodone-acetaminophen  (NORCO/VICODIN) 5-325 MG tablet One tablet by mouth every six hours as needed for pain.  Seven day limit 28 tablet 0  . levothyroxine (SYNTHROID, LEVOTHROID) 75 MCG tablet Take 75 mcg by mouth at bedtime.     Marland Kitchen. LORazepam (ATIVAN) 0.5 MG tablet Take 1 tablet (0.5 mg total) by mouth 2 (two) times daily as needed for anxiety. 60 tablet 0  . oxymetazoline (AFRIN) 0.05 % nasal spray Place 1 spray into both nostrils 2 (two) times daily as needed for congestion.    . pantoprazole (PROTONIX) 20 MG tablet Take 1 tablet (20 mg total) by mouth daily. 30 tablet 0  . pseudoephedrine (SUDAFED) 30 MG tablet Take 30 mg by mouth every 4 (four) hours as needed for congestion.    . sertraline (ZOLOFT) 50 MG tablet Take 25 mg by mouth at bedtime.      No current facility-administered medications for this visit.      Physical Exam  Blood pressure 134/81, pulse 73, height 5\' 4"  (1.626 m), weight 132 lb (59.9 kg).  Constitutional: overall normal hygiene, normal nutrition, well developed, normal grooming, normal body habitus. Assistive device:none  Musculoskeletal: gait and station Limp none, muscle tone and strength are normal, no tremors or atrophy is present.  .  Neurological: coordination overall normal.  Deep tendon reflex/nerve stretch intact.  Sensation normal.  Cranial nerves II-XII intact.   Skin:   Normal overall no scars, lesions, ulcers or rashes. No psoriasis.  Psychiatric: Alert and oriented x 3.  Recent memory intact, remote memory unclear.  Normal mood and affect. Well groomed.  Good eye contact.  Cardiovascular: overall no swelling, no varicosities, no edema bilaterally, normal temperatures of the legs and arms, no clubbing, cyanosis and good capillary refill.  Lymphatic: palpation is normal.  Spine/Pelvis examination:  Inspection:  Overall, sacoiliac joint benign and hips nontender; without crepitus or defects.   Thoracic spine inspection: Alignment normal without kyphosis  present   Lumbar spine inspection:  Alignment  with normal lumbar lordosis, without scoliosis apparent.   Thoracic spine palpation:  without tenderness of spinal processes   Lumbar spine palpation: without tenderness of lumbar area; without tightness of lumbar muscles    Range of Motion:   Lumbar flexion, forward flexion is normal without pain or tenderness    Lumbar extension is full without pain or tenderness   Left lateral bend is normal without pain or tenderness   Right lateral bend is normal without pain or tenderness   Straight leg raising is normal  Strength & tone: normal   Stability overall normal stability   Examination of left Upper Extremity is done.  Inspection:   Overall:  Elbow  non-tender without crepitus or defects, forearm non-tender without crepitus or defects, wrist non-tender without crepitus or defects, hand non-tender.    Shoulder: with glenohumeral joint tenderness, without effusion.   Upper arm: without swelling and tenderness   Range of motion:   Overall:  Full range of motion of the elbow, full range of motion of wrist and full range of motion in fingers.   Shoulder:  left  165 degrees forward flexion; 150 degrees abduction; 35 degrees internal rotation, 35 degrees external rotation, 15 degrees extension, 40 degrees adduction.   Stability:   Overall:  Shoulder, elbow and wrist stable   Strength and Tone:   Overall full shoulder muscles strength, full upper arm strength and normal upper arm bulk and tone.  All other systems reviewed and are negative   The patient has been educated about the nature of the problem(s) and counseled on treatment options.  The patient appeared to understand what I have discussed and is in agreement with it.  Encounter Diagnoses  Name Primary?  . Chronic midline low back pain without sciatica Yes  . Chronic left shoulder pain    PROCEDURE NOTE:  The patient request injection, verbal consent was obtained.  The left  shoulder was prepped appropriately after time out was performed.   Sterile technique was observed and injection of 1 cc of Depo-Medrol 40 mg with several cc's of plain xylocaine. Anesthesia was provided by ethyl chloride and a 20-gauge needle was used to inject the shoulder area. A posterior approach was used.  The injection was tolerated well.  A band aid dressing was applied.  The patient was advised to apply ice later today and tomorrow to the injection sight as needed.   PLAN Call if any problems.  Precautions discussed.  Continue current medications.   Return to clinic PRN.  See [facility] for her back epidural.   Electronically Signed Darreld Mclean, MD 2/6/201910:21 AM

## 2017-08-28 ENCOUNTER — Ambulatory Visit (INDEPENDENT_AMBULATORY_CARE_PROVIDER_SITE_OTHER): Payer: Medicare Other

## 2017-08-28 ENCOUNTER — Encounter (INDEPENDENT_AMBULATORY_CARE_PROVIDER_SITE_OTHER): Payer: Self-pay | Admitting: Physical Medicine and Rehabilitation

## 2017-08-28 ENCOUNTER — Ambulatory Visit (INDEPENDENT_AMBULATORY_CARE_PROVIDER_SITE_OTHER): Payer: Medicare Other | Admitting: Physical Medicine and Rehabilitation

## 2017-08-28 VITALS — BP 140/81 | HR 63 | Temp 97.8°F

## 2017-08-28 DIAGNOSIS — M5416 Radiculopathy, lumbar region: Secondary | ICD-10-CM | POA: Diagnosis not present

## 2017-08-28 MED ORDER — METHYLPREDNISOLONE ACETATE 80 MG/ML IJ SUSP
80.0000 mg | Freq: Once | INTRAMUSCULAR | Status: AC
  Administered 2017-08-28: 80 mg

## 2017-08-28 NOTE — Progress Notes (Deleted)
Pt states an aching pain in the middle of her back. Pt states pain began about 2 years ago. Pt states standing, laying, and sitting in the same spot for awhile makes pain worse, aleve makes pain better. +Driver, -BT, -Dye Allergies. T12-L1

## 2017-08-28 NOTE — Progress Notes (Signed)
Anne Davenport - 77 y.o. female MRN [id number]  Date of birth: [birth year]  Office Visit Note: Visit Date: 08/28/2017 PCP: Gareth Morgan, MD Referred by: Gareth Morgan, MD  Subjective: Chief Complaint  Patient presents with  . Middle Back - Pain   HPI: Anne Davenport is a 77 year old female who comes in today at the request of Dr. Hilda Lias in [place].  Her history is such that she has had mostly aching pain in the middle of the back and some referral to the right hip at times.  She reports this began about 2 years ago.  She does not endorse any specific injury.  She has been under the care of Dr. Danielle Dess at [facility] and has been obtaining epidural injections at various times and we do have his notes to look at.  She reports worsening pain with standing lying and sitting in the same spot for a while.  She does use Aleve which helps a little bit.  Interestingly Dr. Danielle Dess is a great neurosurgeon but the way they do injections is somewhat challenging compared to what we consider the standard of care now.  He was completing what he referred to and with the patient referred to as a double injection which would be an intralaminar injection in the lower thoracic area as well as the lumbar area.  She does get pain in both places and there is imaging findings of small protrusion in the thoracic spine as well as small protrusion in the lumbar spine.  Interestingly most of her pain in the lower back when she gets it is more consistent with facet mediated pain and she has multilevel facet arthropathy with a trace listhesis of L3 on 4 and L4 on 5.  Nonetheless today we are going to complete epidural injection at T12-L1 because of the location of her pain and the fact that it is helped before.  In the future would consider facet joint block and possible radiofrequency ablation.    ROS Otherwise per HPI.  Assessment & Plan: Visit Diagnoses:  1. Lumbar radiculopathy       Plan: Findings:  T12-L1 interlaminar epidural steroid injection.    Meds & Orders:  Meds ordered this encounter  Medications  . methylPREDNISolone acetate (DEPO-MEDROL) injection 80 mg    Orders Placed This Encounter  Procedures  . XR C-ARM NO REPORT  . Epidural Steroid injection    Follow-up: Return if symptoms worsen or fail to improve.   Procedures: No procedures performed  Thoracic Epidural Steroid Injection - Interlaminar Approach with Fluoroscopic Guidance  Patient: Anne Davenport      Date of Birth: [birth year] MRN: [id number] PCP: Gareth Morgan, MD      Visit Date: 08/28/2017   Universal Protocol:     Consent Given By: the patient  Position: PRONE  Additional Comments: Vital signs were monitored before and after the procedure. Patient was prepped and draped in the usual sterile fashion. The correct patient, procedure, and site was verified.   Injection Procedure Details:  Procedure Site One Meds Administered:  Meds ordered this encounter  Medications  . methylPREDNISolone acetate (DEPO-MEDROL) injection 80 mg     Laterality: Right  Location/Site:  T12-L1  Needle size: 20 G  Needle type: Tuohy  Needle Placement: Paramedian epidural  Findings:   -Comments: Excellent flow of contrast into the epidural space.  Procedure Details: Using a paramedian approach from the side mentioned above, the region overlying the inferior lamina was localized under  fluoroscopic visualization and the soft tissues overlying this structure were infiltrated with 4 ml. of 1% Lidocaine without Epinephrine. The Tuohy needle was inserted into the epidural space using a paramedian approach.   The epidural space was localized using loss of resistance along with lateral and bi-planar fluoroscopic views.  After negative aspirate for air, blood, and CSF, a 2 ml. volume of Isovue-250 was injected into the epidural space and the flow of contrast was observed. Radiographs were  obtained for documentation purposes.    The injectate was administered into the level noted above.   Additional Comments:  The patient tolerated the procedure well Dressing: Band-Aid    Post-procedure details: Patient was observed during the procedure. Post-procedure instructions were reviewed.  Patient left the clinic in stable condition.   Clinical History: MRI LUMBAR SPINE WITHOUT CONTRAST  TECHNIQUE: Multiplanar, multisequence MR imaging of the lumbar spine was performed. No intravenous contrast was administered.  COMPARISON:  Plain films lumbar spine 08/08/2017. MRI lumbar spine 04/27/2016.  FINDINGS: Segmentation:  Standard.  Alignment: Convex right scoliosis with the apex at L2-3. Trace retrolisthesis L1 on L2 is identified. There is also trace anterolisthesis L3 on L4 and 0.3 cm anterolisthesis L4 on L5.  Vertebrae:  No fracture or worrisome lesion.  Conus medullaris and cauda equina: Conus extends to the T12-L1 level. Conus and cauda equina appear normal.  Paraspinal and other soft tissues: Negative.  Disc levels:  T8-9 to T11-12 are imaged in the sagittal plane only and negative.  T12-L1: Tiny central protrusion without stenosis.  L1-2: Very shallow disc bulge without stenosis.  L2-3: Minimal disc bulge to the left without stenosis.  L3-4: Moderate bilateral facet degenerative disease is unchanged. The disc is uncovered with a minimal bulge but the central canal and foramina open.  L4-5: Moderate bilateral facet degenerative disease is worse on the left. The disc is uncovered with a shallow bulge. The central canal and foramina are open.  L5-S1: Loss of disc space height and a shallow disc bulge. The central spinal canal and left foramen are widely patent. Disc and endplate spur in the right foramen result in some encroachment on the exiting right L5 root, unchanged.  IMPRESSION: No new abnormality since the prior  examination.  Negative for central canal stenosis. Disc and endplate spur result in encroachment on the exiting right L5 root at L5-S1. The foramina are otherwise widely patent.  Scoliosis.   Electronically Signed   By: Drusilla Kanner M.D.   On: 08/13/2017 08:57  She reports that she quit smoking about 33 years ago. Her smoking use included cigarettes. She has a 5.00 pack-year smoking history. she has never used smokeless tobacco. No results for input(s): HGBA1C, LABURIC in the last 8760 hours.  Objective:  VS:  HT:    WT:   BMI:     BP:140/81  HR:63bpm  TEMP:97.8 F (36.6 C)(Oral)  RESP:100 % Physical Exam  Musculoskeletal:  Patient ambulates without aid with a slightly forward flexed spine.  She does have concordant back pain with extension.  She also has some myofascial trigger points in the thoracic spine paraspinally.  She has no pain with hip rotation and good distal strength.    Ortho Exam Imaging: No results found.  Past Medical/Family/Surgical/Social History: Medications & Allergies reviewed per EMR Patient Active Problem List   Diagnosis Date Noted  . Right knee pain 08/18/2015  . Right shoulder pain 08/18/2015  . 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 Screening for STD (sexually transmitted  disease) 07/23/2015  . Bartholin cyst 05/20/2015  . 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 Iatrogenic hypothyroidism 04/09/2015  . High cholesterol 09/07/2014   Past Medical History:  Diagnosis Date  . Anxiety   . Back pain    mid back  . Bartholin cyst 05/20/2015  . Family history of adverse reaction to anesthesia    Daughter stopped breathing with Ativan  . 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 Hyperlipemia   . Hypertension   . IBS (irritable bowel syndrome)   . 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 Screening for STD (sexually transmitted disease) 07/23/2015    Family History  Problem Relation Age of Onset  . Diabetes Mother   . Hypertension Mother   . Alcohol abuse Brother   . Cancer Daughter        breast  . Hodgkin's lymphoma Daughter   . Diabetes Maternal Grandfather   . Heart disease Maternal Grandfather   . Other Paternal Grandfather        poor circulation   Past Surgical History:  Procedure Laterality Date  . ABDOMINAL HYSTERECTOMY    . BLADDER REPAIR    . COLONOSCOPY N/A 06/24/2015   Procedure: COLONOSCOPY;  Surgeon: Malissa HippoNajeeb U Rehman, MD;  Location: [facility];  Service: Endoscopy;  Laterality: N/A;  730  . ESOPHAGOGASTRODUODENOSCOPY N/A 08/14/2014   Procedure: ESOPHAGOGASTRODUODENOSCOPY (EGD);  Surgeon: Malissa HippoNajeeb U Rehman, MD;  Location: [facility];  Service: Endoscopy;  Laterality: N/A;  1:10  . TONSILLECTOMY    . WRIST SURGERY Left    Social History   Occupational History  . Not on file  Tobacco Use  . Smoking status: Former Smoker    Packs/day: 0.50    Years: 10.00    Pack years: 5.00    Types: Cigarettes    Last attempt to quit: 07/09/1984    Years since quitting: 33.1  . Smokeless tobacco: Never Used  Substance and Sexual Activity  . Alcohol use: Yes    Alcohol/week: 0.0 oz    Comment: "once in a while"  . Drug use: No  . Sexual activity: Not Currently    Birth control/protection: Surgical    Comment: hyst

## 2017-08-28 NOTE — Patient Instructions (Signed)

## 2017-08-30 ENCOUNTER — Telehealth: Payer: Self-pay | Admitting: Orthopaedic Surgery

## 2017-08-30 MED ORDER — CIPROFLOXACIN HCL 500 MG PO TABS: 500.0000 mg | ORAL_TABLET | Freq: Two times a day (BID) | ORAL | 1 refills | Status: DC

## 2017-08-30 NOTE — Telephone Encounter (Signed)
Patient requests a prescription for Ciprofloxacin HCL 500 mg [facility]  Thanks

## 2017-09-03 ENCOUNTER — Ambulatory Visit: Payer: Self-pay | Admitting: Adult Health

## 2017-09-03 ENCOUNTER — Telehealth: Payer: Self-pay | Admitting: Orthopaedic Surgery

## 2017-09-03 NOTE — Telephone Encounter (Signed)
[facility] called - voice message left Friday, 08/31/17, in regard to the anti-biotic prescribed per previous notes.  Pharmacist Tammy relayed that patient has history of yeast infections and asking if Dr Hilda LiasKeeling would want to prescribe medication for this.  Ph# [phone]

## 2017-09-06 NOTE — Procedures (Signed)
Thoracic Epidural Steroid Injection - Interlaminar Approach with Fluoroscopic Guidance  Patient: Anne ComptonJean T Davenport      Date of Birth: October 20, 1940 MRN: [id number] PCP: Gareth MorganKnowlton, Steve, MD      Visit Date: 08/28/2017   Universal Protocol:     Consent Given By: the patient  Position: PRONE  Additional Comments: Vital signs were monitored before and after the procedure. Patient was prepped and draped in the usual sterile fashion. The correct patient, procedure, and site was verified.   Injection Procedure Details:  Procedure Site One Meds Administered:  Meds ordered this encounter  Medications  . methylPREDNISolone acetate (DEPO-MEDROL) injection 80 mg     Laterality: Right  Location/Site:  T12-L1  Needle size: 20 G  Needle type: Tuohy  Needle Placement: Paramedian epidural  Findings:   -Comments: Excellent flow of contrast into the epidural space.  Procedure Details: Using a paramedian approach from the side mentioned above, the region overlying the inferior lamina was localized under fluoroscopic visualization and the soft tissues overlying this structure were infiltrated with 4 ml. of 1% Lidocaine without Epinephrine. The Tuohy needle was inserted into the epidural space using a paramedian approach.   The epidural space was localized using loss of resistance along with lateral and bi-planar fluoroscopic views.  After negative aspirate for air, blood, and CSF, a 2 ml. volume of Isovue-250 was injected into the epidural space and the flow of contrast was observed. Radiographs were obtained for documentation purposes.    The injectate was administered into the level noted above.   Additional Comments:  The patient tolerated the procedure well Dressing: Band-Aid    Post-procedure details: Patient was observed during the procedure. Post-procedure instructions were reviewed.  Patient left the clinic in stable condition.

## 2017-09-11 ENCOUNTER — Ambulatory Visit: Payer: Self-pay | Admitting: Adult Health

## 2018-01-08 ENCOUNTER — Ambulatory Visit: Payer: Medicare Other | Admitting: Orthopaedic Surgery

## 2018-01-22 ENCOUNTER — Ambulatory Visit: Payer: Medicare Other | Admitting: Orthopaedic Surgery

## 2018-01-23 ENCOUNTER — Ambulatory Visit: Payer: Medicare Other | Admitting: Orthopaedic Surgery

## 2018-01-29 ENCOUNTER — Ambulatory Visit (INDEPENDENT_AMBULATORY_CARE_PROVIDER_SITE_OTHER): Payer: Medicare Other | Admitting: Orthopaedic Surgery

## 2018-01-29 ENCOUNTER — Encounter: Payer: Self-pay | Admitting: Orthopaedic Surgery

## 2018-01-29 DIAGNOSIS — M25511 Pain in right shoulder: Secondary | ICD-10-CM | POA: Diagnosis not present

## 2018-01-29 DIAGNOSIS — G8929 Other chronic pain: Secondary | ICD-10-CM

## 2018-01-29 MED ORDER — TIZANIDINE HCL 4 MG PO TABS: ORAL_TABLET | ORAL | 0 refills | Status: DC

## 2018-01-29 NOTE — Progress Notes (Signed)
PROCEDURE NOTE:  The patient request injection, verbal consent was obtained.  The right shoulder was prepped appropriately after time out was performed.   Sterile technique was observed and injection of 1 cc of Depo-Medrol 40 mg with several cc's of plain xylocaine. Anesthesia was provided by ethyl chloride and a 20-gauge needle was used to inject the shoulder area. A posterior approach was used.  The injection was tolerated well.  A band aid dressing was applied.  The patient was advised to apply ice later today and tomorrow to the injection sight as needed.  Electronically Signed Darreld McleanWayne Tran Arzuaga, MD 7/23/20199:58 AM

## 2018-02-06 ENCOUNTER — Ambulatory Visit (INDEPENDENT_AMBULATORY_CARE_PROVIDER_SITE_OTHER): Payer: Medicare Other | Admitting: Orthopaedic Surgery

## 2018-02-06 ENCOUNTER — Encounter: Payer: Self-pay | Admitting: Orthopaedic Surgery

## 2018-02-06 DIAGNOSIS — M7061 Trochanteric bursitis, right hip: Secondary | ICD-10-CM

## 2018-02-06 NOTE — Progress Notes (Signed)
PROCEDURE NOTE:  The patient request injection, verbal consent was obtained.  The right trochanteric area of the hip was prepped appropriately after time out was performed.   Sterile technique was observed and injection of 1 cc of Depo-Medrol 40 mg with several cc's of plain xylocaine. Anesthesia was provided by ethyl chloride and a 20-gauge needle was used to inject the hip area. The injection was tolerated well.  A band aid dressing was applied.  The patient was advised to apply ice later today and tomorrow to the injection sight as needed.  I will see as needed.  Call if any problem.  Precautions discussed.   Electronically Signed Darreld McleanWayne Fortune Torosian, MD 7/31/201910:41 AM

## 2018-02-19 ENCOUNTER — Ambulatory Visit (INDEPENDENT_AMBULATORY_CARE_PROVIDER_SITE_OTHER): Payer: Medicare Other | Admitting: Adult Health

## 2018-02-19 ENCOUNTER — Encounter: Payer: Self-pay | Admitting: Adult Health

## 2018-02-19 VITALS — BP 101/57 | HR 71 | Ht 63.2 in | Wt 123.0 lb

## 2018-02-19 DIAGNOSIS — N812 Incomplete uterovaginal prolapse: Secondary | ICD-10-CM

## 2018-02-19 NOTE — Progress Notes (Signed)
  Subjective:     Patient ID: Anne ComptonJean T Davenport, female   DOB: February 19, 1941, 77 y.o.   MRN: [id number]  HPI Anne BernJean is a 77 year old white female, sp hysterectomy in complaining of pressure in bladder area, and some low back pain.She was seen at PCP, Dr [name]'s yesterday and was treated for UTI, has not started meds yet.She says she has been lifting heavy boxes, as she is moving her with her fiancee, and that she had bladder tact about 45 years ago after hysterectomy.   Review of Systems +pressure in bladder area, and some low back pain +some urge incontinence at times She is not currently sexually active, but hopes to be  Reviewed past medical,surgical, social and family history. Reviewed medications and allergies.     Objective:   Physical Exam BP (!) 101/57 (BP Location: Left Arm, Patient Position: Sitting, Cuff Size: Normal)   Pulse 71   Ht 5' 3.2" (1.605 m)   Wt 123 lb (55.8 kg)   BMI 21.65 kg/m   Skin warm and dry.Pelvic: external genitalia is normal in appearance, has 2 small sebaceous cyst right labia, vagina:pale with loss of moisture and rugae, +cystocele,urethra has no lesions or masses noted, cervix and uterus are absent,+relaxation at top of vagina, adnexa: no masses or tenderness noted. Bladder is non tender and no masses felt.On rectal exam no masses but +rectocele.  Don't do any more heavy lifting. Discussed pessary as option to see if relieves pressure.  Face time 15 minutes, with 50% counseling.     Assessment:     1. 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

## 2018-02-25 ENCOUNTER — Other Ambulatory Visit: Payer: Self-pay

## 2018-02-25 ENCOUNTER — Encounter: Payer: Self-pay | Admitting: Obstetrics & Gynecology

## 2018-02-25 ENCOUNTER — Ambulatory Visit (INDEPENDENT_AMBULATORY_CARE_PROVIDER_SITE_OTHER): Payer: Medicare Other | Admitting: Obstetrics & Gynecology

## 2018-02-25 VITALS — BP 131/71 | HR 80 | Ht 63.5 in | Wt 123.0 lb

## 2018-02-25 DIAGNOSIS — N812 Incomplete uterovaginal prolapse: Secondary | ICD-10-CM

## 2018-02-25 NOTE — Progress Notes (Signed)
Chief Complaint  Patient presents with  . pessary fitting    Blood pressure 131/71, pulse 80, height 5' 3.5" (1.613 m), weight 123 lb (55.8 kg).  Anne ComptonJean T Davenport presents today for pessary fitting She was seen 2 weeks ago by Cyril MourningJennifer Griffin NP and has significant pelvic relaxation defect s/p hysterectomy and subsequent anteriro repair  On exam her defect is appropt=riate for a #4 but due to previous anterior repair only a #2 fits well and is comfortable, pt instructed on how to push it up if needed    Exam reveals no undue vaginal mucosal pressure of breakdown, no discharge and no vaginal bleeding.  The pessary is removed, cleaned and replaced without difficulty.    Anne ComptonJean T Davenport will be sen back in 1 months for follow up.  Lazaro ArmsLuther H Tearsa Kowalewski, MD  02/25/2018 2:19 PM

## 2018-03-12 ENCOUNTER — Ambulatory Visit: Payer: Medicare Other | Admitting: Obstetrics & Gynecology

## 2018-03-25 ENCOUNTER — Other Ambulatory Visit (HOSPITAL_COMMUNITY): Payer: Self-pay | Admitting: Family Medicine

## 2018-03-25 DIAGNOSIS — Z1231 Encounter for screening mammogram for malignant neoplasm of breast: Secondary | ICD-10-CM

## 2018-03-26 ENCOUNTER — Ambulatory Visit (INDEPENDENT_AMBULATORY_CARE_PROVIDER_SITE_OTHER): Payer: Medicare Other | Admitting: Orthopaedic Surgery

## 2018-03-26 ENCOUNTER — Encounter: Payer: Self-pay | Admitting: Orthopaedic Surgery

## 2018-03-26 DIAGNOSIS — M25512 Pain in left shoulder: Secondary | ICD-10-CM

## 2018-03-26 DIAGNOSIS — G8929 Other chronic pain: Secondary | ICD-10-CM | POA: Diagnosis not present

## 2018-03-26 NOTE — Progress Notes (Signed)
PROCEDURE NOTE:  The patient request injection, verbal consent was obtained.  The left shoulder was prepped appropriately after time out was performed.   Sterile technique was observed and injection of 1 cc of Depo-Medrol 40 mg with several cc's of plain xylocaine. Anesthesia was provided by ethyl chloride and a 20-gauge needle was used to inject the shoulder area. A posterior approach was used.  The injection was tolerated well.  A band aid dressing was applied.  The patient was advised to apply ice later today and tomorrow to the injection sight as needed.  I will see as needed.  Electronically Signed Darreld McleanWayne Cleve Paolillo, MD 9/17/201910:46 AM

## 2018-04-01 ENCOUNTER — Ambulatory Visit (INDEPENDENT_AMBULATORY_CARE_PROVIDER_SITE_OTHER): Payer: Medicare Other | Admitting: Obstetrics & Gynecology

## 2018-04-01 ENCOUNTER — Other Ambulatory Visit: Payer: Self-pay

## 2018-04-01 ENCOUNTER — Encounter: Payer: Self-pay | Admitting: Obstetrics & Gynecology

## 2018-04-01 VITALS — BP 129/71 | HR 67 | Ht 63.0 in | Wt 125.0 lb

## 2018-04-01 DIAGNOSIS — N812 Incomplete uterovaginal prolapse: Secondary | ICD-10-CM | POA: Diagnosis not present

## 2018-04-01 DIAGNOSIS — Z4689 Encounter for fitting and adjustment of other specified devices: Secondary | ICD-10-CM

## 2018-04-01 NOTE — Progress Notes (Signed)
Chief Complaint  Patient presents with  . Follow-up    pessary    Blood pressure 129/71, pulse 67, height 5\' 3"  (1.6 m), weight 125 lb (56.7 kg).  Anne Davenport presents today for routine follow up related to her pessary.   She uses a milex ring with support #2, originally placed 02/2018 She reports no vaginal discharge or vaginal bleeding.  Exam reveals no undue vaginal mucosal pressure of breakdown, no discharge and no vaginal bleeding.  The pessary is removed, cleaned and replaced without difficulty.    Anne Davenport will be sen back in 3 months for continued follow up.  Lazaro ArmsLuther H Marialy Urbanczyk, MD  04/01/2018 11:04 AM

## 2018-04-15 ENCOUNTER — Telehealth: Payer: Self-pay | Admitting: *Deleted

## 2018-04-15 NOTE — Telephone Encounter (Signed)
Patient asked if she could remove pessary and clean herself.  Advised she could do so if she felt comfortable.  Will come in December for Dr Despina Hidden to maintain as well.

## 2018-05-06 ENCOUNTER — Ambulatory Visit (HOSPITAL_COMMUNITY): Payer: Medicare Other

## 2018-05-07 ENCOUNTER — Ambulatory Visit: Payer: Medicare Other | Admitting: Orthopaedic Surgery

## 2018-05-15 ENCOUNTER — Ambulatory Visit (INDEPENDENT_AMBULATORY_CARE_PROVIDER_SITE_OTHER): Payer: Medicare Other | Admitting: Orthopaedic Surgery

## 2018-05-15 ENCOUNTER — Encounter: Payer: Self-pay | Admitting: Orthopaedic Surgery

## 2018-05-15 VITALS — Ht 63.0 in | Wt 126.0 lb

## 2018-05-15 DIAGNOSIS — M25511 Pain in right shoulder: Secondary | ICD-10-CM | POA: Diagnosis not present

## 2018-05-15 DIAGNOSIS — G8929 Other chronic pain: Secondary | ICD-10-CM

## 2018-05-15 MED ORDER — HYDROCODONE-ACETAMINOPHEN 5-325 MG PO TABS: ORAL_TABLET | ORAL | 0 refills | Status: DC

## 2018-05-15 NOTE — Progress Notes (Signed)
PROCEDURE NOTE:  The patient request injection, verbal consent was obtained.  The right shoulder was prepped appropriately after time out was performed.   Sterile technique was observed and injection of 1 cc of Depo-Medrol 40 mg with several cc's of plain xylocaine. Anesthesia was provided by ethyl chloride and a 20-gauge needle was used to inject the shoulder area. A posterior approach was used.  The injection was tolerated well.  A band aid dressing was applied.  The patient was advised to apply ice later today and tomorrow to the injection sight as needed.  I have reviewed the [place] Controlled Substance Reporting System web site prior to prescribing narcotic medicine for this patient.   Electronically Signed Darreld Mclean, MD 11/6/20192:43 PM

## 2018-05-20 ENCOUNTER — Ambulatory Visit (HOSPITAL_COMMUNITY)
Admission: RE | Admit: 2018-05-20 | Discharge: 2018-05-20 | Disposition: A | Discharge status: Home / Self Care | Payer: Medicare Other | Source: Ambulatory Visit | Attending: Family Medicine | Admitting: Family Medicine

## 2018-05-20 DIAGNOSIS — Z1231 Encounter for screening mammogram for malignant neoplasm of breast: Secondary | ICD-10-CM | POA: Diagnosis not present

## 2018-05-28 ENCOUNTER — Telehealth: Payer: Self-pay | Admitting: Obstetrics & Gynecology

## 2018-05-28 ENCOUNTER — Encounter: Payer: Self-pay | Admitting: Obstetrics & Gynecology

## 2018-05-28 ENCOUNTER — Ambulatory Visit (INDEPENDENT_AMBULATORY_CARE_PROVIDER_SITE_OTHER): Payer: Medicare Other | Admitting: Obstetrics & Gynecology

## 2018-05-28 VITALS — BP 138/79 | HR 69 | Ht 63.0 in | Wt 126.5 lb

## 2018-05-28 DIAGNOSIS — N812 Incomplete uterovaginal prolapse: Secondary | ICD-10-CM

## 2018-05-28 DIAGNOSIS — Z4689 Encounter for fitting and adjustment of other specified devices: Secondary | ICD-10-CM | POA: Diagnosis not present

## 2018-05-28 NOTE — Telephone Encounter (Signed)
Patient called, is having issues with pessary and would like to speak to a nurse.  She has a follow up appointment on 06/24/18.  [facility]  [phone]

## 2018-05-28 NOTE — Progress Notes (Signed)
Chief Complaint  Patient presents with  . Pessary cleaning    removed pessary 2 weeks ago and was unable to place correctly    Blood pressure 138/79, pulse 69, height 5\' 3"  (1.6 m), weight 126 lb 8 oz (57.4 kg).  Anne Davenport presents today for routine follow up related to her pessary.   She uses a Milex ring with support #2 She reports no vaginal discharge or vaginal bleeding.  Exam reveals no undue vaginal mucosal pressure of breakdown, no discharge and no vaginal bleeding.  The pessary is removed, cleaned and replaced without difficulty.    Anne Davenport will be sen back in 4 months for continued follow up.  Lazaro ArmsLuther H Eure, MD  05/28/2018 3:03 PM

## 2018-05-28 NOTE — Telephone Encounter (Signed)
Spoke with pt. Pt took pessary out to clean and can't get it back in right. Has been out for 2 weeks. Pt has appt in December but wants to come in sooner. Pt states her back is starting to hurt. Call transferred to front desk for appt. JSY

## 2018-06-24 ENCOUNTER — Ambulatory Visit: Payer: Medicare Other | Admitting: Obstetrics & Gynecology

## 2018-07-16 IMAGING — DX DG ANKLE COMPLETE 3+V*R*
3 series · 3 of 3 positions shown · non-contrast
Comparison: Right ankle films of 01/20/2016

CLINICAL DATA: Ankle fracture, evaluate healing

EXAM:
RIGHT ANKLE - COMPLETE 3+ VIEW

[ankle ap]
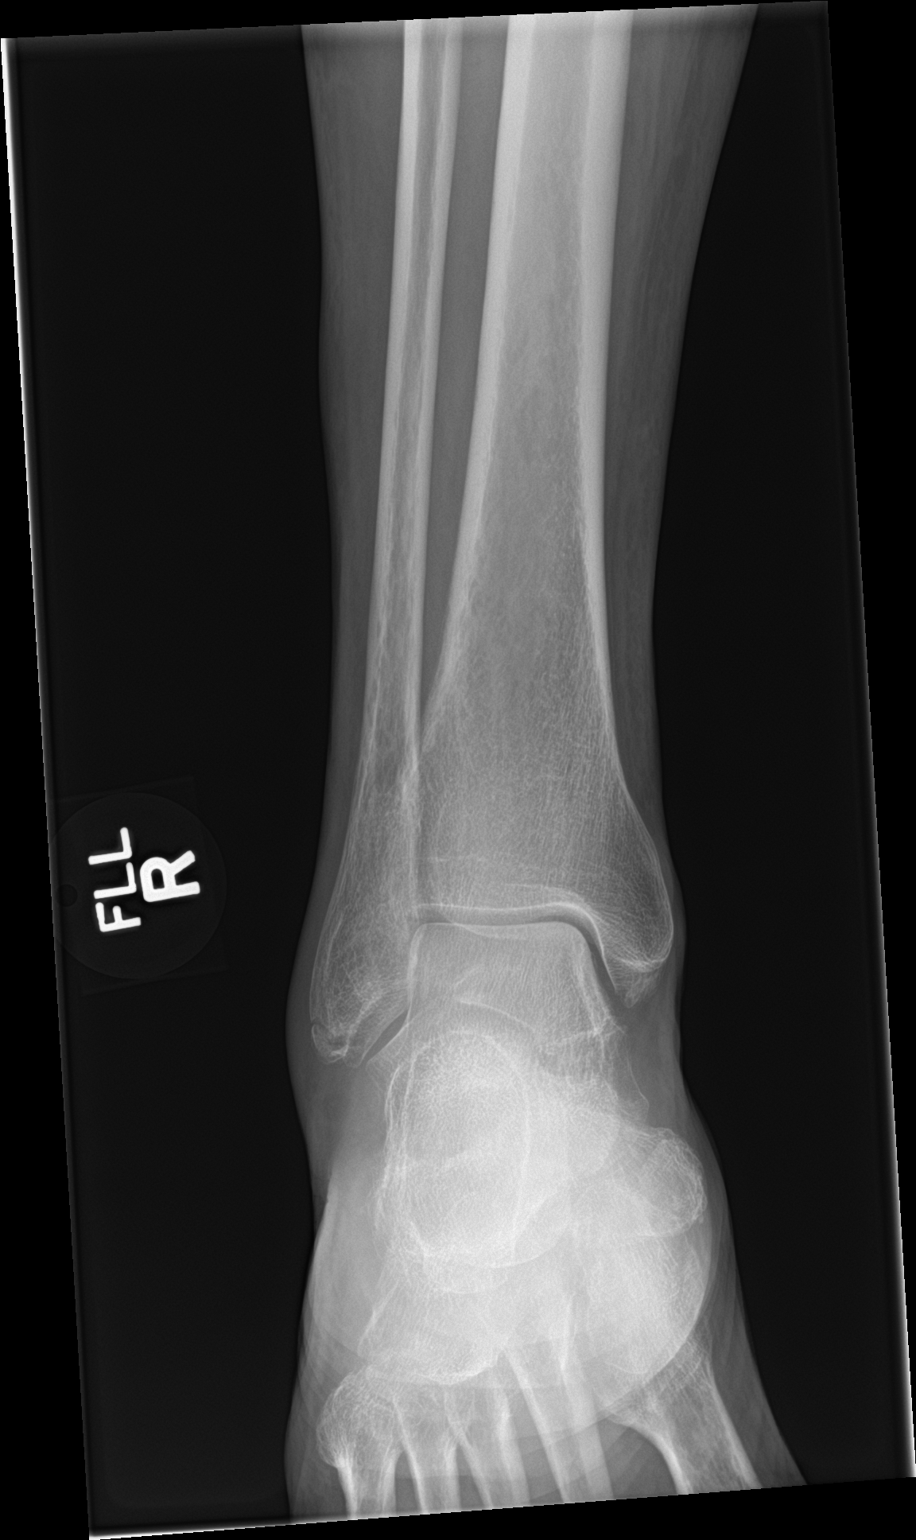

[ankle obl]
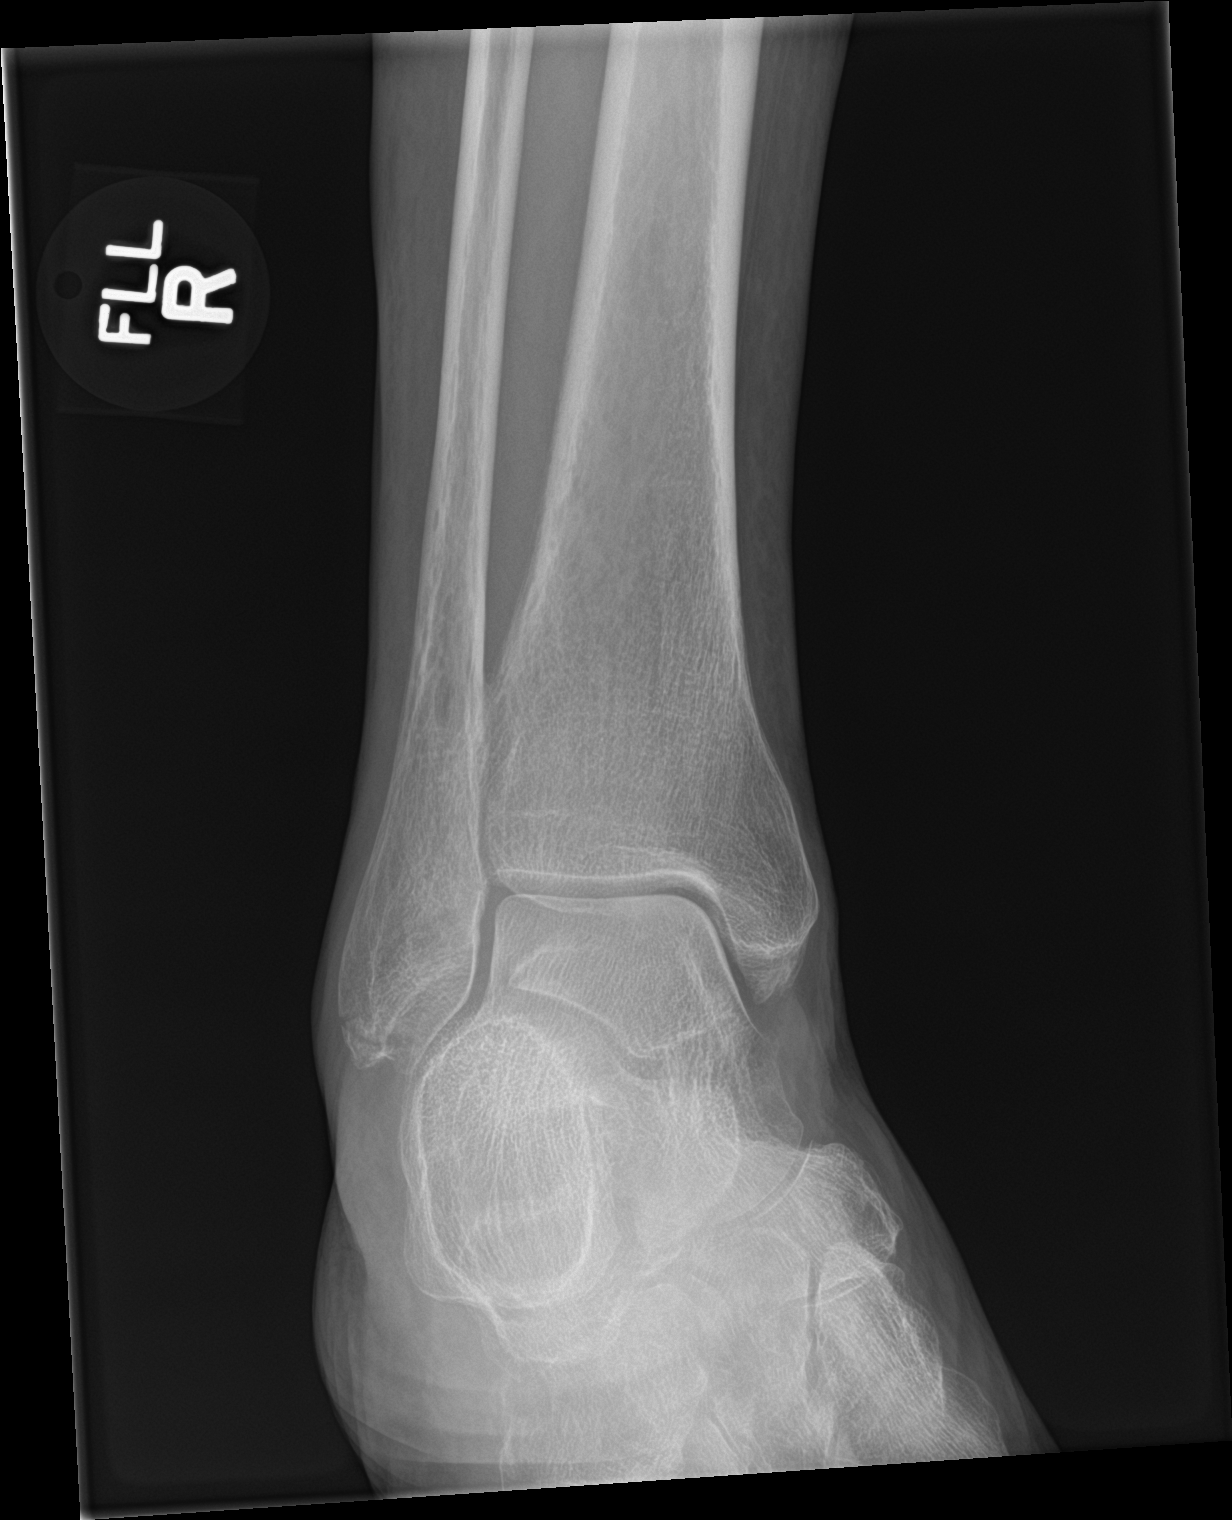

[ankle lat]
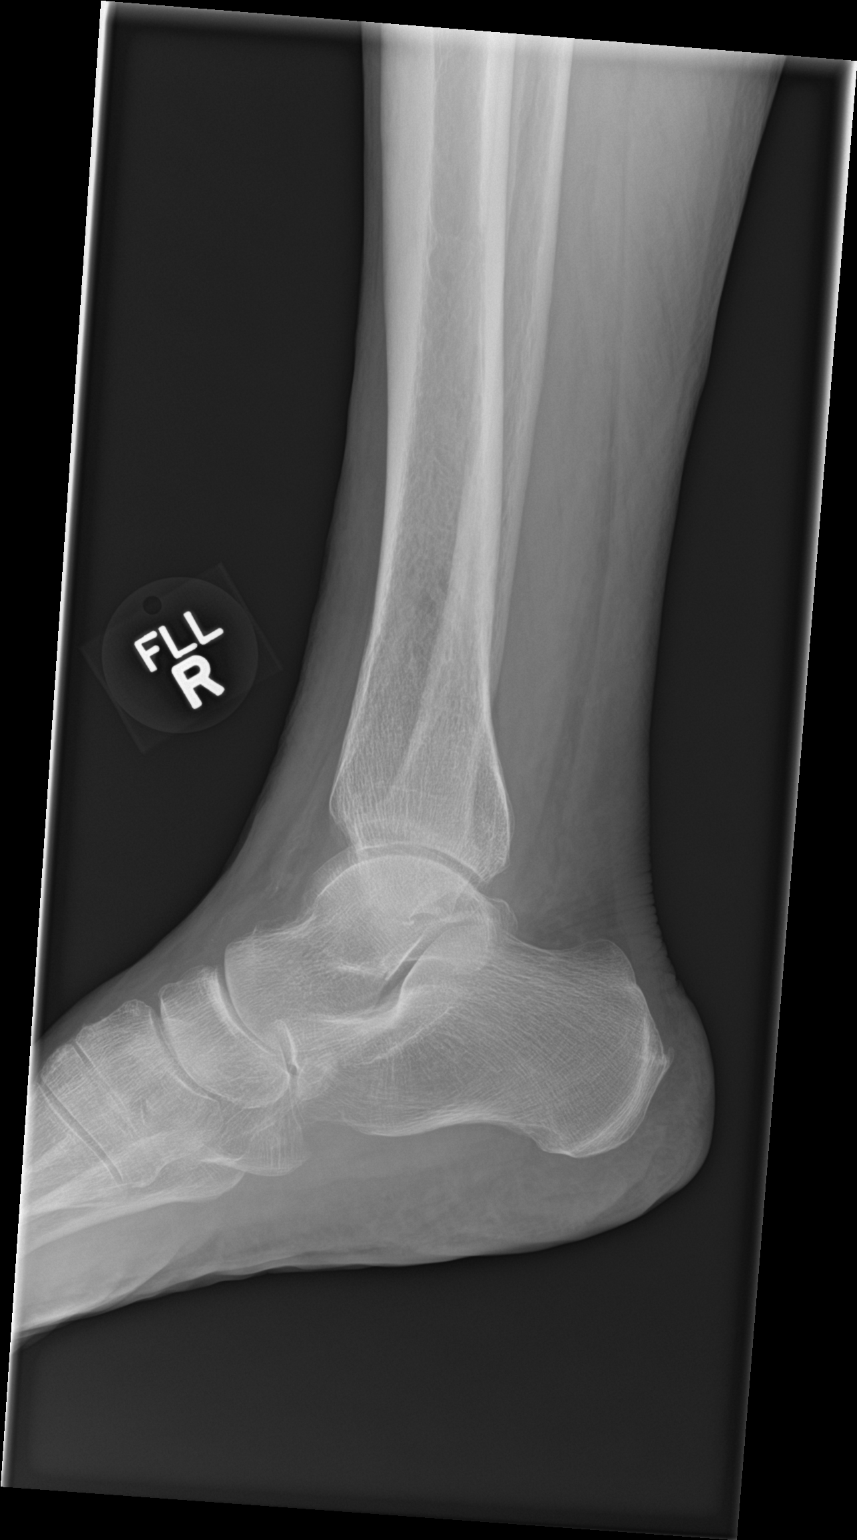

[3 of 3 positions shown; findings below may reference images not displayed]

FINDINGS: The fracture line is still visible through the tip of the distal
right fibula probably due to some bony resorption. No definite
callus formation is seen. No displacement is noted. The ankle joint
is unremarkable.
IMPRESSION: No change in fracture line of the nondisplaced distal right fibular
fracture.

## 2018-08-07 ENCOUNTER — Ambulatory Visit (INDEPENDENT_AMBULATORY_CARE_PROVIDER_SITE_OTHER): Payer: Medicare Other

## 2018-08-07 ENCOUNTER — Encounter: Payer: Self-pay | Admitting: Orthopaedic Surgery

## 2018-08-07 ENCOUNTER — Ambulatory Visit (INDEPENDENT_AMBULATORY_CARE_PROVIDER_SITE_OTHER): Payer: Medicare Other | Admitting: Orthopaedic Surgery

## 2018-08-07 VITALS — BP 139/75 | HR 66 | Ht 63.0 in | Wt 126.0 lb

## 2018-08-07 DIAGNOSIS — M25562 Pain in left knee: Secondary | ICD-10-CM

## 2018-08-07 NOTE — Progress Notes (Signed)
Patient WU:JWJX:Anne Davenport, female DOB:May 03, 1941, 78 y.o. BJY:[id number]  Chief Complaint  Patient presents with  . Knee Pain    Left knee    HPI  Anne ComptonJean T Davenport is a 78 y.o. female who has developed pain in the left knee that began about a week or ten days ago. She was getting up and felt a pop in the left knee and it gave way.  She sat down immediately.  It has been hurting since then.  She has a little swelling but that has improved. She has no further giving way.  She has no redness.  Nothing seems to help.  Heat, ice rubs no help.   Body mass index is 22.32 kg/m.  ROS  Review of Systems  Respiratory: Negative for cough and shortness of breath.   Cardiovascular: Negative for chest pain.  Endocrine: Positive for cold intolerance.  Musculoskeletal: Positive for arthralgias, back pain, gait problem and joint swelling.  Allergic/Immunologic: Positive for environmental allergies.  Psychiatric/Behavioral: The patient is nervous/anxious.   All other systems reviewed and are negative.   All other systems reviewed and are negative.  The following is a summary of the past history medically, past history surgically, known current medicines, social history and family history.  This information is gathered electronically by the computer from prior information and documentation.  I review this each visit and have found including this information at this point in the chart is beneficial and informative.    Past Medical History:  Diagnosis Date  . Anxiety   . Back pain    mid back  . Bartholin cyst 05/20/2015  . Family history of adverse reaction to anesthesia    Daughter stopped breathing with Ativan  . 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 Hyperlipemia   . Hypertension   . IBS (irritable bowel syndrome)   . 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 Screening for STD (sexually transmitted disease) 07/23/2015    Past Surgical History:   Procedure Laterality Date  . ABDOMINAL HYSTERECTOMY    . BLADDER REPAIR    . COLONOSCOPY N/A 06/24/2015   Procedure: COLONOSCOPY;  Surgeon: Malissa HippoNajeeb U Rehman, MD;  Location: [facility];  Service: Endoscopy;  Laterality: N/A;  730  . ESOPHAGOGASTRODUODENOSCOPY N/A 08/14/2014   Procedure: ESOPHAGOGASTRODUODENOSCOPY (EGD);  Surgeon: Malissa HippoNajeeb U Rehman, MD;  Location: [facility];  Service: Endoscopy;  Laterality: N/A;  1:10  . TONSILLECTOMY    . WRIST SURGERY Left     Family History  Problem Relation Age of Onset  . Diabetes Mother   . Hypertension Mother   . Alcohol abuse Brother   . Cancer Daughter        breast  . Hodgkin's lymphoma Daughter   . Hypertension Daughter   . Heart disease Daughter   . Diabetes Maternal Grandfather   . Heart disease Maternal Grandfather   . Other Paternal Grandfather        poor circulation    Social History Social History   Tobacco Use  . Smoking status: Former Smoker    Packs/day: 0.50    Years: 10.00    Pack years: 5.00    Types: Cigarettes    Last attempt to quit: 07/09/1984    Years since quitting: 34.1  . Smokeless tobacco: Never Used  Substance Use Topics  . Alcohol use: Yes    Alcohol/week: 0.0 standard drinks    Comment: "once in a while"  . Drug use: No  Allergies  Allergen Reactions  . Codeine Itching and Nausea And Vomiting  . Molds & Smuts Other (See Comments)    Reaction:  Itchy, watery eyes   . Zocor [Simvastatin] Nausea And Vomiting and Rash    Current Outpatient Medications  Medication Sig Dispense Refill  . acetaminophen (TYLENOL) 500 MG tablet Take 500 mg by mouth every 6 (six) hours as needed for mild pain, moderate pain or headache.     . Biotin 1000 MCG tablet Take 1,000 mcg by mouth at bedtime.     . Calcium Carbonate-Vitamin D (CALCIUM 600+D) 600-400 MG-UNIT tablet Take 1 tablet by mouth.     . estradiol (CLIMARA - DOSED IN MG/24 HR) 0.1 mg/24hr Place 1 patch onto the skin every Saturday.     .  fluorometholone (FML) 0.1 % ophthalmic suspension     . fluticasone (FLONASE) 50 MCG/ACT nasal spray Place 2 sprays into the nose daily as needed for rhinitis.     Marland Kitchen levothyroxine (SYNTHROID, LEVOTHROID) 75 MCG tablet Take 75 mcg by mouth at bedtime.     Marland Kitchen LORazepam (ATIVAN) 0.5 MG tablet Take 1 tablet (0.5 mg total) by mouth 2 (two) times daily as needed for anxiety. 60 tablet 0  . lovastatin (MEVACOR) 20 MG tablet     . Naproxen Sodium (ALEVE PO) Take by mouth as needed.    Marland Kitchen oxymetazoline (AFRIN) 0.05 % nasal spray Place 1 spray into both nostrils 2 (two) times daily as needed for congestion.    . sertraline (ZOLOFT) 50 MG tablet Take 25 mg by mouth at bedtime.     Marland Kitchen tiZANidine (ZANAFLEX) 4 MG tablet One by mouth every night before bed as needed for spasm 30 tablet 0   No current facility-administered medications for this visit.      Physical Exam  Blood pressure 139/75, pulse 66, height 5\' 3"  (1.6 m), weight 126 lb (57.2 kg).  Constitutional: overall normal hygiene, normal nutrition, well developed, normal grooming, normal body habitus. Assistive device:none  Musculoskeletal: gait and station Limp left, muscle tone and strength are normal, no tremors or atrophy is present.  .  Neurological: coordination overall normal.  Deep tendon reflex/nerve stretch intact.  Sensation normal.  Cranial nerves II-XII intact.   Skin:   Normal overall no scars, lesions, ulcers or rashes. No psoriasis.  Psychiatric: Alert and oriented x 3.  Recent memory intact, remote memory unclear.  Normal mood and affect. Well groomed.  Good eye contact.  Cardiovascular: overall no swelling, no varicosities, no edema bilaterally, normal temperatures of the legs and arms, no clubbing, cyanosis and good capillary refill.  Lymphatic: palpation is normal.  Left knee has no effusion, ROM 0 to 115, tender laterally, no crepitus, NV intact.  Slight limp to the left is present.  All other systems reviewed and are  negative   The patient has been educated about the nature of the problem(s) and counseled on treatment options.  The patient appeared to understand what I have discussed and is in agreement with it.  Encounter Diagnosis  Name Primary?  . Acute pain of left knee Yes   X-rays were done of the left knee, reported separately.  PROCEDURE NOTE:  The patient requests injections of the left knee , verbal consent was obtained.  The left knee was prepped appropriately after time out was performed.   Sterile technique was observed and injection of 1 cc of Depo-Medrol 40 mg with several cc's of plain xylocaine. Anesthesia was provided by ethyl  chloride and a 20-gauge needle was used to inject the knee area. The injection was tolerated well.  A band aid dressing was applied.  The patient was advised to apply ice later today and tomorrow to the injection sight as needed.  PLAN Call if any problems.  Precautions discussed.  Continue current medications.   Return to clinic 1 week   Consider MRI if not improved.  Electronically Signed Darreld Mclean, MD 1/29/20203:47 PM

## 2018-08-14 ENCOUNTER — Ambulatory Visit: Payer: Medicare Other | Admitting: Orthopaedic Surgery

## 2018-08-21 ENCOUNTER — Ambulatory Visit: Payer: Medicare Other | Admitting: Orthopaedic Surgery

## 2018-08-27 ENCOUNTER — Emergency Department (HOSPITAL_COMMUNITY)
Admission: EM | Admit: 2018-08-27 | Discharge: 2018-08-27 | Disposition: A | Discharge status: Home / Self Care | Payer: Medicare Other

## 2018-09-24 ENCOUNTER — Ambulatory Visit: Payer: Self-pay | Admitting: Obstetrics & Gynecology

## 2018-09-26 ENCOUNTER — Ambulatory Visit: Payer: Medicare Other | Admitting: Obstetrics & Gynecology

## 2018-10-09 ENCOUNTER — Telehealth (INDEPENDENT_AMBULATORY_CARE_PROVIDER_SITE_OTHER): Payer: Self-pay | Admitting: Radiology

## 2018-10-09 NOTE — Telephone Encounter (Signed)
I called patient, she opts to move appt out some, resched from 10/10/18 to 11/19/18.  She will call if pain is so bad that she cannot wait and we can see about bringing her in to inject.

## 2018-10-10 ENCOUNTER — Ambulatory Visit: Payer: Medicare Other | Admitting: Orthopaedic Surgery

## 2018-10-11 ENCOUNTER — Telehealth: Payer: Self-pay | Admitting: *Deleted

## 2018-10-11 NOTE — Telephone Encounter (Signed)
Patient informed that we are not allowing visitors or children to come to appointments at this time. Patient denies any contact with anyone suspected or confirmed of having COVID-19. Pt denies fever, cough, sob, muscle pain, diarrhea, rash, vomiting, abdominal pain, red eye, weakness, bruising or bleeding, joint pain or severe headache.  

## 2018-10-14 ENCOUNTER — Encounter: Payer: Self-pay | Admitting: Obstetrics & Gynecology

## 2018-10-14 ENCOUNTER — Ambulatory Visit (INDEPENDENT_AMBULATORY_CARE_PROVIDER_SITE_OTHER): Payer: Medicare Other | Admitting: Obstetrics & Gynecology

## 2018-10-14 ENCOUNTER — Other Ambulatory Visit: Payer: Self-pay

## 2018-10-14 VITALS — BP 158/77 | HR 72 | Temp 97.8°F | Ht 63.0 in | Wt 132.0 lb

## 2018-10-14 DIAGNOSIS — R300 Dysuria: Secondary | ICD-10-CM | POA: Diagnosis not present

## 2018-10-14 DIAGNOSIS — R350 Frequency of micturition: Secondary | ICD-10-CM

## 2018-10-14 DIAGNOSIS — Z4689 Encounter for fitting and adjustment of other specified devices: Secondary | ICD-10-CM | POA: Diagnosis not present

## 2018-10-14 DIAGNOSIS — N812 Incomplete uterovaginal prolapse: Secondary | ICD-10-CM

## 2018-10-14 LAB — POCT URINALYSIS DIPSTICK
Glucose, UA: NEGATIVE
Ketones, UA: NEGATIVE
Leukocytes, UA: NEGATIVE
Nitrite, UA: NEGATIVE
Protein, UA: NEGATIVE

## 2018-10-14 NOTE — Progress Notes (Signed)
Chief Complaint  Patient presents with  . needs pessary placed    pain around bladder area; burning at end of urination    Blood pressure (!) 158/77, pulse 72, temperature 97.8 F (36.6 C), height 5\' 3"  (1.6 m), weight 132 lb (59.9 kg).  Anne Davenport presents today for routine follow up related to her pessary.   She uses a Milex ring with support #2 She reports no vaginal discharge or vaginal bleeding.  Exam reveals no undue vaginal mucosal pressure of breakdown, no discharge and no vaginal bleeding.  The pessary is removed, cleaned and replaced without difficulty.    Anne Davenport will be sen back in 4 months for continued follow up.  Lazaro Arms, MD  10/14/2018 3:06 PM

## 2018-10-16 ENCOUNTER — Telehealth: Payer: Self-pay | Admitting: *Deleted

## 2018-10-16 LAB — URINE CULTURE: Organism ID, Bacteria: NO GROWTH

## 2018-10-16 NOTE — Telephone Encounter (Signed)
Patient informed urine culture was negative.  Verbalized understanding.

## 2018-10-22 ENCOUNTER — Other Ambulatory Visit: Payer: Self-pay

## 2018-10-22 ENCOUNTER — Ambulatory Visit (INDEPENDENT_AMBULATORY_CARE_PROVIDER_SITE_OTHER): Payer: Medicare Other | Admitting: Orthopaedic Surgery

## 2018-10-22 ENCOUNTER — Encounter: Payer: Self-pay | Admitting: Orthopaedic Surgery

## 2018-10-22 VITALS — Temp 98.1°F | Ht 63.0 in | Wt 132.0 lb

## 2018-10-22 DIAGNOSIS — M25511 Pain in right shoulder: Secondary | ICD-10-CM | POA: Diagnosis not present

## 2018-10-22 DIAGNOSIS — G8929 Other chronic pain: Secondary | ICD-10-CM

## 2018-10-22 NOTE — Progress Notes (Signed)
PROCEDURE NOTE:  The patient request injection, verbal consent was obtained.  The right shoulder was prepped appropriately after time out was performed.   Sterile technique was observed and injection of 1 cc of Depo-Medrol 40 mg with several cc's of plain xylocaine. Anesthesia was provided by ethyl chloride and a 20-gauge needle was used to inject the shoulder area. A posterior approach was used.  The injection was tolerated well.  A band aid dressing was applied.  The patient was advised to apply ice later today and tomorrow to the injection sight as needed.  Electronically Signed Darreld Mclean, MD 4/14/20209:12 AM

## 2018-11-01 NOTE — Telephone Encounter (Signed)
ERROR

## 2018-11-19 ENCOUNTER — Ambulatory Visit: Payer: Self-pay | Admitting: Orthopaedic Surgery

## 2018-12-17 ENCOUNTER — Other Ambulatory Visit: Payer: Self-pay

## 2018-12-17 ENCOUNTER — Ambulatory Visit (INDEPENDENT_AMBULATORY_CARE_PROVIDER_SITE_OTHER): Payer: Medicare Other | Admitting: Orthopaedic Surgery

## 2018-12-17 ENCOUNTER — Encounter: Payer: Self-pay | Admitting: Orthopaedic Surgery

## 2018-12-17 VITALS — BP 145/77 | HR 70 | Temp 97.2°F | Ht 63.0 in | Wt 132.0 lb

## 2018-12-17 DIAGNOSIS — M7062 Trochanteric bursitis, left hip: Secondary | ICD-10-CM

## 2018-12-17 MED ORDER — HYDROCODONE-ACETAMINOPHEN 5-325 MG PO TABS: ORAL_TABLET | ORAL | 0 refills | Status: DC

## 2018-12-17 MED ORDER — PREDNISONE 5 MG (21) PO TBPK: ORAL_TABLET | ORAL | 0 refills | Status: DC

## 2018-12-17 NOTE — Progress Notes (Addendum)
Patient Anne Davenport, female DOB:1941/06/12, 78 y.o. [id number]  Chief Complaint  Patient presents with  . Hip Pain    HPI  Anne ComptonJean T Davenport is a 78 y.o. female who has developed pain of the left lateral hip over the trochanteric area.  She has had bursitis of the right hip in the past but not the left. She says it feels the same. It hurts when she lays on it.  It is not red.  She has no trauma, no numbness.   Body mass index is 23.38 kg/m.  ROS  Review of Systems  Respiratory: Negative for cough and shortness of breath.   Cardiovascular: Negative for chest pain.  Endocrine: Positive for cold intolerance.  Musculoskeletal: Positive for arthralgias, back pain, gait problem and joint swelling.  Allergic/Immunologic: Positive for environmental allergies.  Psychiatric/Behavioral: The patient is nervous/anxious.   All other systems reviewed and are negative.   All other systems reviewed and are negative.  The following is a summary of the past history medically, past history surgically, known current medicines, social history and family history.  This information is gathered electronically by the computer from prior information and documentation.  I review this each visit and have found including this information at this point in the chart is beneficial and informative.    Past Medical History:  Diagnosis Date  . Anxiety   . Back pain    mid back  . Bartholin cyst 05/20/2015  . Family history of adverse reaction to anesthesia    Daughter stopped breathing with Ativan  . 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 Hyperlipemia   . Hypertension   . IBS (irritable bowel syndrome)   . 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 Screening for STD (sexually transmitted disease) 07/23/2015    Past Surgical History:  Procedure Laterality Date  . ABDOMINAL HYSTERECTOMY    . BLADDER REPAIR    . COLONOSCOPY N/A 06/24/2015   Procedure:  COLONOSCOPY;  Surgeon: Malissa HippoNajeeb U Rehman, MD;  Location: [facility];  Service: Endoscopy;  Laterality: N/A;  730  . ESOPHAGOGASTRODUODENOSCOPY N/A 08/14/2014   Procedure: ESOPHAGOGASTRODUODENOSCOPY (EGD);  Surgeon: Malissa HippoNajeeb U Rehman, MD;  Location: [facility];  Service: Endoscopy;  Laterality: N/A;  1:10  . TONSILLECTOMY    . WRIST SURGERY Left     Family History  Problem Relation Age of Onset  . Diabetes Mother   . Hypertension Mother   . Alcohol abuse Brother   . Cancer Daughter        breast  . Hodgkin's lymphoma Daughter   . Hypertension Daughter   . Heart disease Daughter   . Diabetes Maternal Grandfather   . Heart disease Maternal Grandfather   . Other Paternal Grandfather        poor circulation    Social History Social History   Tobacco Use  . Smoking status: Former Smoker    Packs/day: 0.50    Years: 10.00    Pack years: 5.00    Types: Cigarettes    Last attempt to quit: 07/09/1984    Years since quitting: 34.4  . Smokeless tobacco: Never Used  Substance Use Topics  . Alcohol use: Yes    Alcohol/week: 0.0 standard drinks    Comment: "once in a while"  . Drug use: No    Allergies  Allergen Reactions  . Codeine Itching and Nausea And Vomiting  . Molds & Smuts Other (See Comments)    Reaction:  Itchy,  watery eyes   . Zocor [Simvastatin] Nausea And Vomiting and Rash    Current Outpatient Medications  Medication Sig Dispense Refill  . acetaminophen (TYLENOL) 500 MG tablet Take 500 mg by mouth every 6 (six) hours as needed for mild pain, moderate pain or headache.     . Biotin 1000 MCG tablet Take 1,000 mcg by mouth daily.     . Calcium Carbonate-Vitamin D (CALCIUM 600+D) 600-400 MG-UNIT tablet Take 1 tablet by mouth.     . estradiol (CLIMARA - DOSED IN MG/24 HR) 0.1 mg/24hr Place 1 patch onto the skin every Saturday.     . fluticasone (FLONASE) 50 MCG/ACT nasal spray Place 2 sprays into the nose daily as needed for rhinitis.     Marland Kitchen HYDROcodone-acetaminophen  (NORCO/VICODIN) 5-325 MG tablet One tablet every four hours as needed for acute pain.  Limit of five days per [place]. 30 tablet 0  . levothyroxine (SYNTHROID, LEVOTHROID) 75 MCG tablet Take 75 mcg by mouth at bedtime.     Marland Kitchen LORazepam (ATIVAN) 0.5 MG tablet Take 1 tablet (0.5 mg total) by mouth 2 (two) times daily as needed for anxiety. 60 tablet 0  . lovastatin (MEVACOR) 20 MG tablet     . Naproxen Sodium (ALEVE PO) Take by mouth as needed.    Marland Kitchen oxymetazoline (AFRIN) 0.05 % nasal spray Place 1 spray into both nostrils 2 (two) times daily as needed for congestion.    . predniSONE (STERAPRED UNI-PAK 21 TAB) 5 MG (21) TBPK tablet Take 6 pills first day; 5 pills second day; 4 pills third day; 3 pills fourth day; 2 pills next day and 1 pill last day. 21 tablet 0  . sertraline (ZOLOFT) 50 MG tablet Take 25 mg by mouth at bedtime.     Marland Kitchen tiZANidine (ZANAFLEX) 4 MG tablet One by mouth every night before bed as needed for spasm 30 tablet 0   No current facility-administered medications for this visit.      Physical Exam  Blood pressure (!) 145/77, pulse 70, temperature (!) 97.2 F (36.2 C), height 5\' 3"  (1.6 m), weight 132 lb (59.9 kg).  Constitutional: overall normal hygiene, normal nutrition, well developed, normal grooming, normal body habitus. Assistive device:none  Musculoskeletal: gait and station Limp left, muscle tone and strength are normal, no tremors or atrophy is present.  .  Neurological: coordination overall normal.  Deep tendon reflex/nerve stretch intact.  Sensation normal.  Cranial nerves II-XII intact.   Skin:   Normal overall no scars, lesions, ulcers or rashes. No psoriasis.  Psychiatric: Alert and oriented x 3.  Recent memory intact, remote memory unclear.  Normal mood and affect. Well groomed.  Good eye contact.  Cardiovascular: overall no swelling, no varicosities, no edema bilaterally, normal temperatures of the legs and arms, no clubbing, cyanosis and good  capillary refill.  Lymphatic: palpation is normal.  Left hip is very tender over the lateral trochanteric area. She has no redness or swelling. ROM of hip is full but tender.  Limp left.  All other systems reviewed and are negative   The patient has been educated about the nature of the problem(s) and counseled on treatment options.  The patient appeared to understand what I have discussed and is in agreement with it.  Encounter Diagnosis  Name Primary?  . Trochanteric bursitis, left hip Yes    PLAN Call if any problems.  Precautions discussed.  Continue current medications. I will call in pain medicine and prednisone dose pack  as well.  Return to clinic 2 weeks PROCEDURE NOTE:  The patient request injection, verbal consent was obtained.  The left trochanteric area of the hip was prepped appropriately after time out was performed.   Sterile technique was observed and injection of 1 cc of Depo-Medrol 40 mg with several cc's of plain xylocaine. Anesthesia was provided by ethyl chloride and a 20-gauge needle was used to inject the hip area. The injection was tolerated well.  A band aid dressing was applied.  The patient was advised to apply ice later today and tomorrow to the injection sight as needed.   I have reviewed the [place] Controlled Substance Reporting System web site prior to prescribing narcotic medicine for this patient.   Electronically Signed Darreld McleanWayne Shelisa Fern, MD 6/9/202010:39 AM

## 2018-12-18 ENCOUNTER — Telehealth: Payer: Self-pay | Admitting: Orthopaedic Surgery

## 2018-12-18 NOTE — Telephone Encounter (Signed)
Patient called saying that she is having a lot of pain in her left hip joint going down toward her knee. She has been icing, used BioFreeze and such. States the pain feels like its in the bone. She has some trouble walking, yesterday afternoon she could not hardly lift her leg up to go up steps at home. There is no bruising or redness. States she is still icing of and on.   Please advise

## 2018-12-18 NOTE — Telephone Encounter (Signed)
Called the patient back and relayed message to her. She stated her leg was still hurting but not  as bad as it was when she called this morning. She  is ok with waiting for Prednisone to take effect.

## 2018-12-18 NOTE — Telephone Encounter (Signed)
Give the prednisone oral medicine to take effect.  See how it does.  She just started it.

## 2018-12-27 ENCOUNTER — Telehealth: Payer: Self-pay | Admitting: Orthopaedic Surgery

## 2018-12-27 NOTE — Telephone Encounter (Signed)
Anne Davenport called and stated that last night she was changing her clothes and all of a sudden she had  terrible pain in her left hip.  She said it felt like bone on bone.  She said she has finished the Prednisone that you prescribed.  She has pain medication but wants to know if there is anything else that you can give her for this.  She is going out of town Saturday morning and will be gone the next week.  She has an appointment for the next Tuesday, 01/07/19.  She said if she came back from the beach earlier, she would call me and I will get her in sooner.  She said she just felt like she needed something else.  Is there any antiinflammatory or something that you can give her?  She uses [employer]

## 2018-12-30 NOTE — Telephone Encounter (Signed)
She can take Aleve one twice a day.  Did the prednisone help?  Is she still at the beach and is it in [place]?  I could call in another dose pack if in .

## 2019-01-01 NOTE — Telephone Encounter (Signed)
I did speak with Anne Davenport and she is at [facility].  She is taking Aleve as Dr Luna Glasgow suggested.  She said she is coming home on Friday and we will see Dr. Luna Glasgow on Tuesday

## 2019-01-07 ENCOUNTER — Ambulatory Visit: Payer: Medicare Other | Admitting: Orthopaedic Surgery

## 2019-01-13 ENCOUNTER — Other Ambulatory Visit: Payer: Self-pay

## 2019-01-13 ENCOUNTER — Other Ambulatory Visit: Payer: Medicare Other

## 2019-01-13 DIAGNOSIS — Z20822 Contact with and (suspected) exposure to covid-19: Secondary | ICD-10-CM

## 2019-01-18 LAB — NOVEL CORONAVIRUS, NAA: SARS-CoV-2, NAA: NOT DETECTED

## 2019-02-13 ENCOUNTER — Ambulatory Visit: Payer: Medicare Other | Admitting: Obstetrics & Gynecology

## 2019-02-26 ENCOUNTER — Telehealth: Payer: Self-pay | Admitting: Obstetrics & Gynecology

## 2019-02-26 NOTE — Telephone Encounter (Signed)
Called patient and left message informing her that we are not allowing any visitors or children to come to visit with her at this time and we are requiring a mask to be worn during the visit. Asked if she has had any exposure to anyone suspected or confirmed of having COVID-19 or if she is experiencing any of the following: fever, cough, sob, muscle pain, severe headache, sore throat, diarrhea, loss of taste or smell or ear, nose or throat discomfort to call and reschedule.   °

## 2019-02-27 ENCOUNTER — Other Ambulatory Visit: Payer: Self-pay

## 2019-02-27 ENCOUNTER — Ambulatory Visit (INDEPENDENT_AMBULATORY_CARE_PROVIDER_SITE_OTHER): Payer: Medicare Other | Admitting: Obstetrics & Gynecology

## 2019-02-27 ENCOUNTER — Encounter: Payer: Self-pay | Admitting: Obstetrics & Gynecology

## 2019-02-27 VITALS — BP 126/68 | HR 74 | Ht 63.0 in | Wt 134.0 lb

## 2019-02-27 DIAGNOSIS — N812 Incomplete uterovaginal prolapse: Secondary | ICD-10-CM

## 2019-02-27 DIAGNOSIS — Z4689 Encounter for fitting and adjustment of other specified devices: Secondary | ICD-10-CM

## 2019-02-27 NOTE — Progress Notes (Signed)
Chief Complaint  Patient presents with  . Pessary Check    Blood pressure 126/68, pulse 74, height 5\' 3"  (1.6 m), weight 134 lb (60.8 kg).  [name] presents today for routine follow up related to her pessary.   She uses a Milex ring with support #2 She reports no vaginal discharge or vaginal bleeding.  Exam reveals no undue vaginal mucosal pressure of breakdown, no discharge and no vaginal bleeding.  The pessary is removed, cleaned and replaced without difficulty.    EDUARDO WURTH will be sen back in 4 months for continued follow up.  Florian Buff, MD  02/27/2019 2:16 PM

## 2019-03-13 ENCOUNTER — Ambulatory Visit (INDEPENDENT_AMBULATORY_CARE_PROVIDER_SITE_OTHER): Payer: Medicare Other | Admitting: Orthopaedic Surgery

## 2019-03-13 ENCOUNTER — Encounter: Payer: Self-pay | Admitting: Orthopaedic Surgery

## 2019-03-13 ENCOUNTER — Other Ambulatory Visit: Payer: Self-pay

## 2019-03-13 DIAGNOSIS — G8929 Other chronic pain: Secondary | ICD-10-CM | POA: Diagnosis not present

## 2019-03-13 DIAGNOSIS — M25511 Pain in right shoulder: Secondary | ICD-10-CM

## 2019-03-13 DIAGNOSIS — E1140 Type 2 diabetes mellitus with diabetic neuropathy, unspecified: Secondary | ICD-10-CM | POA: Insufficient documentation

## 2019-03-13 DIAGNOSIS — E032 Hypothyroidism due to medicaments and other exogenous substances: Secondary | ICD-10-CM | POA: Insufficient documentation

## 2019-03-13 NOTE — Progress Notes (Signed)
PROCEDURE NOTE:  The patient request injection, verbal consent was obtained.  The right shoulder was prepped appropriately after time out was performed.   Sterile technique was observed and injection of 1 cc of Depo-Medrol 40 mg with several cc's of plain xylocaine. Anesthesia was provided by ethyl chloride and a 20-gauge needle was used to inject the shoulder area. A posterior approach was used.  The injection was tolerated well.  A band aid dressing was applied.  The patient was advised to apply ice later today and tomorrow to the injection sight as needed.  Electronically Signed Sanjuana Kava, MD 9/3/202011:03 AM

## 2019-03-14 ENCOUNTER — Other Ambulatory Visit (HOSPITAL_COMMUNITY): Payer: Self-pay | Admitting: Family Medicine

## 2019-03-14 DIAGNOSIS — Z1231 Encounter for screening mammogram for malignant neoplasm of breast: Secondary | ICD-10-CM

## 2019-03-20 ENCOUNTER — Other Ambulatory Visit: Payer: Self-pay

## 2019-03-20 ENCOUNTER — Encounter: Payer: Self-pay | Admitting: Orthopaedic Surgery

## 2019-03-20 ENCOUNTER — Ambulatory Visit (INDEPENDENT_AMBULATORY_CARE_PROVIDER_SITE_OTHER): Payer: Medicare Other | Admitting: Orthopaedic Surgery

## 2019-03-20 DIAGNOSIS — M7061 Trochanteric bursitis, right hip: Secondary | ICD-10-CM

## 2019-03-20 NOTE — Progress Notes (Signed)
PROCEDURE NOTE:  The patient request injection, verbal consent was obtained.  The right trochanteric area of the hip was prepped appropriately after time out was performed.   Sterile technique was observed and injection of 1 cc of Depo-Medrol 40 mg with several cc's of plain xylocaine. Anesthesia was provided by ethyl chloride and a 20-gauge needle was used to inject the hip area. The injection was tolerated well.  A band aid dressing was applied.  The patient was advised to apply ice later today and tomorrow to the injection sight as needed.  I will see as needed.  Electronically Signed Sanjuana Kava, MD 9/10/202011:26 AM

## 2019-04-24 ENCOUNTER — Ambulatory Visit: Payer: Medicare Other | Admitting: Orthopaedic Surgery

## 2019-04-25 ENCOUNTER — Ambulatory Visit: Payer: Medicare Other | Admitting: Obstetrics & Gynecology

## 2019-05-05 ENCOUNTER — Other Ambulatory Visit: Payer: Self-pay

## 2019-05-05 ENCOUNTER — Ambulatory Visit
Admission: EM | Admit: 2019-05-05 | Discharge: 2019-05-05 | Disposition: A | Discharge status: Home / Self Care | Payer: Medicare Other | Attending: Emergency Medicine | Admitting: Emergency Medicine

## 2019-05-05 ENCOUNTER — Telehealth: Payer: Self-pay | Admitting: Orthopaedic Surgery

## 2019-05-05 ENCOUNTER — Ambulatory Visit (INDEPENDENT_AMBULATORY_CARE_PROVIDER_SITE_OTHER): Payer: Medicare Other

## 2019-05-05 ENCOUNTER — Telehealth: Payer: Self-pay | Admitting: Obstetrics & Gynecology

## 2019-05-05 DIAGNOSIS — S92355A Nondisplaced fracture of fifth metatarsal bone, left foot, initial encounter for closed fracture: Secondary | ICD-10-CM | POA: Diagnosis not present

## 2019-05-05 DIAGNOSIS — S93492A Sprain of other ligament of left ankle, initial encounter: Secondary | ICD-10-CM | POA: Diagnosis not present

## 2019-05-05 DIAGNOSIS — S92352A Displaced fracture of fifth metatarsal bone, left foot, initial encounter for closed fracture: Secondary | ICD-10-CM

## 2019-05-05 NOTE — ED Triage Notes (Signed)
Pt stood up while foot was asleep and fell on left foot

## 2019-05-05 NOTE — Discharge Instructions (Signed)
X-rays positive for avulsion fracture of the fifth metatarsal Probably ankle sprain as well Walking boot placed Continue conservative management of rest, ice, and elevation Use OTC tylenol as needed for pain Follow up with ortho tomorrow for further evaluation and management Return or go to the ER if you have any new or worsening symptoms (fever, chills, increased swelling, redness, pain, symptoms do not improve with treatment, etc...)

## 2019-05-05 NOTE — Telephone Encounter (Signed)

## 2019-05-05 NOTE — ED Provider Notes (Signed)
[place]   [id number] 05/05/19 Arrival Time: 8250  CC: Left ankle/ foot pain  SUBJECTIVE: History from: patient. Anne Davenport is a 78 y.o. female complains of left ankle and foot pain that began last night.  States her foot fell asleep, and when she went to stand on her left foot, she inverted her left ankle and fell.  Localizes the pain to the lateral ankle and foot.  Describes the pain as intermittent and achy/ throbbing in character.  Has tried OTC medications with minimal relief.  Symptoms are made worse with weight-bearing.  Reports foot fracture of RT foot.  Complains of associated swelling and bruising.  Denies fever, chills, erythema, weakness, numbness and tingling.  ROS: As per HPI.  All other pertinent ROS negative.     Past Medical History:  Diagnosis Date  . Anxiety   . Back pain    mid back  . Bartholin cyst 05/20/2015  . Family history of adverse reaction to anesthesia    Daughter stopped breathing with Ativan  . 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 Hyperlipemia   . Hypertension   . IBS (irritable bowel syndrome)   . 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 Screening for STD (sexually transmitted disease) 07/23/2015   Past Surgical History:  Procedure Laterality Date  . ABDOMINAL HYSTERECTOMY    . BLADDER REPAIR    . COLONOSCOPY N/A 06/24/2015   Procedure: COLONOSCOPY;  Surgeon: Rogene Houston, MD;  [facility];  Service: Endoscopy;  Laterality: N/A;  730  . ESOPHAGOGASTRODUODENOSCOPY N/A 08/14/2014   Procedure: ESOPHAGOGASTRODUODENOSCOPY (EGD);  Surgeon: Rogene Houston, MD;  Location: [facility];  Service: Endoscopy;  Laterality: N/A;  1:10  . TONSILLECTOMY    . WRIST SURGERY Left    Allergies  Allergen Reactions  . Codeine Itching and Nausea And Vomiting  . Molds & Smuts Other (See Comments)    Reaction:  Itchy, watery eyes   . Zocor [Simvastatin] Nausea And Vomiting and Rash    No current facility-administered medications on file prior to encounter.    Current Outpatient Medications on File Prior to Encounter  Medication Sig Dispense Refill  . acetaminophen (TYLENOL) 500 MG tablet Take 500 mg by mouth every 6 (six) hours as needed for mild pain, moderate pain or headache.     . Biotin 1000 MCG tablet Take 1,000 mcg by mouth daily.     . Calcium Carbonate-Vitamin D (CALCIUM 600+D) 600-400 MG-UNIT tablet Take 1 tablet by mouth.     . estradiol (CLIMARA - DOSED IN MG/24 HR) 0.1 mg/24hr Place 1 patch onto the skin every Saturday.     . fluticasone (FLONASE) 50 MCG/ACT nasal spray Place 2 sprays into the nose daily as needed for rhinitis.     Marland Kitchen HYDROcodone-acetaminophen (NORCO/VICODIN) 5-325 MG tablet One tablet every four hours as needed for acute pain.  Limit of five days per [place] statue. 30 tablet 0  . levothyroxine (SYNTHROID, LEVOTHROID) 75 MCG tablet Take 75 mcg by mouth at bedtime.     Marland Kitchen LORazepam (ATIVAN) 0.5 MG tablet Take 1 tablet (0.5 mg total) by mouth 2 (two) times daily as needed for anxiety. 60 tablet 0  . lovastatin (MEVACOR) 20 MG tablet     . Naproxen Sodium (ALEVE PO) Take by mouth as needed.    Marland Kitchen oxymetazoline (AFRIN) 0.05 % nasal spray Place 1 spray into both nostrils 2 (two) times daily as needed for congestion.    Marland Kitchen  sertraline (ZOLOFT) 50 MG tablet Take 25 mg by mouth at bedtime.      Social History   Socioeconomic History  . Marital status: Widowed    Spouse name: Not on file  . Number of children: Not on file  . Years of education: Not on file  . Highest education level: Not on file  Occupational History  . Not on file  Social Needs  . Financial resource strain: Not on file  . Food insecurity    Worry: Not on file    Inability: Not on file  . Transportation needs    Medical: Not on file    Non-medical: Not on file  Tobacco Use  . Smoking status: Former Smoker    Packs/day: 0.50    Years: 10.00    Pack years: 5.00    Types:  Cigarettes    Quit date: 07/09/1984    Years since quitting: 34.8  . Smokeless tobacco: Never Used  Substance and Sexual Activity  . Alcohol use: Yes    Alcohol/week: 0.0 standard drinks    Comment: "once in a while"  . Drug use: No  . Sexual activity: Yes    Birth control/protection: Surgical    Comment: hyst  Lifestyle  . Physical activity    Days per week: Not on file    Minutes per session: Not on file  . Stress: Not on file  Relationships  . Social Musician on phone: Not on file    Gets together: Not on file    Attends religious service: Not on file    Active member of club or organization: Not on file    Attends meetings of clubs or organizations: Not on file    Relationship status: Not on file  . Intimate partner violence    Fear of current or ex partner: Not on file    Emotionally abused: Not on file    Physically abused: Not on file    Forced sexual activity: Not on file  Other Topics Concern  . Not on file  Social History Narrative  . Not on file   Family History  Problem Relation Age of Onset  . Diabetes Mother   . Hypertension Mother   . Alcohol abuse Brother   . Cancer Daughter        breast  . Hodgkin's lymphoma Daughter   . Hypertension Daughter   . Heart disease Daughter   . Diabetes Maternal Grandfather   . Heart disease Maternal Grandfather   . Other Paternal Grandfather        poor circulation    OBJECTIVE:  Vitals:   05/05/19 1305  BP: 130/70  Pulse: 70  Resp: 18  Temp: 98 F (36.7 C)  SpO2: 95%    General appearance: ALERT; in no acute distress.  Head: NCAT Lungs: Normal respiratory effort CV: Doralis pedis pulses 2+ bilaterally. Cap refill < 2 seconds Musculoskeletal: Left foot/ ankle Inspection: Obvious swelling and ecchymosis over lateral foot and ankle Palpation: TTP over ATFL of lateral ankle; and proximal fifth MT ROM: LROM about the ankle  Strength: 4+/5 dorsiflexion, 4+/5 plantar flexion Skin: warm and dry  Neurologic: Ambulates with antalgic gait; Sensation intact about the lower extremities Psychological: alert and cooperative; normal mood and affect  DIAGNOSTIC STUDIES:  Dg Foot Complete Left  Result Date: 05/05/2019 CLINICAL DATA:  Lateral foot pain since falling and twisting foot last night. EXAM: LEFT FOOT - COMPLETE 3+ VIEW COMPARISON:  None. FINDINGS:  The bones are demineralized. There is a probable nondisplaced avulsion fracture involving the lateral base of the 5th metatarsal, best seen on the oblique view. No other acute osseous findings are identified. There are mild-to-moderate degenerative changes at the 1st metatarsophalangeal joint. There is mild dorsal forefoot soft tissue swelling without evidence of foreign body. IMPRESSION: 1. Probable nondisplaced avulsion fracture involving the lateral base of the 5th metatarsal. No other acute osseous findings. 2. Mild to moderate 1st metatarsophalangeal degenerative changes. Electronically Signed   By: Carey BullocksWilliam  Veazey M.D.   On: 05/05/2019 12:16     X-rays positive of avulsion fracture of fifth MT  I have reviewed the x-rays myself and the radiologist interpretation. I am in agreement with the radiologist interpretation.     ASSESSMENT & PLAN:  1. Sprain of anterior talofibular ligament of left ankle, initial encounter   2. Closed fracture of base of fifth metatarsal bone of left foot, initial encounter     X-rays positive for avulsion fracture of the fifth metatarsal Probably ankle sprain as well Walking boot placed Continue conservative management of rest, ice, and elevation Use OTC tylenol as needed for pain Follow up with ortho tomorrow for further evaluation and management Return or go to the ER if you have any new or worsening symptoms (fever, chills, increased swelling, redness, pain, symptoms do not improve with treatment, etc...)   Reviewed expectations re: course of current medical issues. Questions answered. Outlined  signs and symptoms indicating need for more acute intervention. Patient verbalized understanding. After Visit Summary given.    Rennis HardingWurst, Slayde Brault, PA-C 05/05/19 1307

## 2019-05-05 NOTE — Telephone Encounter (Signed)
Call received from patient - states had a fall over the weekend and was hoping to be seen today; said normally sees Dr Luna Glasgow. Relayed unfortunately no openings today for Dr Aline Brochure; relayed protocol for immediate appointment request and injury with no treatment - urgent care, emergency room or primary care. States will go on to new [facility] in [place]. Aware we can scheduled with Dr Luna Glasgow tomorrow when he returns to clinic; aware to call back for appointment.

## 2019-05-06 ENCOUNTER — Ambulatory Visit: Payer: Medicare Other | Admitting: Obstetrics & Gynecology

## 2019-05-08 ENCOUNTER — Other Ambulatory Visit: Payer: Self-pay

## 2019-05-08 ENCOUNTER — Encounter: Payer: Self-pay | Admitting: Orthopaedic Surgery

## 2019-05-08 ENCOUNTER — Ambulatory Visit (INDEPENDENT_AMBULATORY_CARE_PROVIDER_SITE_OTHER): Payer: Medicare Other | Admitting: Orthopaedic Surgery

## 2019-05-08 VITALS — BP 144/72 | HR 72 | Ht 63.0 in | Wt 129.0 lb

## 2019-05-08 DIAGNOSIS — S92352A Displaced fracture of fifth metatarsal bone, left foot, initial encounter for closed fracture: Secondary | ICD-10-CM

## 2019-05-08 DIAGNOSIS — S92351A Displaced fracture of fifth metatarsal bone, right foot, initial encounter for closed fracture: Secondary | ICD-10-CM

## 2019-05-08 NOTE — Progress Notes (Signed)
Patient Anne Davenport, female DOB:January 01, 1941, 78 y.o. [name]  Chief Complaint  Patient presents with  . Foot Pain    left foot injury fell at home 05/04/19    HPI  Anne Davenport is a 78 y.o. female who fell at home and hurt her left foot on 05-04-2019.  She was seen at the urgent care and x-rays showed: IMPRESSION: 1. Probable nondisplaced avulsion fracture involving the lateral base of the 5th metatarsal. No other acute osseous findings. 2. Mild to moderate 1st metatarsophalangeal degenerative changes.  She was placed in a CAM walker.  She has no other injury.  She has considerable ecchymosis.  Her pain is controlled.  I have reviewed the notes and x-rays from the Urgent Care.  Body mass index is 22.85 kg/m.  ROS  Review of Systems  Respiratory: Negative for cough and shortness of breath.   Cardiovascular: Negative for chest pain.  Endocrine: Positive for cold intolerance.  Musculoskeletal: Positive for arthralgias, back pain, gait problem and joint swelling.  Allergic/Immunologic: Positive for environmental allergies.  Psychiatric/Behavioral: The patient is nervous/anxious.   All other systems reviewed and are negative.   All other systems reviewed and are negative.  The following is a summary of the past history medically, past history surgically, known current medicines, social history and family history.  This information is gathered electronically by the computer from prior information and documentation.  I review this each visit and have found including this information at this point in the chart is beneficial and informative.    Past Medical History:  Diagnosis Date  . Anxiety   . Back pain    mid back  . Bartholin cyst 05/20/2015  . Family history of adverse reaction to anesthesia    Daughter stopped breathing with Ativan  . 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 Hyperlipemia   . Hypertension   . IBS (irritable bowel syndrome)   . Pelvic  relaxation due to vaginal vault prolapse, 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  . Screening for STD (sexually transmitted disease) 07/23/2015    Past Surgical History:  Procedure Laterality Date  . ABDOMINAL HYSTERECTOMY    . BLADDER REPAIR    . COLONOSCOPY N/A 06/24/2015   Procedure: COLONOSCOPY;  Surgeon: Malissa Hippo, MD;  [facility];  Service: Endoscopy;  Laterality: N/A;  730  . ESOPHAGOGASTRODUODENOSCOPY N/A 08/14/2014   Procedure: ESOPHAGOGASTRODUODENOSCOPY (EGD);  Surgeon: Malissa Hippo, MD;  [facility];  Service: Endoscopy;  Laterality: N/A;  1:10  . TONSILLECTOMY    . WRIST SURGERY Left     Family History  Problem Relation Age of Onset  . Diabetes Mother   . Hypertension Mother   . Alcohol abuse Brother   . Cancer Daughter        breast  . Hodgkin's lymphoma Daughter   . Hypertension Daughter   . Heart disease Daughter   . Diabetes Maternal Grandfather   . Heart disease Maternal Grandfather   . Other Paternal Grandfather        poor circulation    Social History Social History   Tobacco Use  . Smoking status: Former Smoker    Packs/day: 0.50    Years: 10.00    Pack years: 5.00    Types: Cigarettes    Quit date: 07/09/1984    Years since quitting: 34.8  . Smokeless tobacco: Never Used  Substance Use Topics  . Alcohol use: Yes    Alcohol/week: 0.0 standard drinks    Comment: "  once in a while"  . Drug use: No    Allergies  Allergen Reactions  . Codeine Itching and Nausea And Vomiting  . Molds & Smuts Other (See Comments)    Reaction:  Itchy, watery eyes   . Zocor [Simvastatin] Nausea And Vomiting and Rash    Current Outpatient Medications  Medication Sig Dispense Refill  . acetaminophen (TYLENOL) 500 MG tablet Take 500 mg by mouth every 6 (six) hours as needed for mild pain, moderate pain or headache.     . Biotin 1000 MCG tablet Take 1,000 mcg by mouth daily.     . Calcium Carbonate-Vitamin D (CALCIUM 600+D) 600-400 MG-UNIT  tablet Take 1 tablet by mouth.     . estradiol (CLIMARA - DOSED IN MG/24 HR) 0.1 mg/24hr Place 1 patch onto the skin every Saturday.     . fluticasone (FLONASE) 50 MCG/ACT nasal spray Place 2 sprays into the nose daily as needed for rhinitis.     Marland Kitchen. HYDROcodone-acetaminophen (NORCO/VICODIN) 5-325 MG tablet One tablet every four hours as needed for acute pain.  Limit of five days per [place] statue. 30 tablet 0  . levothyroxine (SYNTHROID, LEVOTHROID) 75 MCG tablet Take 75 mcg by mouth at bedtime.     Marland Kitchen. LORazepam (ATIVAN) 0.5 MG tablet Take 1 tablet (0.5 mg total) by mouth 2 (two) times daily as needed for anxiety. 60 tablet 0  . LORazepam (ATIVAN) 1 MG tablet     . lovastatin (MEVACOR) 20 MG tablet     . Naproxen Sodium (ALEVE PO) Take by mouth as needed.    Marland Kitchen. oxymetazoline (AFRIN) 0.05 % nasal spray Place 1 spray into both nostrils 2 (two) times daily as needed for congestion.    . sertraline (ZOLOFT) 50 MG tablet Take 25 mg by mouth at bedtime.      No current facility-administered medications for this visit.      Physical Exam  Blood pressure (!) 144/72, pulse 72, height 5\' 3"  (1.6 m), weight 129 lb (58.5 kg).  Constitutional: overall normal hygiene, normal nutrition, well developed, normal grooming, normal body habitus. Assistive device:CAM walker left  Musculoskeletal: gait and station Limp left, muscle tone and strength are normal, no tremors or atrophy is present.  .  Neurological: coordination overall normal.  Deep tendon reflex/nerve stretch intact.  Sensation normal.  Cranial nerves II-XII intact.   Skin:   Normal overall no scars, lesions, ulcers or rashes. No psoriasis.  Psychiatric: Alert and oriented x 3.  Recent memory intact, remote memory unclear.  Normal mood and affect. Well groomed.  Good eye contact.  Cardiovascular: overall no swelling, no varicosities, no edema bilaterally, normal temperatures of the legs and arms, no clubbing, cyanosis and good capillary  refill.  Lymphatic: palpation is normal.  Left foot has considerable ecchymosis but no redness. She is tender over the fifth metatarsal base.  ROM ankle is good.  NV intact.  All other systems reviewed and are negative   The patient has been educated about the nature of the problem(s) and counseled on treatment options.  The patient appeared to understand what I have discussed and is in agreement with it.  Encounter Diagnosis  Name Primary?  . Closed fracture of base of fifth metatarsal bone of right foot, initial encounter Yes    PLAN Call if any problems.  Precautions discussed.  Continue current medications.   Return to clinic 3 weeks   X-rays on return.  Contrast bath sheet of instructions given.  Electronically  Signed Sanjuana Kava, MD 10/29/202010:07 AM

## 2019-05-20 ENCOUNTER — Emergency Department (HOSPITAL_COMMUNITY): Payer: Medicare Other

## 2019-05-20 ENCOUNTER — Other Ambulatory Visit: Payer: Self-pay

## 2019-05-20 ENCOUNTER — Emergency Department (HOSPITAL_COMMUNITY)
Admission: EM | Admit: 2019-05-20 | Discharge: 2019-05-20 | Disposition: A | Discharge status: Home / Self Care | Payer: Medicare Other | Attending: Emergency Medicine | Admitting: Emergency Medicine

## 2019-05-20 DIAGNOSIS — I10 Essential (primary) hypertension: Secondary | ICD-10-CM | POA: Diagnosis not present

## 2019-05-20 DIAGNOSIS — N132 Hydronephrosis with renal and ureteral calculous obstruction: Secondary | ICD-10-CM | POA: Insufficient documentation

## 2019-05-20 DIAGNOSIS — Z79899 Other long term (current) drug therapy: Secondary | ICD-10-CM | POA: Insufficient documentation

## 2019-05-20 DIAGNOSIS — R109 Unspecified abdominal pain: Secondary | ICD-10-CM

## 2019-05-20 DIAGNOSIS — E119 Type 2 diabetes mellitus without complications: Secondary | ICD-10-CM | POA: Insufficient documentation

## 2019-05-20 DIAGNOSIS — Z87891 Personal history of nicotine dependence: Secondary | ICD-10-CM | POA: Insufficient documentation

## 2019-05-20 LAB — BASIC METABOLIC PANEL
Anion gap: 8 (ref 5–15)
BUN: 26 mg/dL — ABNORMAL HIGH (ref 8–23)
CO2: 22 mmol/L (ref 22–32)
Calcium: 9.3 mg/dL (ref 8.9–10.3)
Chloride: 107 mmol/L (ref 98–111)
Creatinine, Ser: 0.82 mg/dL (ref 0.44–1.00)
GFR calc Af Amer: >60 mL/min (ref >60)
GFR calc non Af Amer: >60 mL/min (ref >60)
Glucose, Bld: 137 mg/dL — ABNORMAL HIGH (ref 70–99)
Potassium: 3.6 mmol/L (ref 3.5–5.1)
Sodium: 137 mmol/L (ref 135–145)

## 2019-05-20 LAB — CBC WITH DIFFERENTIAL/PLATELET
Abs Immature Granulocytes: 0.03 10*3/uL (ref 0.00–0.07)
Basophils Absolute: 0 10*3/uL (ref 0.0–0.1)
Basophils Relative: 0 %
Eosinophils Absolute: 0.1 10*3/uL (ref 0.0–0.5)
Eosinophils Relative: 1 %
HCT: 44.3 % (ref 36.0–46.0)
Hemoglobin: 14 g/dL (ref 12.0–15.0)
Immature Granulocytes: 0 %
Lymphocytes Relative: 28 %
Lymphs Abs: 2.6 10*3/uL (ref 0.7–4.0)
MCH: 28.7 pg (ref 26.0–34.0)
MCHC: 31.6 g/dL (ref 30.0–36.0)
MCV: 90.8 fL (ref 80.0–100.0)
Monocytes Absolute: 0.8 10*3/uL (ref 0.1–1.0)
Monocytes Relative: 9 %
Neutro Abs: 5.7 10*3/uL (ref 1.7–7.7)
Neutrophils Relative %: 62 %
Platelets: 241 10*3/uL (ref 150–400)
RBC: 4.88 MIL/uL (ref 3.87–5.11)
RDW: 13.2 % (ref 11.5–15.5)
WBC: 9.2 10*3/uL (ref 4.0–10.5)
nRBC: 0 % (ref 0.0–0.2)

## 2019-05-20 LAB — URINALYSIS, ROUTINE W REFLEX MICROSCOPIC
Bacteria, UA: NONE SEEN
Bilirubin Urine: NEGATIVE
Glucose, UA: NEGATIVE mg/dL
Ketones, ur: NEGATIVE mg/dL
Leukocytes,Ua: NEGATIVE
Nitrite: NEGATIVE
Protein, ur: NEGATIVE mg/dL
RBC / HPF: >50 RBC/hpf — ABNORMAL HIGH (ref 0–5)
Specific Gravity, Urine: 1.018 (ref 1.005–1.030)
pH: 7 (ref 5.0–8.0)

## 2019-05-20 MED ORDER — KETOROLAC TROMETHAMINE 30 MG/ML IJ SOLN
15.0000 mg | Freq: Once | INTRAMUSCULAR | Status: AC
  Administered 2019-05-20: 15 mg via INTRAVENOUS
  Filled 2019-05-20: qty 1

## 2019-05-20 MED ORDER — ONDANSETRON HCL 4 MG/2ML IJ SOLN
4.0000 mg | Freq: Once | INTRAMUSCULAR | Status: AC
  Administered 2019-05-20: 4 mg via INTRAVENOUS
  Filled 2019-05-20: qty 2

## 2019-05-20 MED ORDER — FENTANYL CITRATE (PF) 100 MCG/2ML IJ SOLN
100.0000 ug | Freq: Once | INTRAMUSCULAR | Status: AC
  Administered 2019-05-20: 100 ug via INTRAVENOUS
  Filled 2019-05-20: qty 2

## 2019-05-20 MED ORDER — ONDANSETRON 8 MG PO TBDP: 8.0000 mg | ORAL_TABLET | Freq: Three times a day (TID) | ORAL | 0 refills | Status: DC | PRN

## 2019-05-20 NOTE — ED Triage Notes (Signed)
Pt arrived via [facility] for left flank pain and bloody urine starting yesterday.. Pt states that she has an obgyn apt for her prolapsed bladder 11/19. Anxious in triage

## 2019-05-20 NOTE — ED Notes (Signed)
Patient transported to CT 

## 2019-05-20 NOTE — ED Provider Notes (Signed)
[facility] EMERGENCY DEPARTMENT Provider Note   CSN: [id number] Arrival date & time: 05/20/19  0217     History   Chief Complaint Chief Complaint  Patient presents with  . Flank Pain    left    HPI Anne Davenport is a 78 y.o. female.     The history is provided by the patient.  Flank Pain This is a new problem. The current episode started 6 to 12 hours ago. The problem occurs constantly. The problem has been rapidly worsening. Associated symptoms include abdominal pain. Pertinent negatives include no chest pain and no shortness of breath. Nothing aggravates the symptoms. Nothing relieves the symptoms. She has tried nothing for the symptoms.  Patient with history of hypertension, hyperlipidemia presents with left flank pain.  She reports having mild dysuria and hematuria yesterday.  Since then she has had worsening left flank pain.  No fevers but reports nausea.  No chest pain or shortness of breath  Past Medical History:  Diagnosis Date  . Anxiety   . Back pain    mid back  . Bartholin cyst 05/20/2015  . Family history of adverse reaction to anesthesia    Daughter stopped breathing with Ativan  . 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 Hyperlipemia   . Hypertension   . IBS (irritable bowel syndrome)   . 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 Screening for STD (sexually transmitted disease) 07/23/2015    Patient Active Problem List   Diagnosis Date Noted  . Type 2 diabetes mellitus with diabetic neuropathy (HCC) 03/13/2019  . 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  . Right knee pain 08/18/2015  . Right shoulder pain 08/18/2015  . 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 Screening for STD (sexually transmitted disease) 07/23/2015  . Bartholin cyst 05/20/2015  . 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 Iatrogenic hypothyroidism 04/09/2015  . High cholesterol 09/07/2014     Past Surgical History:  Procedure Laterality Date  . ABDOMINAL HYSTERECTOMY    . BLADDER REPAIR    . COLONOSCOPY N/A 06/24/2015   Procedure: COLONOSCOPY;  Surgeon: Malissa Hippo, MD;  [facility];  Service: Endoscopy;  Laterality: N/A;  730  . ESOPHAGOGASTRODUODENOSCOPY N/A 08/14/2014   Procedure: ESOPHAGOGASTRODUODENOSCOPY (EGD);  Surgeon: Malissa Hippo, MD;  [facility];  Service: Endoscopy;  Laterality: N/A;  1:10  . TONSILLECTOMY    . WRIST SURGERY Left      OB History    Gravida  4   Para  3   Term      Preterm      AB  1   Living  2     SAB  1   TAB      Ectopic      Multiple      Live Births               Home Medications    Prior to Admission medications   Medication Sig Start Date End Date Taking? Authorizing Provider  acetaminophen (TYLENOL) 500 MG tablet Take 500 mg by mouth every 6 (six) hours as needed for mild pain, moderate pain or headache.     [provider]  Biotin 1000 MCG tablet Take 1,000 mcg by mouth daily.     [provider]  Calcium Carbonate-Vitamin D (CALCIUM 600+D) 600-400 MG-UNIT tablet Take 1 tablet by mouth.     [provider]  estradiol (CLIMARA - DOSED  IN MG/24 HR) 0.1 mg/24hr Place 1 patch onto the skin every Saturday.     [provider]  fluticasone (FLONASE) 50 MCG/ACT nasal spray Place 2 sprays into the nose daily as needed for rhinitis.     [provider]  HYDROcodone-acetaminophen (NORCO/VICODIN) 5-325 MG tablet One tablet every four hours as needed for acute pain.  Limit of five days per [place] statue. 12/17/18   Sanjuana Kava, MD  levothyroxine (SYNTHROID, LEVOTHROID) 75 MCG tablet Take 75 mcg by mouth at bedtime.     [provider]  LORazepam (ATIVAN) 0.5 MG tablet Take 1 tablet (0.5 mg total) by mouth 2 (two) times daily as needed for anxiety. 08/14/14   Rogene Houston, MD  LORazepam (ATIVAN) 1 MG tablet  04/04/19   [provider]  lovastatin (MEVACOR) 20 MG tablet  12/10/17   [provider]  Naproxen Sodium (ALEVE PO) Take by mouth as needed.    [provider]  oxymetazoline (AFRIN) 0.05 % nasal spray Place 1 spray into both nostrils 2 (two) times daily as needed for congestion.    [provider]  sertraline (ZOLOFT) 50 MG tablet Take 25 mg by mouth at bedtime.     [provider]    Family History Family History  Problem Relation Age of Onset  . Diabetes Mother   . Hypertension Mother   . Alcohol abuse Brother   . Cancer Daughter        breast  . Hodgkin's lymphoma Daughter   . Hypertension Daughter   . Heart disease Daughter   . Diabetes Maternal Grandfather   . Heart disease Maternal Grandfather   . Other Paternal Grandfather        poor circulation    Social History Social History   Tobacco Use  . Smoking status: Former Smoker    Packs/day: 0.50    Years: 10.00    Pack years: 5.00    Types: Cigarettes    Quit date: 07/09/1984    Years since quitting: 34.8  . Smokeless tobacco: Never Used  Substance Use Topics  . Alcohol use: Yes    Alcohol/week: 0.0 standard drinks    Comment: "once in a while"  . Drug use: No     Allergies   Codeine, Molds & smuts, and Zocor [simvastatin]   Review of Systems Review of Systems  Constitutional: Negative for fever.  Respiratory: Negative for shortness of breath.   Cardiovascular: Negative for chest pain.  Gastrointestinal: Positive for abdominal pain. Negative for constipation and diarrhea.  Genitourinary: Positive for flank pain and hematuria.  All other systems reviewed and are negative.    Physical Exam Updated Vital Signs BP (!) 151/84 (BP Location: Left Arm)   Pulse 79   Temp 97.6 F (36.4 C) (Oral)   Wt 58.5 kg   SpO2 100%   BMI 22.85 kg/m   Physical Exam CONSTITUTIONAL: Well developed/well nourished, anxious and in pain HEAD: Normocephalic/atraumatic EYES: EOMI ENMT: Mucous  membranes moist NECK: supple no meningeal signs SPINE/BACK:entire spine nontender CV: S1/S2 noted, no murmurs/rubs/gallops noted LUNGS: Lungs are clear to auscultation bilaterally, no apparent distress ABDOMEN: soft, nontender, no rebound or guarding, bowel sounds noted throughout abdomen GU: Left cva tenderness NEURO: Pt is awake/alert/appropriate, moves all extremitiesx4.  No facial droop.   EXTREMITIES: pulses normal/equal, full ROM SKIN: warm, color normal PSYCH: Anxious  ED Treatments / Results  Labs (all labs ordered are listed, but only abnormal results are displayed) Labs  Reviewed  BASIC METABOLIC PANEL - Abnormal; Notable for the following components:      Result Value   Glucose, Bld 137 (*)    BUN 26 (*)    All other components within normal limits  URINALYSIS, ROUTINE W REFLEX MICROSCOPIC - Abnormal; Notable for the following components:   APPearance CLOUDY (*)    Hgb urine dipstick MODERATE (*)    RBC / HPF >50 (*)    All other components within normal limits  CBC WITH DIFFERENTIAL/PLATELET    EKG None  Radiology No results found.  Procedures Procedures   Medications Ordered in ED Medications  ondansetron (ZOFRAN) injection 4 mg (4 mg Intravenous Given 05/20/19 0249)  fentaNYL (SUBLIMAZE) injection 100 mcg (100 mcg Intravenous Given 05/20/19 0250)  fentaNYL (SUBLIMAZE) injection 100 mcg (100 mcg Intravenous Given 05/20/19 0318)  ondansetron (ZOFRAN) injection 4 mg (4 mg Intravenous Given 05/20/19 0318)  ketorolac (TORADOL) 30 MG/ML injection 15 mg (15 mg Intravenous Given 05/20/19 0501)     Initial Impression / Assessment and Plan / ED Course  I have reviewed the triage vital signs and the nursing notes.  Pertinent labs & imaging results that were available during my care of the patient were reviewed by me and considered in my medical decision making (see chart for details).        3:29 AM Patient has story likely consistent with ureteral [place].   IV pain medicine ordered.  She will require CT imaging. 5:36 AM Patient found to have a 4 mm ureteral [place].  She is now feeling improved.  No signs of any infection.  She is now taking p.o. fluids.  She appears comfortable.  She feels comfortable for discharge home. Will refer to local urology.  She already has Vicodin at home.  Will add on Zofran We discussed strict return precautions Final Clinical Impressions(s) / ED Diagnoses   Final diagnoses:  Flank pain  Ureteral [place] with hydronephrosis    ED Discharge Orders         Ordered    ondansetron (ZOFRAN ODT) 8 MG disintegrating tablet  Every 8 hours PRN     05/20/19 [id number]           Chundra Sauerwein, MD 05/20/19 [phone]

## 2019-05-26 ENCOUNTER — Ambulatory Visit (HOSPITAL_COMMUNITY): Payer: Medicare Other

## 2019-05-26 ENCOUNTER — Telehealth: Payer: Self-pay | Admitting: Obstetrics & Gynecology

## 2019-05-26 NOTE — Telephone Encounter (Signed)

## 2019-05-27 ENCOUNTER — Other Ambulatory Visit: Payer: Self-pay

## 2019-05-27 ENCOUNTER — Encounter: Payer: Self-pay | Admitting: Obstetrics & Gynecology

## 2019-05-27 ENCOUNTER — Ambulatory Visit (INDEPENDENT_AMBULATORY_CARE_PROVIDER_SITE_OTHER): Payer: 59 | Admitting: Obstetrics & Gynecology

## 2019-05-27 VITALS — BP 136/80 | HR 83 | Ht 63.0 in | Wt 132.0 lb

## 2019-05-27 DIAGNOSIS — Z20822 Contact with and (suspected) exposure to covid-19: Secondary | ICD-10-CM

## 2019-05-27 DIAGNOSIS — Z4689 Encounter for fitting and adjustment of other specified devices: Secondary | ICD-10-CM

## 2019-05-27 NOTE — Progress Notes (Signed)
Chief Complaint  Patient presents with  . Pessary Check    Blood pressure 136/80, pulse 83, height 5\' 3"  (1.6 m), weight 132 lb (59.9 kg).  Anne Davenport presents today for routine follow up related to her pessary.   She uses a Milex ring with support #2 She reports no vaginal discharge or vaginal bleeding.  Exam reveals no undue vaginal mucosal pressure of breakdown, no discharge and no vaginal bleeding.  The pessary has been  removed,   Durene Romans will be sen back in prn months for continued follow up.  Florian Buff, MD  05/27/2019 12:01 PM

## 2019-05-29 ENCOUNTER — Ambulatory Visit (HOSPITAL_COMMUNITY)
Admission: RE | Admit: 2019-05-29 | Discharge: 2019-05-29 | Disposition: A | Discharge status: Home / Self Care | Payer: 59 | Source: Ambulatory Visit | Attending: Family Medicine | Admitting: Family Medicine

## 2019-05-29 ENCOUNTER — Other Ambulatory Visit: Payer: Self-pay

## 2019-05-29 ENCOUNTER — Ambulatory Visit (HOSPITAL_COMMUNITY): Payer: Medicare Other

## 2019-05-29 ENCOUNTER — Ambulatory Visit: Payer: Medicare Other | Admitting: Orthopaedic Surgery

## 2019-05-29 DIAGNOSIS — Z1231 Encounter for screening mammogram for malignant neoplasm of breast: Secondary | ICD-10-CM | POA: Diagnosis present

## 2019-05-29 LAB — NOVEL CORONAVIRUS, NAA: SARS-CoV-2, NAA: NOT DETECTED

## 2019-05-30 ENCOUNTER — Telehealth: Payer: Self-pay | Admitting: Family Medicine

## 2019-05-30 NOTE — Telephone Encounter (Signed)
Patient is calling to receive her negative COVID test results. Patient expressed understanding. 

## 2019-06-03 ENCOUNTER — Ambulatory Visit: Payer: 59

## 2019-06-03 ENCOUNTER — Ambulatory Visit (INDEPENDENT_AMBULATORY_CARE_PROVIDER_SITE_OTHER): Payer: Medicare Other | Admitting: Orthopaedic Surgery

## 2019-06-03 ENCOUNTER — Encounter: Payer: Self-pay | Admitting: Orthopaedic Surgery

## 2019-06-03 ENCOUNTER — Other Ambulatory Visit: Payer: Self-pay

## 2019-06-03 VITALS — BP 149/84 | HR 75 | Temp 97.2°F | Ht 63.0 in

## 2019-06-03 DIAGNOSIS — M25552 Pain in left hip: Secondary | ICD-10-CM | POA: Diagnosis not present

## 2019-06-03 DIAGNOSIS — S92351D Displaced fracture of fifth metatarsal bone, right foot, subsequent encounter for fracture with routine healing: Secondary | ICD-10-CM

## 2019-06-03 DIAGNOSIS — S92352D Displaced fracture of fifth metatarsal bone, left foot, subsequent encounter for fracture with routine healing: Secondary | ICD-10-CM | POA: Diagnosis not present

## 2019-06-03 DIAGNOSIS — S92352A Displaced fracture of fifth metatarsal bone, left foot, initial encounter for closed fracture: Secondary | ICD-10-CM

## 2019-06-03 NOTE — Patient Instructions (Signed)
Use Aspercreme, Biofreeze or Voltaren gel over the counter 2-3 times daily make sure you rub it in well each time you use it.   Ice your hip before bed and you can start to work out of the boot, as tolerated

## 2019-06-03 NOTE — Progress Notes (Signed)
Patient WU:JWJX:Anne Davenport, female DOB:[birth year], 78 y.o. BJY:[id number]  Chief Complaint  Patient presents with  . Foot Injury    Right foot fracture    HPI  Anne Davenport is a 78 y.o. female who has healing fracture of the left foot.  She also has recurrent trochanteric pain of the left hip.  She has no trauma.  Injections of the bursae only help a short time.  She is using the CAM walker.   Body mass index is 23.38 kg/m.  ROS  Review of Systems  Respiratory: Negative for cough and shortness of breath.   Cardiovascular: Negative for chest pain.  Endocrine: Positive for cold intolerance.  Musculoskeletal: Positive for arthralgias, back pain, gait problem and joint swelling.  Allergic/Immunologic: Positive for environmental allergies.  Psychiatric/Behavioral: The patient is nervous/anxious.   All other systems reviewed and are negative.   All other systems reviewed and are negative.  The following is a summary of the past history medically, past history surgically, known current medicines, social history and family history.  This information is gathered electronically by the computer from prior information and documentation.  I review this each visit and have found including this information at this point in the chart is beneficial and informative.    Past Medical History:  Diagnosis Date  . Anxiety   . Back pain    mid back  . Bartholin cyst 05/20/2015  . Family history of adverse reaction to anesthesia    Daughter stopped breathing with Ativan  . 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 Hyperlipemia   . Hypertension   . IBS (irritable bowel syndrome)   . 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 Screening for STD (sexually transmitted disease) 07/23/2015    Past Surgical History:  Procedure Laterality Date  . ABDOMINAL HYSTERECTOMY    . BLADDER REPAIR    . COLONOSCOPY N/A 06/24/2015   Procedure: COLONOSCOPY;  Surgeon: Malissa HippoNajeeb  U Rehman, MD;  Location: [facility];  Service: Endoscopy;  Laterality: N/A;  730  . ESOPHAGOGASTRODUODENOSCOPY N/A 08/14/2014   Procedure: ESOPHAGOGASTRODUODENOSCOPY (EGD);  Surgeon: Malissa HippoNajeeb U Rehman, MD;  Location: [facility];  Service: Endoscopy;  Laterality: N/A;  1:10  . TONSILLECTOMY    . WRIST SURGERY Left     Family History  Problem Relation Age of Onset  . Diabetes Mother   . Hypertension Mother   . Alcohol abuse Brother   . Cancer Daughter        breast  . Hodgkin's lymphoma Daughter   . Hypertension Daughter   . Heart disease Daughter   . Diabetes Maternal Grandfather   . Heart disease Maternal Grandfather   . Other Paternal Grandfather        poor circulation    Social History Social History   Tobacco Use  . Smoking status: Former Smoker    Packs/day: 0.50    Years: 10.00    Pack years: 5.00    Types: Cigarettes    Quit date: 07/09/1984    Years since quitting: 34.9  . Smokeless tobacco: Never Used  Substance Use Topics  . Alcohol use: Yes    Alcohol/week: 0.0 standard drinks    Comment: "once in a while"  . Drug use: No    Allergies  Allergen Reactions  . Codeine Itching and Nausea And Vomiting  . Molds & Smuts Other (See Comments)    Reaction:  Itchy, watery eyes   . Zocor [Simvastatin] Nausea And  Vomiting and Rash    Current Outpatient Medications  Medication Sig Dispense Refill  . acetaminophen (TYLENOL) 500 MG tablet Take 500 mg by mouth every 6 (six) hours as needed for mild pain, moderate pain or headache.     . Biotin 1000 MCG tablet Take 1,000 mcg by mouth daily.     . Calcium Carbonate-Vitamin D (CALCIUM 600+D) 600-400 MG-UNIT tablet Take 1 tablet by mouth.     . estradiol (CLIMARA - DOSED IN MG/24 HR) 0.1 mg/24hr Place 1 patch onto the skin every Saturday.     . fluticasone (FLONASE) 50 MCG/ACT nasal spray Place 2 sprays into the nose daily as needed for rhinitis.     Marland Kitchen HYDROcodone-acetaminophen (NORCO/VICODIN) 5-325 MG tablet One  tablet every four hours as needed for acute pain.  Limit of five days per [place]. 30 tablet 0  . levothyroxine (SYNTHROID, LEVOTHROID) 75 MCG tablet Take 75 mcg by mouth at bedtime.     Marland Kitchen LORazepam (ATIVAN) 0.5 MG tablet Take 1 tablet (0.5 mg total) by mouth 2 (two) times daily as needed for anxiety. 60 tablet 0  . LORazepam (ATIVAN) 1 MG tablet     . lovastatin (MEVACOR) 20 MG tablet     . Naproxen Sodium (ALEVE PO) Take by mouth as needed.    . ondansetron (ZOFRAN ODT) 8 MG disintegrating tablet Take 1 tablet (8 mg total) by mouth every 8 (eight) hours as needed. 8 tablet 0  . oxymetazoline (AFRIN) 0.05 % nasal spray Place 1 spray into both nostrils 2 (two) times daily as needed for congestion.    Marland Kitchen PERCOCET 5-325 MG tablet Take 1-2 tablets by mouth every 6 (six) hours as needed.    . sertraline (ZOLOFT) 50 MG tablet Take 25 mg by mouth at bedtime.     . tamsulosin (FLOMAX) 0.4 MG CAPS capsule Take 0.4 mg by mouth daily.     No current facility-administered medications for this visit.      Physical Exam  Blood pressure (!) 149/84, pulse 75, temperature (!) 97.2 F (36.2 C), height 5\' 3"  (1.6 m).  Constitutional: overall normal hygiene, normal nutrition, well developed, normal grooming, normal body habitus. Assistive device:CAM walker  Musculoskeletal: gait and station Limp left, muscle tone and strength are normal, no tremors or atrophy is present.  .  Neurological: coordination overall normal.  Deep tendon reflex/nerve stretch intact.  Sensation normal.  Cranial nerves II-XII intact.   Skin:   Normal overall no scars, lesions, ulcers or rashes. No psoriasis.  Psychiatric: Alert and oriented x 3.  Recent memory intact, remote memory unclear.  Normal mood and affect. Well groomed.  Good eye contact.  Cardiovascular: overall no swelling, no varicosities, no edema bilaterally, normal temperatures of the legs and arms, no clubbing, cyanosis and good capillary  refill.  Lymphatic: palpation is normal.  Left hip is tender over the trochanteric area but no redness.  ROM full.  NV intact.  Left foot slightly tender over the base of the fifth metatarsal.  NV intact.  No redness, no swelling.  All other systems reviewed and are negative   The patient has been educated about the nature of the problem(s) and counseled on treatment options.  The patient appeared to understand what I have discussed and is in agreement with it.  Encounter Diagnoses  Name Primary?  . Closed fracture of base of fifth metatarsal bone of right foot with routine healing Yes  . Pain in left hip  X-rays were done of the left hip, reported separately.  PLAN Call if any problems.  Precautions discussed.  Continue current medications.   Return to clinic 1 month   Electronically Signed Sanjuana Kava, MD 11/24/202011:18 AM

## 2019-06-17 ENCOUNTER — Other Ambulatory Visit (HOSPITAL_COMMUNITY)
Admission: RE | Admit: 2019-06-17 | Discharge: 2019-06-17 | Disposition: A | Discharge status: Home / Self Care | Payer: Medicare Other | Source: Ambulatory Visit | Attending: Urology | Admitting: Urology

## 2019-06-17 DIAGNOSIS — N201 Calculus of ureter: Secondary | ICD-10-CM | POA: Diagnosis present

## 2019-06-24 LAB — CALCULI, WITH PHOTOGRAPH (CLINICAL LAB)
Calcium Oxalate Dihydrate: 70 %
Calcium Oxalate Monohydrate: 30 %
Weight Calculi: 16 mg

## 2019-06-30 ENCOUNTER — Ambulatory Visit: Payer: Medicare Other | Admitting: Obstetrics & Gynecology

## 2019-07-01 ENCOUNTER — Ambulatory Visit (INDEPENDENT_AMBULATORY_CARE_PROVIDER_SITE_OTHER): Payer: Medicare Other | Admitting: Orthopaedic Surgery

## 2019-07-01 ENCOUNTER — Ambulatory Visit: Payer: Medicare Other | Admitting: Obstetrics & Gynecology

## 2019-07-01 ENCOUNTER — Ambulatory Visit: Payer: Medicare Other

## 2019-07-01 ENCOUNTER — Encounter: Payer: Self-pay | Admitting: Orthopaedic Surgery

## 2019-07-01 ENCOUNTER — Other Ambulatory Visit: Payer: Self-pay

## 2019-07-01 DIAGNOSIS — S92352D Displaced fracture of fifth metatarsal bone, left foot, subsequent encounter for fracture with routine healing: Secondary | ICD-10-CM

## 2019-07-01 DIAGNOSIS — S92351D Displaced fracture of fifth metatarsal bone, right foot, subsequent encounter for fracture with routine healing: Secondary | ICD-10-CM

## 2019-07-01 NOTE — Progress Notes (Signed)
My foot is better.  She has no pain of the left foot now.  Gait is normal.   X-rays were done, reported separately, of the left foot.  Encounter Diagnosis  Name Primary?  . Closed fracture of base of fifth metatarsal bone of left foot with routine healing Yes   I will discharge for this.  Call if any problem.  Precautions discussed.   Electronically Signed Sanjuana Kava, MD 12/22/20202:30 PM

## 2019-07-19 ENCOUNTER — Ambulatory Visit
Admission: EM | Admit: 2019-07-19 | Discharge: 2019-07-19 | Disposition: A | Discharge status: Home / Self Care | Payer: Medicare Other

## 2019-07-19 ENCOUNTER — Other Ambulatory Visit: Payer: Self-pay

## 2019-07-19 NOTE — ED Triage Notes (Signed)
Pt presents to UC w/ c/o frontal left knee pain x1 month. Pt states 4 days ago, the medial side of her left knee began to hurt. Pt states she is worried about blood clot. No redness or swelling at site.  Pt had a fall 3 months ago where she injured her left foot.

## 2019-07-29 ENCOUNTER — Other Ambulatory Visit: Payer: Self-pay

## 2019-07-29 ENCOUNTER — Ambulatory Visit (INDEPENDENT_AMBULATORY_CARE_PROVIDER_SITE_OTHER): Payer: Medicare Other | Admitting: Orthopaedic Surgery

## 2019-07-29 ENCOUNTER — Encounter: Payer: Self-pay | Admitting: Orthopaedic Surgery

## 2019-07-29 DIAGNOSIS — M25511 Pain in right shoulder: Secondary | ICD-10-CM | POA: Diagnosis not present

## 2019-07-29 DIAGNOSIS — G8929 Other chronic pain: Secondary | ICD-10-CM

## 2019-07-29 MED ORDER — PREDNISONE 5 MG (21) PO TBPK: ORAL_TABLET | ORAL | 0 refills | Status: DC

## 2019-07-29 NOTE — Addendum Note (Signed)
Addended by: Earnstine Regal on: 07/29/2019 01:53 PM   Modules accepted: Orders

## 2019-07-29 NOTE — Progress Notes (Signed)
PROCEDURE NOTE:  The patient request injection, verbal consent was obtained.  The right shoulder was prepped appropriately after time out was performed.   Sterile technique was observed and injection of 1 cc of Depo-Medrol 40 mg with several cc's of plain xylocaine. Anesthesia was provided by ethyl chloride and a 20-gauge needle was used to inject the shoulder area. A posterior approach was used.  The injection was tolerated well.  A band aid dressing was applied.  The patient was advised to apply ice later today and tomorrow to the injection sight as needed.  See as needed.  Call if any problem.  Precautions discussed.   Electronically Signed Darreld Mclean, MD 1/19/20211:37 PM

## 2019-08-15 ENCOUNTER — Other Ambulatory Visit (HOSPITAL_COMMUNITY): Payer: Self-pay | Admitting: Nurse Practitioner

## 2019-08-15 DIAGNOSIS — Z780 Asymptomatic menopausal state: Secondary | ICD-10-CM

## 2019-09-11 ENCOUNTER — Other Ambulatory Visit: Payer: Self-pay

## 2019-09-11 ENCOUNTER — Ambulatory Visit: Payer: Medicare Other | Attending: Internal Medicine

## 2019-09-11 DIAGNOSIS — Z20822 Contact with and (suspected) exposure to covid-19: Secondary | ICD-10-CM

## 2019-09-12 ENCOUNTER — Telehealth: Payer: Self-pay | Admitting: *Deleted

## 2019-09-12 LAB — NOVEL CORONAVIRUS, NAA: SARS-CoV-2, NAA: DETECTED — AB

## 2019-09-12 NOTE — Telephone Encounter (Signed)
Pt advised that her covid 19 test result are not available at this time. She voiced understanding.

## 2019-09-13 ENCOUNTER — Other Ambulatory Visit: Payer: Self-pay | Admitting: Physician Assistant

## 2019-09-13 DIAGNOSIS — E1140 Type 2 diabetes mellitus with diabetic neuropathy, unspecified: Secondary | ICD-10-CM

## 2019-09-13 DIAGNOSIS — U071 COVID-19: Secondary | ICD-10-CM

## 2019-09-13 MED ORDER — SODIUM CHLORIDE 0.9 % IV SOLN
700.0000 mg | Freq: Once | INTRAVENOUS | Status: AC
  Administered 2019-09-14: 700 mg via INTRAVENOUS
  Filled 2019-09-13: qty 700

## 2019-09-13 NOTE — Progress Notes (Signed)
  I connected by phone with Anne Davenport on 09/13/2019 at 9:16 AM to discuss the potential use of an new treatment for mild to moderate COVID-19 viral infection in non-hospitalized patients.  This patient is a 79 y.o. female that meets the FDA criteria for Emergency Use Authorization of bamlanivimab or casirivimab\imdevimab.  Has a (+) direct SARS-CoV-2 viral test result  Has mild or moderate COVID-19   Is ? 79 years of age and weighs ? 40 kg  Is NOT hospitalized due to COVID-19  Is NOT requiring oxygen therapy or requiring an increase in baseline oxygen flow rate due to COVID-19  Is within 10 days of symptom onset  Has at least one of the high risk factor(s) for progression to severe COVID-19 and/or hospitalization as defined in EUA.  Specific high risk criteria : >/= 79 yo with diabetes.    I have spoken and communicated the following to the patient or parent/caregiver:  1. FDA has authorized the emergency use of bamlanivimab and casirivimab\imdevimab for the treatment of mild to moderate COVID-19 in adults and pediatric patients with positive results of direct SARS-CoV-2 viral testing who are 63 years of age and older weighing at least 40 kg, and who are at high risk for progressing to severe COVID-19 and/or hospitalization.  2. The significant known and potential risks and benefits of bamlanivimab and casirivimab\imdevimab, and the extent to which such potential risks and benefits are unknown.  3. Information on available alternative treatments and the risks and benefits of those alternatives, including clinical trials.  4. Patients treated with bamlanivimab and casirivimab\imdevimab should continue to self-isolate and use infection control measures (e.g., wear mask, isolate, social distance, avoid sharing personal items, clean and disinfect "high touch" surfaces, and frequent handwashing) according to CDC guidelines.   5. The patient or parent/caregiver has the option to accept or  refuse bamlanivimab or casirivimab\imdevimab .  After reviewing this information with the patient, The patient agreed to proceed with receiving the bamlanimivab infusion and will be provided a copy of the Fact sheet prior to receiving the infusion.   Sx onset 2/27. Set up for infusion 09/14/19 @ 10:30am.   Cline Crock 09/13/2019 9:16 AM

## 2019-09-14 ENCOUNTER — Ambulatory Visit (HOSPITAL_COMMUNITY)
Admission: RE | Admit: 2019-09-14 | Discharge: 2019-09-14 | Disposition: A | Discharge status: Home / Self Care | Payer: Medicare Other | Source: Ambulatory Visit | Attending: Pulmonary Disease | Admitting: Pulmonary Disease

## 2019-09-14 DIAGNOSIS — Z23 Encounter for immunization: Secondary | ICD-10-CM | POA: Insufficient documentation

## 2019-09-14 DIAGNOSIS — E1140 Type 2 diabetes mellitus with diabetic neuropathy, unspecified: Secondary | ICD-10-CM | POA: Diagnosis present

## 2019-09-14 DIAGNOSIS — U071 COVID-19: Secondary | ICD-10-CM | POA: Diagnosis present

## 2019-09-14 MED ORDER — EPINEPHRINE 0.3 MG/0.3ML IJ SOAJ: 0.3000 mg | Freq: Once | INTRAMUSCULAR | Status: DC | PRN

## 2019-09-14 MED ORDER — ALBUTEROL SULFATE HFA 108 (90 BASE) MCG/ACT IN AERS: 2.0000 | INHALATION_SPRAY | Freq: Once | RESPIRATORY_TRACT | Status: DC | PRN

## 2019-09-14 MED ORDER — FAMOTIDINE IN NACL 20-0.9 MG/50ML-% IV SOLN: 20.0000 mg | Freq: Once | INTRAVENOUS | Status: DC | PRN

## 2019-09-14 MED ORDER — SODIUM CHLORIDE 0.9 % IV SOLN: INTRAVENOUS | Status: DC | PRN

## 2019-09-14 MED ORDER — DIPHENHYDRAMINE HCL 50 MG/ML IJ SOLN: 50.0000 mg | Freq: Once | INTRAMUSCULAR | Status: DC | PRN

## 2019-09-14 MED ORDER — METHYLPREDNISOLONE SODIUM SUCC 125 MG IJ SOLR: 125.0000 mg | Freq: Once | INTRAMUSCULAR | Status: DC | PRN

## 2019-09-14 NOTE — Progress Notes (Signed)
  Diagnosis: COVID-19  Physician:Dr Wright  Procedure: [facility] Med: bamlanivimab infusion - Provided patient with bamlanimivab fact sheet for patients, parents and caregivers prior to infusion.  Complications: No immediate complications noted.  Discharge: Discharged home   Liesl Simons W 09/14/2019  

## 2019-09-14 NOTE — Discharge Instructions (Signed)

## 2019-10-03 ENCOUNTER — Other Ambulatory Visit (HOSPITAL_COMMUNITY): Payer: Self-pay | Admitting: Family Medicine

## 2019-10-03 DIAGNOSIS — M899 Disorder of bone, unspecified: Secondary | ICD-10-CM

## 2019-10-03 DIAGNOSIS — E2839 Other primary ovarian failure: Secondary | ICD-10-CM

## 2019-10-03 DIAGNOSIS — Z780 Asymptomatic menopausal state: Secondary | ICD-10-CM

## 2019-10-15 ENCOUNTER — Ambulatory Visit (HOSPITAL_COMMUNITY)
Admission: RE | Admit: 2019-10-15 | Discharge: 2019-10-15 | Disposition: A | Discharge status: Home / Self Care | Payer: Medicare Other | Source: Ambulatory Visit | Attending: Family Medicine | Admitting: Family Medicine

## 2019-10-15 ENCOUNTER — Other Ambulatory Visit: Payer: Self-pay

## 2019-10-15 DIAGNOSIS — E2839 Other primary ovarian failure: Secondary | ICD-10-CM | POA: Diagnosis not present

## 2019-10-23 ENCOUNTER — Ambulatory Visit (INDEPENDENT_AMBULATORY_CARE_PROVIDER_SITE_OTHER): Payer: Medicare Other | Admitting: Adult Health

## 2019-10-23 ENCOUNTER — Other Ambulatory Visit: Payer: Self-pay

## 2019-10-23 ENCOUNTER — Encounter: Payer: Self-pay | Admitting: Adult Health

## 2019-10-23 VITALS — BP 139/68 | HR 64 | Ht 63.0 in | Wt 131.0 lb

## 2019-10-23 DIAGNOSIS — B373 Candidiasis of vulva and vagina: Secondary | ICD-10-CM | POA: Diagnosis not present

## 2019-10-23 DIAGNOSIS — R1031 Right lower quadrant pain: Secondary | ICD-10-CM | POA: Diagnosis not present

## 2019-10-23 DIAGNOSIS — R3989 Other symptoms and signs involving the genitourinary system: Secondary | ICD-10-CM | POA: Diagnosis not present

## 2019-10-23 DIAGNOSIS — B3731 Acute candidiasis of vulva and vagina: Secondary | ICD-10-CM | POA: Insufficient documentation

## 2019-10-23 DIAGNOSIS — N898 Other specified noninflammatory disorders of vagina: Secondary | ICD-10-CM | POA: Insufficient documentation

## 2019-10-23 DIAGNOSIS — N993 Prolapse of vaginal vault after hysterectomy: Secondary | ICD-10-CM

## 2019-10-23 LAB — POCT WET PREP (WET MOUNT): WBC, Wet Prep HPF POC: POSITIVE

## 2019-10-23 LAB — POCT URINALYSIS DIPSTICK
Blood, UA: NEGATIVE
Glucose, UA: NEGATIVE
Ketones, UA: NEGATIVE
Leukocytes, UA: NEGATIVE
Nitrite, UA: NEGATIVE
Protein, UA: NEGATIVE

## 2019-10-23 MED ORDER — FLUCONAZOLE 150 MG PO TABS: ORAL_TABLET | ORAL | 1 refills | Status: DC

## 2019-10-23 MED ORDER — NYSTATIN 100000 UNIT/GM EX OINT: 1.0000 "application " | TOPICAL_OINTMENT | Freq: Two times a day (BID) | CUTANEOUS | 0 refills | Status: DC

## 2019-10-23 NOTE — Progress Notes (Signed)
  Subjective:     Patient ID: Anne Davenport, female   DOB: Jul 05, 1941, 79 y.o.   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 She took AZO.Her pessary has been out about 6 months.  PCP is Dr Sudie Bailey.   Review of Systems Has bladder pain she says  +vaginal irritation +vaginal discharge Not sexually active  Reviewed past medical,surgical, social and family history. Reviewed medications and allergies.     Objective:   Physical Exam BP 139/68 (BP Location: Left Arm, Patient Position: Sitting, Cuff Size: Normal)   Pulse 64   Ht 5\' 3"  (1.6 m)   Wt 131 lb (59.4 kg)   BMI 23.21 kg/m urine dipstick negative. Skin warm and dry.Pelvic: external genitalia is normal in appearance no lesions, vagina: white discharge without odor, irritated at introitus,urethra has no lesions or masses noted, cervix and uterus are absent and has vaginal prolapse, adnexa: no masses, +RLQ tenderness noted. Bladder is tender and no masses felt. Wet prep: + yeast and WBCs Examination chaperoned by LPN    Assessment:     1. Bladder pain  2. Vaginal irritation Rx nystatin ointment for area at introitus   3. Yeast vaginitis Rx diflucan,don't take cholesterol on day takes diflucan  Meds ordered this encounter  Medications  . fluconazole (DIFLUCAN) 150 MG tablet    Sig: Take 1 now and repeat 1 in 3 days    Dispense:  2 tablet    Refill:  1    Order Specific Question:   Supervising Provider    Answer:   EURE, LUTHER H [[id number]]  . nystatin ointment (MYCOSTATIN)    Sig: Apply 1 application topically 2 (two) times daily.    Dispense:  30 g    Refill:  0    Order Specific Question:   Supervising Provider    Answer:   Stoney Bang, LUTHER H [[id number]]    4. RLQ abdominal pain Will get GYN Despina Hidden in about 12 days   5. 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

## 2019-11-03 ENCOUNTER — Other Ambulatory Visit: Payer: Medicare Other

## 2019-11-27 ENCOUNTER — Encounter: Payer: Self-pay | Admitting: Orthopaedic Surgery

## 2019-11-27 ENCOUNTER — Other Ambulatory Visit: Payer: Self-pay

## 2019-11-27 ENCOUNTER — Ambulatory Visit (INDEPENDENT_AMBULATORY_CARE_PROVIDER_SITE_OTHER): Payer: Medicare Other | Admitting: Orthopaedic Surgery

## 2019-11-27 VITALS — Ht 63.0 in | Wt 133.0 lb

## 2019-11-27 DIAGNOSIS — M25511 Pain in right shoulder: Secondary | ICD-10-CM | POA: Diagnosis not present

## 2019-11-27 DIAGNOSIS — G8929 Other chronic pain: Secondary | ICD-10-CM | POA: Diagnosis not present

## 2019-11-27 NOTE — Progress Notes (Signed)
PROCEDURE NOTE:  The patient request injection, verbal consent was obtained.  The right shoulder was prepped appropriately after time out was performed.   Sterile technique was observed and injection of 1 cc of Depo-Medrol 40 mg with several cc's of plain xylocaine. Anesthesia was provided by ethyl chloride and a 20-gauge needle was used to inject the shoulder area. A posterior approach was used.  The injection was tolerated well.  A band aid dressing was applied.  The patient was advised to apply ice later today and tomorrow to the injection sight as needed.  See as needed.  Electronically Signed Darreld Mclean, MD 5/20/202110:50 AM

## 2019-12-22 ENCOUNTER — Other Ambulatory Visit: Payer: Self-pay

## 2019-12-22 ENCOUNTER — Emergency Department (HOSPITAL_COMMUNITY)
Admission: EM | Admit: 2019-12-22 | Discharge: 2019-12-22 | Disposition: A | Discharge status: Home / Self Care | Payer: Medicare Other | Attending: Emergency Medicine | Admitting: Emergency Medicine

## 2019-12-22 ENCOUNTER — Emergency Department (HOSPITAL_COMMUNITY): Payer: Medicare Other

## 2019-12-22 ENCOUNTER — Ambulatory Visit
Admission: EM | Admit: 2019-12-22 | Discharge: 2019-12-22 | Disposition: A | Discharge status: Home / Self Care | Payer: Medicare Other | Source: Home / Self Care

## 2019-12-22 ENCOUNTER — Encounter (HOSPITAL_COMMUNITY): Payer: Self-pay | Admitting: *Deleted

## 2019-12-22 DIAGNOSIS — S0990XA Unspecified injury of head, initial encounter: Secondary | ICD-10-CM | POA: Diagnosis present

## 2019-12-22 DIAGNOSIS — Y929 Unspecified place or not applicable: Secondary | ICD-10-CM | POA: Insufficient documentation

## 2019-12-22 DIAGNOSIS — W228XXA Striking against or struck by other objects, initial encounter: Secondary | ICD-10-CM | POA: Diagnosis not present

## 2019-12-22 DIAGNOSIS — Z23 Encounter for immunization: Secondary | ICD-10-CM | POA: Diagnosis not present

## 2019-12-22 DIAGNOSIS — I10 Essential (primary) hypertension: Secondary | ICD-10-CM | POA: Diagnosis not present

## 2019-12-22 DIAGNOSIS — Y939 Activity, unspecified: Secondary | ICD-10-CM | POA: Diagnosis not present

## 2019-12-22 DIAGNOSIS — Y999 Unspecified external cause status: Secondary | ICD-10-CM | POA: Diagnosis not present

## 2019-12-22 DIAGNOSIS — Z87891 Personal history of nicotine dependence: Secondary | ICD-10-CM | POA: Insufficient documentation

## 2019-12-22 DIAGNOSIS — S0101XA Laceration without foreign body of scalp, initial encounter: Secondary | ICD-10-CM

## 2019-12-22 MED ORDER — ACETAMINOPHEN 325 MG PO TABS
650.0000 mg | ORAL_TABLET | Freq: Once | ORAL | Status: AC
  Administered 2019-12-22: 650 mg via ORAL
  Filled 2019-12-22: qty 2

## 2019-12-22 MED ORDER — TETANUS-DIPHTH-ACELL PERTUSSIS 5-2.5-18.5 LF-MCG/0.5 IM SUSP
0.5000 mL | Freq: Once | INTRAMUSCULAR | Status: AC
  Administered 2019-12-22: 0.5 mL via INTRAMUSCULAR
  Filled 2019-12-22: qty 0.5

## 2019-12-22 NOTE — ED Triage Notes (Signed)
Pt hit top of head while lifting something out of a cabinet.  Small laceration to top of head with no active bleeding.  Pt does c/o dizziness and lightheadedness.  Denies taking any blood thinners.

## 2019-12-22 NOTE — ED Notes (Signed)
Pt to CT

## 2019-12-22 NOTE — ED Triage Notes (Signed)
Laceration to top of head, hit on cabinet at home

## 2019-12-22 NOTE — Discharge Instructions (Addendum)
You are seen today for a laceration on your head, when he got to the emergency department it was completely healed over.  You are given Tdap today as well.  You can use the guide attached above.  If you start having any worsening or new concerning symptoms please come back to the emergency department.  I want you to follow-up with an ophthalmologist due to your eye.  If you start having any worsening headache, worsening vision changes, weakness, vomiting, altered mental status please come back to the emergency department.

## 2019-12-22 NOTE — ED Provider Notes (Signed)
[facility] EMERGENCY DEPARTMENT Provider Note   CSN: [id number] Arrival date & time: 12/22/19  1140     History Chief Complaint  Patient presents with  . Head Laceration    KALEISHA BHARGAVA is a 79 y.o. female with pertinent past medical history of anxiety, hyperlipidemia, hypertension that presents emergency department today for head injury that occurred this morning.  Patient states that she was bending over to try to reach something from the cabinet and hit her head at the top of the cabinet.  States that she might of lost consciousness, only for couple seconds.  States that now she is complaining of a headache states that it is a 5/10 that is on her forehead.  Has not taken anything for this.  States that she also is experiencing some " fuzzy" vision in her right eye.  States it is not blurry, denies any diplopia.  States that it just feels different.  Was in normal health before this.  Denies any blood thinners.  States that there was a lot of blood initially coming from scalp, bleeding has been controlled.  Denies any numbness or paresthesias.  Denies any weakness, gait abnormality, previous head injury, neck pain, back pain, chest pain, shortness of breath..  Denies any alcohol use.  Went to urgent care first, who stated to come here for CT scan.  Is unsure when last Tdap was.  HPI     Past Medical History:  Diagnosis Date  . Anxiety   . Back pain    mid back  . Bartholin cyst 05/20/2015  . Family history of adverse reaction to anesthesia    Daughter stopped breathing with Ativan  . 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 Hyperlipemia   . Hypertension   . IBS (irritable bowel syndrome)   . 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 Screening for STD (sexually transmitted disease) 07/23/2015    Patient Active Problem List   Diagnosis Date Noted  . RLQ abdominal pain 10/23/2019  . Yeast vaginitis 10/23/2019  . Vaginal irritation  10/23/2019  . Bladder pain 10/23/2019  . Type 2 diabetes mellitus with diabetic neuropathy (HCC) 03/13/2019  . 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  . Right knee pain 08/18/2015  . Right shoulder pain 08/18/2015  . 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 Screening for STD (sexually transmitted disease) 07/23/2015  . Bartholin cyst 05/20/2015  . 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 Iatrogenic hypothyroidism 04/09/2015  . High cholesterol 09/07/2014    Past Surgical History:  Procedure Laterality Date  . ABDOMINAL HYSTERECTOMY    . BLADDER REPAIR    . COLONOSCOPY N/A 06/24/2015   Procedure: COLONOSCOPY;  Surgeon: Malissa Hippo, MD;  Location: [facility] END[facility] SUITE;  Service: Endoscopy;  Laterality: N/A;  730  . ESOPHAGOGASTRODUODENOSCOPY N/A 08/14/2014   Procedure: ESOPHAGOGASTRODUODENOSCOPY (EGD);  Surgeon: Malissa Hippo, MD;  Location: [facility] SUITE;  Service: Endoscopy;  Laterality: N/A;  1:10  . TONSILLECTOMY    . WRIST SURGERY Left      OB History    Gravida  4   Para  3   Term      Preterm      AB  1   Living  2     SAB  1   TAB      Ectopic      Multiple      Live Births  Family History  Problem Relation Age of Onset  . Diabetes Mother   . Hypertension Mother   . Alcohol abuse Brother   . Cancer Daughter        breast  . Hodgkin's lymphoma Daughter   . Hypertension Daughter   . Heart disease Daughter   . Diabetes Maternal Grandfather   . Heart disease Maternal Grandfather   . Other Paternal Grandfather        poor circulation    Social History   Tobacco Use  . Smoking status: Former Smoker    Packs/day: 0.50    Years: 10.00    Pack years: 5.00    Types: Cigarettes    Quit date: 07/09/1984    Years since quitting: 35.4  . Smokeless tobacco: Never Used  Vaping Use  . Vaping Use: Never used  Substance Use Topics  . Alcohol use: Yes    Alcohol/week: 0.0 standard drinks     Comment: "once in a while"  . Drug use: No    Home Medications Prior to Admission medications   Medication Sig Start Date End Date Taking? Authorizing Provider  acetaminophen (TYLENOL) 500 MG tablet Take 500 mg by mouth every 6 (six) hours as needed for mild pain, moderate pain or headache.    Yes [provider]  Calcium Carbonate-Vitamin D (CALCIUM 600+D) 600-400 MG-UNIT tablet Take 1 tablet by mouth.    Yes [provider]  estradiol (CLIMARA - DOSED IN MG/24 HR) 0.1 mg/24hr Place 1 patch onto the skin every Saturday.    Yes [provider]  fluticasone (FLONASE) 50 MCG/ACT nasal spray Place 2 sprays into the nose daily as needed for rhinitis.    Yes [provider]  HYDROcodone-acetaminophen (NORCO/VICODIN) 5-325 MG tablet One tablet every four hours as needed for acute pain.  Limit of five days per [place] statue. 12/17/18  Yes Sanjuana Kava, MD  levothyroxine (SYNTHROID, LEVOTHROID) 75 MCG tablet Take 75 mcg by mouth at bedtime.    Yes [provider]  LORazepam (ATIVAN) 0.5 MG tablet Take 1 tablet (0.5 mg total) by mouth 2 (two) times daily as needed for anxiety. Patient taking differently: Take 0.5 mg by mouth at bedtime.  08/14/14  Yes Rehman, Mechele Dawley, MD  lovastatin (MEVACOR) 20 MG tablet Take 20 mg by mouth at bedtime.  12/10/17  Yes [provider]  Naproxen Sodium (ALEVE PO) Take 1 tablet by mouth as needed (back pain).    Yes [provider]  nystatin ointment (MYCOSTATIN) Apply 1 application topically 2 (two) times daily. 10/23/19  Yes Estill Dooms, NP  oxymetazoline (AFRIN) 0.05 % nasal spray Place 1 spray into both nostrils 2 (two) times daily as needed for congestion.   Yes [provider]  sertraline (ZOLOFT) 50 MG tablet Take 25 mg by mouth at bedtime.    Yes [provider]  fluconazole (DIFLUCAN) 150 MG tablet Take 1 now and repeat 1 in 3 days Patient not taking: Reported on 11/27/2019  10/23/19   Estill Dooms, NP  ondansetron (ZOFRAN ODT) 8 MG disintegrating tablet Take 1 tablet (8 mg total) by mouth every 8 (eight) hours as needed. Patient not taking: Reported on 12/22/2019 05/20/19   Ripley Fraise, MD    Allergies    Codeine, Molds & smuts, and Zocor [simvastatin]  Review of Systems   Review of Systems  Constitutional: Negative for chills, diaphoresis, fatigue and fever.  HENT: Negative for congestion, sore throat and trouble swallowing.  Eyes: Negative for pain and visual disturbance.  Respiratory: Negative for cough, shortness of breath and wheezing.   Cardiovascular: Negative for chest pain, palpitations and leg swelling.  Gastrointestinal: Negative for abdominal distention, abdominal pain, diarrhea, nausea and vomiting.  Genitourinary: Negative for difficulty urinating.  Musculoskeletal: Negative for back pain, neck pain and neck stiffness.  Skin: Positive for wound. Negative for pallor.  Neurological: Positive for headaches. Negative for dizziness, tremors, seizures, syncope, facial asymmetry, speech difficulty, weakness and numbness.  Psychiatric/Behavioral: Negative for behavioral problems and confusion.    Physical Exam Updated Vital Signs BP (!) 178/87 (BP Location: Right Arm)   Pulse 64   Temp (!) 97.5 F (36.4 C) (Oral)   Resp 16   Ht 5\' 3"  (1.6 m)   Wt 58.5 kg   SpO2 100%   BMI 22.85 kg/m   Physical Exam Constitutional:      General: She is not in acute distress.    Appearance: Normal appearance. She is not ill-appearing, toxic-appearing or diaphoretic.  HENT:     Head:     Comments: Patient with small hematoma to her scalp, small superficial laceration (1cm) that has been clotted off and healed.  Bleeding has been controlled.    Mouth/Throat:     Mouth: Mucous membranes are moist.     Pharynx: Oropharynx is clear.  Eyes:     General: No scleral icterus.    Extraocular Movements: Extraocular movements intact.     Pupils:  Pupils are equal, round, and reactive to light.  Cardiovascular:     Rate and Rhythm: Normal rate and regular rhythm.     Pulses: Normal pulses.     Heart sounds: Normal heart sounds.  Pulmonary:     Effort: Pulmonary effort is normal. No respiratory distress.     Breath sounds: Normal breath sounds. No stridor. No wheezing, rhonchi or rales.  Chest:     Chest wall: No tenderness.  Abdominal:     General: Abdomen is flat. There is no distension.     Palpations: Abdomen is soft.     Tenderness: There is no abdominal tenderness. There is no guarding or rebound.  Musculoskeletal:        General: No swelling or tenderness. Normal range of motion.     Cervical back: Normal range of motion and neck supple. No rigidity.     Right lower leg: No edema.     Left lower leg: No edema.  Skin:    General: Skin is warm and dry.     Capillary Refill: Capillary refill takes less than 2 seconds.     Coloration: Skin is not pale.  Neurological:     General: No focal deficit present.     Mental Status: She is alert and oriented to person, place, and time.     Comments: Alert and oriented x3.. Clear speech. No facial droop. CNIII-XII grossly intact. Bilateral upper and lower extremities' sensation grossly intact. 5/5 symmetric strength with grip strength and with plantar and dorsi flexion bilaterally. Patellar DTRs are 2+ and symmetric . Normal finger to nose bilaterally. Negative pronator drift. Negative Romberg sign. Gait is steady and intact    Psychiatric:        Mood and Affect: Mood normal.        Behavior: Behavior normal.     ED Results / Procedures / Treatments   Labs (all labs ordered are listed, but only abnormal results are displayed) Labs Reviewed - No data to display  EKG None  Radiology CT Head Wo Contrast  Result Date: 12/22/2019 CLINICAL DATA:  Posttraumatic headache. Possible loss of consciousness. EXAM: CT HEAD WITHOUT CONTRAST TECHNIQUE: Contiguous axial images were  obtained from the base of the skull through the vertex without intravenous contrast. COMPARISON:  August 25, 2011. FINDINGS: Brain: No evidence of acute infarction, hemorrhage, hydrocephalus, extra-axial collection or mass lesion/mass effect. Vascular: No hyperdense vessel or unexpected calcification. Skull: Normal. Negative for fracture or focal lesion. Sinuses/Orbits: No acute finding. Other: None. IMPRESSION: Normal head CT. Electronically Signed   By: Lupita Raider M.D.   On: 12/22/2019 14:41    Procedures Procedures (including critical care time)  Medications Ordered in ED Medications  Tdap (BOOSTRIX) injection 0.5 mL (0.5 mLs Intramuscular Given 12/22/19 1438)  acetaminophen (TYLENOL) tablet 650 mg (650 mg Oral Given 12/22/19 1516)    ED Course  I have reviewed the triage vital signs and the nursing notes.  Pertinent labs & imaging results that were available during my care of the patient were reviewed by me and considered in my medical decision making (see chart for details).    MDM Rules/Calculators/A&P                         INETHA MARET is a 79 y.o. female with pertinent past medical history of anxiety, hyperlipidemia, hypertension that presents emergency department today for head injury that occurred this morning with small superficial overlying lac to scalp. Lac(1cm) has been clotted off and bleeding is controlled. Normal neuro exam. Normal CT. Pt with  Vision changes initally on right eye, but visial acuity with worsening vision in left eye. Will have pt follow up with optho. After reassessment pt denies any more vision changes, states she feels fine and is ready to go home. Pt agreeable to follow up with optho. Denies any eye pain, diplopia, blurry vision, floaters. Pt only wants Tylenol for pain.   .Doubt need for further emergent work up at this time. I explained the diagnosis and have given explicit precautions to return to the ER including for any other new or worsening  symptoms. The patient understands and accepts the medical plan as it's been dictated and I have answered their questions. Discharge instructions concerning home care and prescriptions have been given. The patient is STABLE and is discharged to home in good condition.  .I discussed this case with my attending physician who cosigned this note including patient's presenting symptoms, physical exam, and planned diagnostics and interventions. Attending physician stated agreement with plan or made changes to plan which were implemented.    Final Clinical Impression(s) / ED Diagnoses Final diagnoses:  Laceration of scalp, initial encounter    Rx / DC Orders ED Discharge Orders    None       Farrel Gordon, PA-C 12/23/19 1338    Derwood Kaplan, MD 12/24/19 1016

## 2019-12-22 NOTE — ED Notes (Signed)
Patient is being discharged from the Urgent Care and sent to the Emergency Department via POV  . Per Doyce Para, patient is in need of higher level of care due to head injury. Patient is aware and verbalizes understanding of plan of care. There were no vitals filed for this visit.

## 2020-01-24 IMAGING — MR MR LUMBAR SPINE W/O CM
4 of 6 series · 13 of 48 positions shown · non-contrast
Comparison: Plain films lumbar spine 08/08/2017. MRI lumbar spine
04/27/2016.

CLINICAL DATA: Low back pain radiating into both legs for 6 years.
No known injury.

EXAM:
MRI LUMBAR SPINE WITHOUT CONTRAST
TECHNIQUE: Multiplanar, multisequence MR imaging of the lumbar spine was
performed. No intravenous contrast was administered.

[Series 3: T2 · sagittal · 4.0mm · 0.55mm/px · 4 of 13 slices shown (1 of 4)]
[im 1/13]
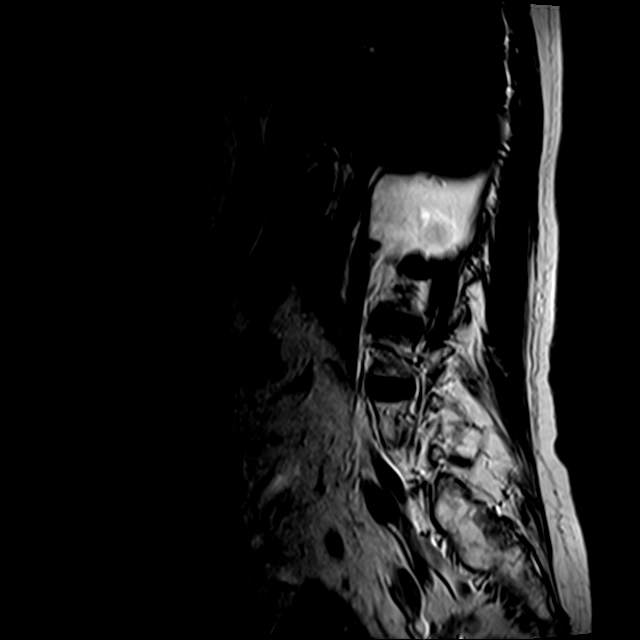
[im 4/13]
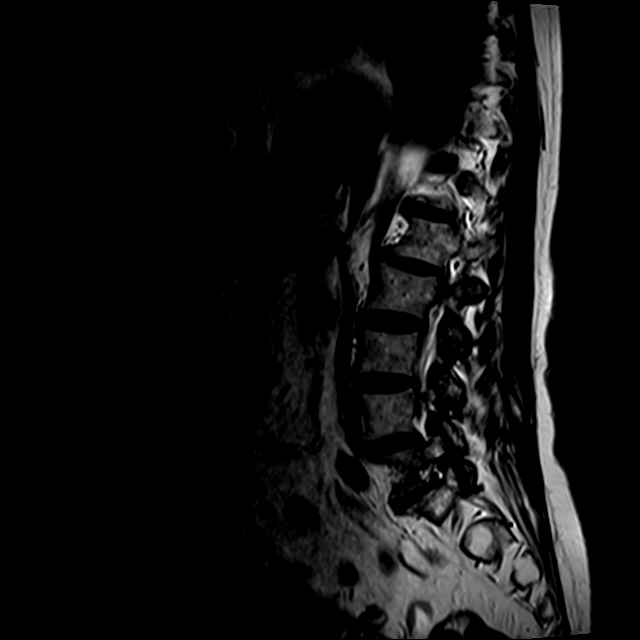
[im 7/13]
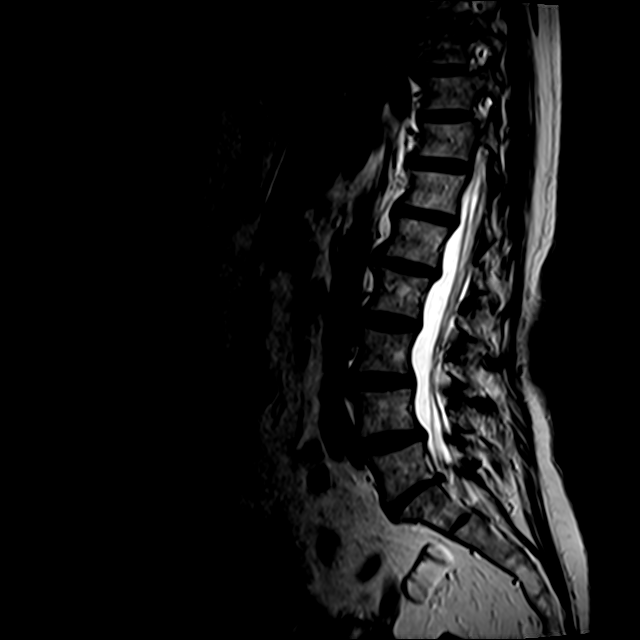
[im 13/13]
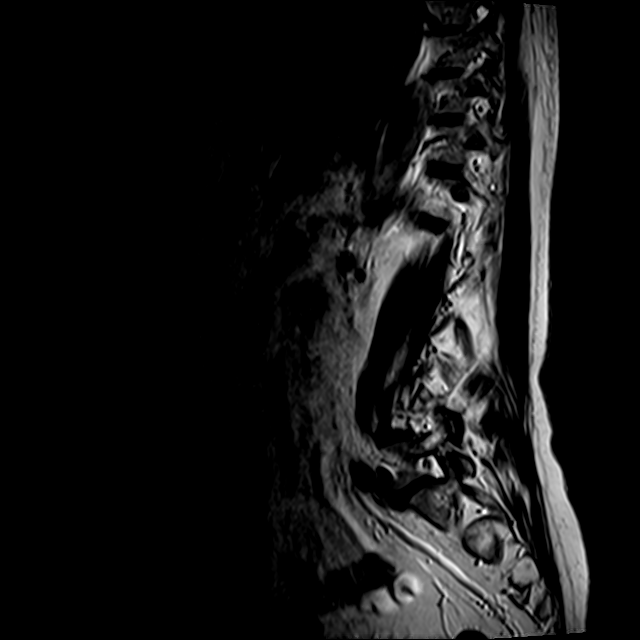

[Series 5: T2 · sagittal · 4.0mm · 0.55mm/px · 3 of 18 slices shown (2 of 4)]
[im 4/18]
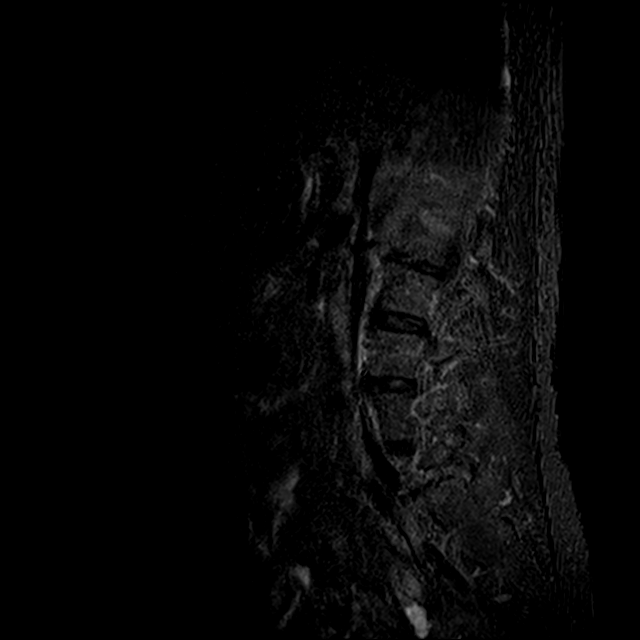
[im 11/18]
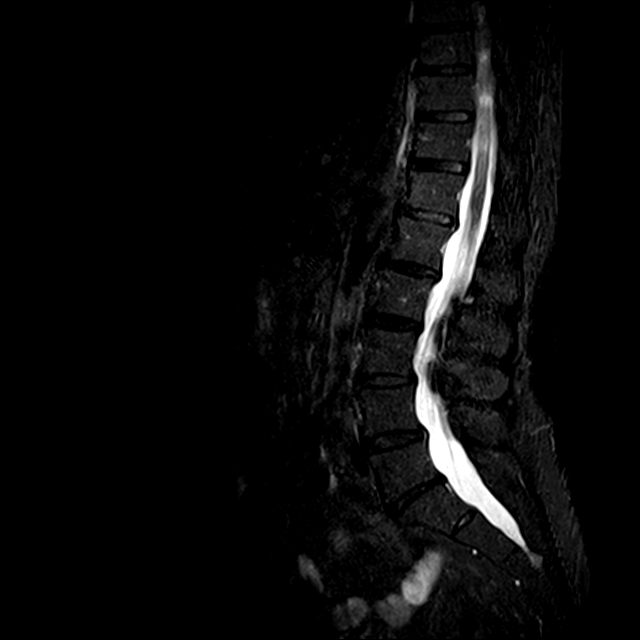
[im 18/18]
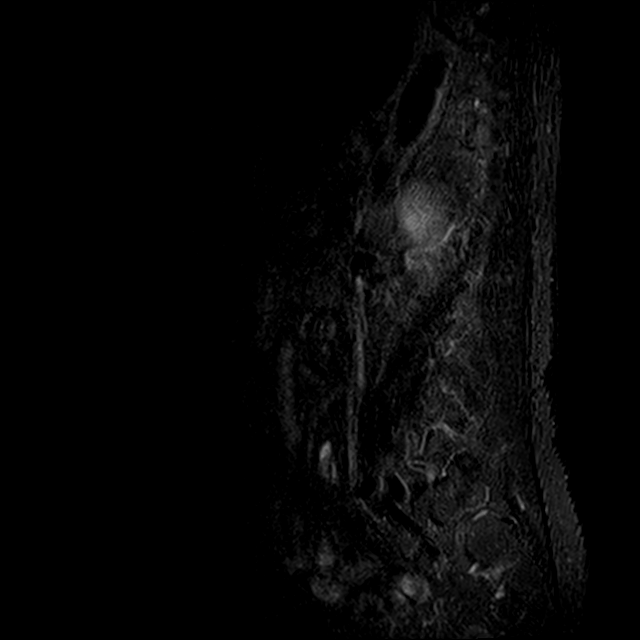

[Series 6: T2 · axial · 4.0mm · 0.26mm/px · z∈[-78,+62]mm · 3 of 38 slices shown (3 of 4)]
[im 7/38]
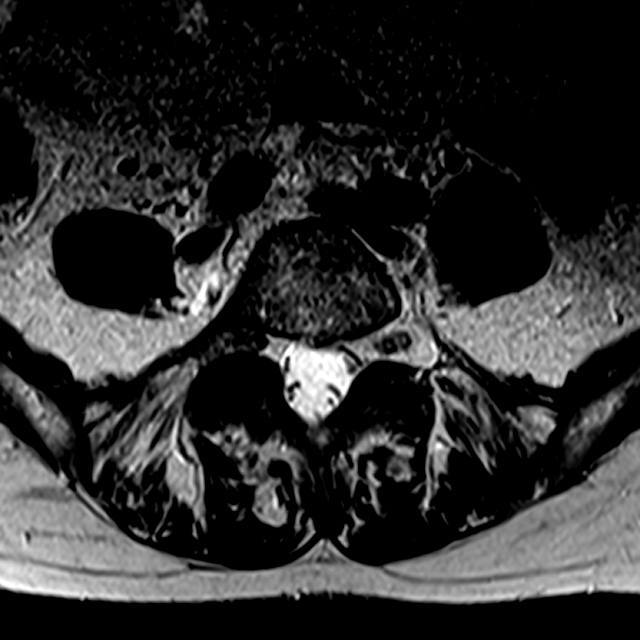
[im 19/38]
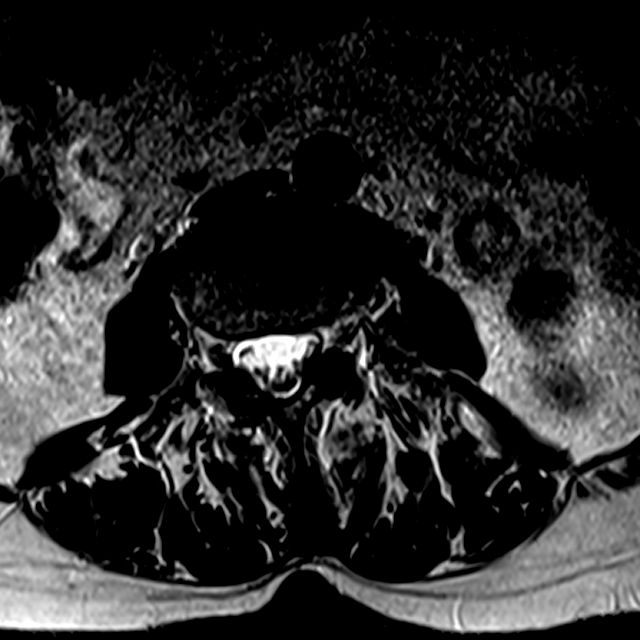
[im 31/38]
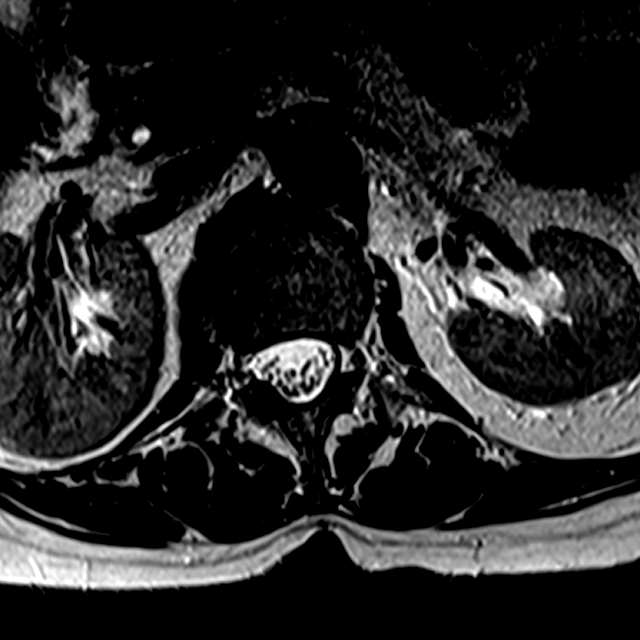

[Series 8: T2 · coronal · 4.0mm · 0.55mm/px · 3 of 17 slices shown (4 of 4)]
[im 4/17]
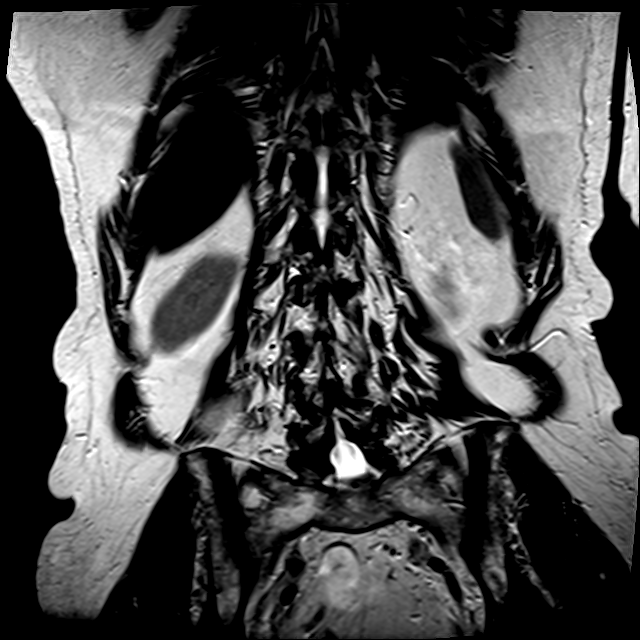
[im 10/17]
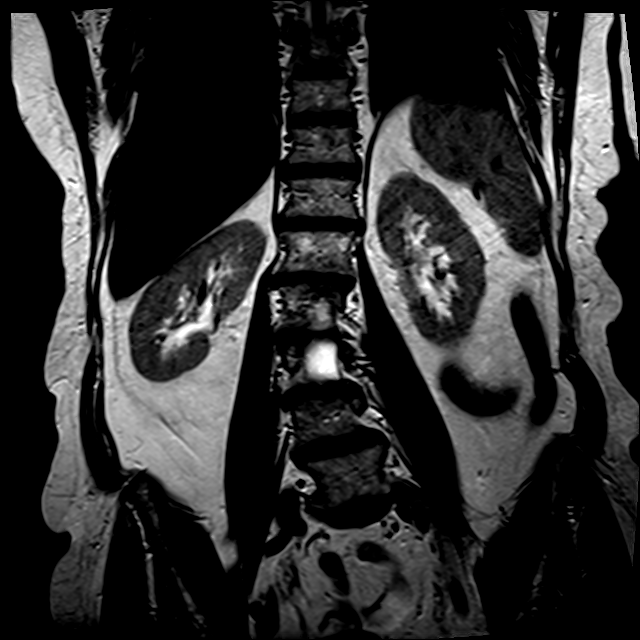
[im 17/17]
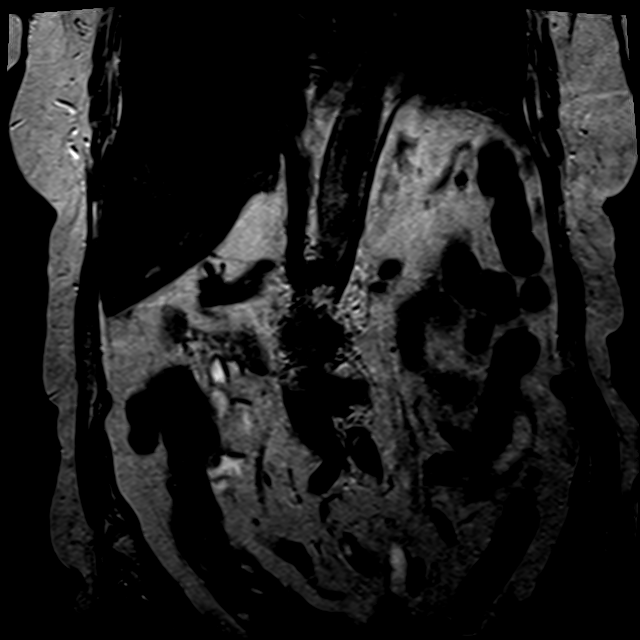

[13 of 48 positions shown; findings below may reference images not displayed]

FINDINGS: Segmentation:  Standard.

Alignment: Convex right scoliosis with the apex at L2-3. Trace
retrolisthesis L1 on L2 is identified. There is also trace
anterolisthesis L3 on L4 and 0.3 cm anterolisthesis L4 on L5.

Vertebrae:  No fracture or worrisome lesion.

Conus medullaris and cauda equina: Conus extends to the T12-L1
level. Conus and cauda equina appear normal.

Paraspinal and other soft tissues: Negative.

Disc levels:

T8-9 to T11-12 are imaged in the sagittal plane only and negative.

T12-L1: Tiny central protrusion without stenosis.

L1-2: Very shallow disc bulge without stenosis.

L2-3: Minimal disc bulge to the left without stenosis.

L3-4: Moderate bilateral facet degenerative disease is unchanged.
The disc is uncovered with a minimal bulge but the central canal and
foramina open.

L4-5: Moderate bilateral facet degenerative disease is worse on the
left. The disc is uncovered with a shallow bulge. The central canal
and foramina are open.

L5-S1: Loss of disc space height and a shallow disc bulge. The
central spinal canal and left foramen are widely patent. Disc and
endplate spur in the right foramen result in some encroachment on
the exiting right L5 root, unchanged.
IMPRESSION: No new abnormality since the prior examination.

Negative for central canal stenosis. Disc and endplate spur result
in encroachment on the exiting right L5 root at L5-S1. The foramina
are otherwise widely patent.

Scoliosis.

## 2020-01-30 ENCOUNTER — Ambulatory Visit (INDEPENDENT_AMBULATORY_CARE_PROVIDER_SITE_OTHER): Payer: Medicare Other | Admitting: Adult Health

## 2020-01-30 ENCOUNTER — Encounter: Payer: Self-pay | Admitting: Adult Health

## 2020-01-30 ENCOUNTER — Other Ambulatory Visit: Payer: Self-pay

## 2020-01-30 VITALS — BP 118/64 | HR 73 | Ht 63.0 in | Wt 134.0 lb

## 2020-01-30 DIAGNOSIS — N764 Abscess of vulva: Secondary | ICD-10-CM | POA: Diagnosis not present

## 2020-01-30 MED ORDER — SILVER SULFADIAZINE 1 % EX CREA: 1.0000 "application " | TOPICAL_CREAM | Freq: Two times a day (BID) | CUTANEOUS | 0 refills | Status: DC

## 2020-01-30 MED ORDER — SULFAMETHOXAZOLE-TRIMETHOPRIM 800-160 MG PO TABS: 1.0000 | ORAL_TABLET | Freq: Two times a day (BID) | ORAL | 0 refills | Status: DC

## 2020-01-30 NOTE — Progress Notes (Signed)
  Subjective:     Patient ID: Anne Davenport, female   DOB: 1941/03/28, 79 y.o.   MRN: [id number]  HPI Anne Davenport is a 79 year old white female,married, sp hysterectomy in complaining of ?cyst on vulva area. PCP is Dr Sudie Bailey.   Review of Systems Has cyst on vulva area for about 1-2 weeks,looked like a pimple, now red and tender Reviewed past medical,surgical, social and family history. Reviewed medications and allergies.     Objective:   Physical Exam BP (!) 118/64 (BP Location: Left Arm, Patient Position: Sitting, Cuff Size: Normal)   Pulse 73   Ht 5\' 3"  (1.6 m)   Wt 134 lb (60.8 kg)   BMI 23.74 kg/m  Skin warm and dry.Pelvic: external genitalia is normal in appearance, but has marble sized firm, tender nodule, right labia near base, no induration Examination chaperoned by LPN    Assessment:     1. Boil, vulva Use warm moist compress 2-3 x daily Will rx septra ds and silvadene Meds ordered this encounter  Medications  . sulfamethoxazole-trimethoprim (BACTRIM DS) 800-160 MG tablet    Sig: Take 1 tablet by mouth 2 (two) times daily. Take 1 bid    Dispense:  28 tablet    Refill:  0    Order Specific Question:   Supervising Provider    Answer:   EURE, LUTHER H [[id number]]  . silver sulfADIAZINE (SILVADENE) 1 % cream    Sig: Apply 1 application topically 2 (two) times daily.    Dispense:  25 g    Refill:  0    Order Specific Question:   Supervising Provider    Answer:   Faith Rogue, LUTHER H [[id number]]  Do not squeeze    Plan:     Follow up in 1 week for recheck

## 2020-01-30 NOTE — Patient Instructions (Signed)
Skin Abscess  A skin abscess is an infected area on or under your skin that contains a collection of pus and other material. An abscess may also be called a furuncle, carbuncle, or boil. An abscess can occur in or on almost any part of your body. Some abscesses break open (rupture) on their own. Most continue to get worse unless they are treated. The infection can spread deeper into the body and eventually into your blood, which can make you feel ill. Treatment usually involves draining the abscess. What are the causes? An abscess occurs when germs, like bacteria, pass through your skin and cause an infection. This may be caused by:  A scrape or cut on your skin.  A puncture wound through your skin, including a needle injection or insect bite.  Blocked oil or sweat glands.  Blocked and infected hair follicles.  A cyst that forms beneath your skin (sebaceous cyst) and becomes infected. What increases the risk? This condition is more likely to develop in people who:  Have a weak body defense system (immune system).  Have diabetes.  Have dry and irritated skin.  Get frequent injections or use illegal IV drugs.  Have a foreign body in a wound, such as a splinter.  Have problems with their lymph system or veins. What are the signs or symptoms? Symptoms of this condition include:  A painful, firm bump under the skin.  A bump with pus at the top. This may break through the skin and drain. Other symptoms include:  Redness surrounding the abscess site.  Warmth.  Swelling of the lymph nodes (glands) near the abscess.  Tenderness.  A sore on the skin. How is this diagnosed? This condition may be diagnosed based on:  A physical exam.  Your medical history.  A sample of pus. This may be used to find out what is causing the infection.  Blood tests.  Imaging tests, such as an ultrasound, CT scan, or MRI. How is this treated? A small abscess that drains on its own may  not need treatment. Treatment for larger abscesses may include:  Moist heat or heat pack applied to the area several times a day.  A procedure to drain the abscess (incision and drainage).  Antibiotic medicines. For a severe abscess, you may first get antibiotics through an IV and then change to antibiotics by mouth. Follow these instructions at home: Medicines   Take over-the-counter and prescription medicines only as told by your health care provider.  If you were prescribed an antibiotic medicine, take it as told by your health care provider. Do not stop taking the antibiotic even if you start to feel better. Abscess care   If you have an abscess that has not drained, apply heat to the affected area. Use the heat source that your health care provider recommends, such as a moist heat pack or a heating pad. ? Place a towel between your skin and the heat source. ? Leave the heat on for 20-30 minutes. ? Remove the heat if your skin turns bright red. This is especially important if you are unable to feel pain, heat, or cold. You may have a greater risk of getting burned.  Follow instructions from your health care provider about how to take care of your abscess. Make sure you: ? Cover the abscess with a bandage (dressing). ? Change your dressing or gauze as told by your health care provider. ? Wash your hands with soap and water before you change the   dressing or gauze. If soap and water are not available, use hand sanitizer.  Check your abscess every day for signs of a worsening infection. Check for: ? More redness, swelling, or pain. ? More fluid or blood. ? Warmth. ? More pus or a bad smell. General instructions  To avoid spreading the infection: ? Do not share personal care items, towels, or hot tubs with others. ? Avoid making skin contact with other people.  Keep all follow-up visits as told by your health care provider. This is important. Contact a health care provider if  you have:  More redness, swelling, or pain around your abscess.  More fluid or blood coming from your abscess.  Warm skin around your abscess.  More pus or a bad smell coming from your abscess.  A fever.  Muscle aches.  Chills or a general ill feeling. Get help right away if you:  Have severe pain.  See red streaks on your skin spreading away from the abscess. Summary  A skin abscess is an infected area on or under your skin that contains a collection of pus and other material.  A small abscess that drains on its own may not need treatment.  Treatment for larger abscesses may include having a procedure to drain the abscess and taking an antibiotic. This information is not intended to replace advice given to you by your health care provider. Make sure you discuss any questions you have with your health care provider. Document Revised: 10/17/2018 Document Reviewed: 08/09/2017 Elsevier Patient Education  2020 [employer]  

## 2020-02-06 ENCOUNTER — Ambulatory Visit (INDEPENDENT_AMBULATORY_CARE_PROVIDER_SITE_OTHER): Payer: Medicare Other | Admitting: Adult Health

## 2020-02-06 ENCOUNTER — Encounter: Payer: Self-pay | Admitting: Adult Health

## 2020-02-06 VITALS — BP 126/66 | HR 67 | Ht 63.0 in | Wt 133.5 lb

## 2020-02-06 DIAGNOSIS — N764 Abscess of vulva: Secondary | ICD-10-CM | POA: Diagnosis not present

## 2020-02-06 NOTE — Progress Notes (Signed)
  Subjective:     Patient ID: Anne Davenport, female   DOB: 25-Mar-1941, 79 y.o.   MRN: [id number]  HPI Edith is a 79 year old white female, married, sp hysterectomy back in follow up on boil right labia and feels better.  PCP is Dr Sudie Bailey.  Review of Systems Area right labia feels better Reviewed past medical,surgical, social and family history. Reviewed medications and allergies.     Objective:   Physical Exam BP 126/66 (BP Location: Left Arm, Patient Position: Sitting, Cuff Size: Normal)   Pulse 67   Ht 5\' 3"  (1.6 m)   Wt 133 lb 8 oz (60.6 kg)   BMI 23.65 kg/m    Skin is warm and dry External genitalia: the area on right labia is non tender and has gotten smaller in size, pea sized now. Examination chaperoned by LPN.  Assessment:     1. Boil, vulva,resolving Finish septra ds and continue to use silvadene  Continue warm compresses   Plan:     Follow up in 2 weeks

## 2020-02-20 ENCOUNTER — Ambulatory Visit (INDEPENDENT_AMBULATORY_CARE_PROVIDER_SITE_OTHER): Payer: Medicare Other | Admitting: Adult Health

## 2020-02-20 ENCOUNTER — Encounter: Payer: Self-pay | Admitting: Adult Health

## 2020-02-20 VITALS — BP 159/71 | HR 58 | Ht 63.0 in | Wt 134.0 lb

## 2020-02-20 DIAGNOSIS — N764 Abscess of vulva: Secondary | ICD-10-CM

## 2020-02-20 NOTE — Progress Notes (Signed)
  Subjective:     Patient ID: Judeen Hammans, female   DOB: 1941-07-06, 79 y.o.   MRN: [id number]  HPI Farha is a 79 year old white female,married,sp hysterectomy in for follow up on boil right labia and she says it has resolved. PCP is Dr Sudie Bailey.   Review of Systems Boil has resolved she says, was in shower and noticed, some stinging and got mirror and squeezed area and hard something like grain of rice popped out, and it is fine now Reviewed past medical,surgical, social and family history. Reviewed medications and allergies.     Objective:   Physical Exam BP (!) 159/71 (BP Location: Left Arm, Patient Position: Sitting, Cuff Size: Normal)   Pulse (!) 58   Ht 5\' 3"  (1.6 m)   Wt 134 lb (60.8 kg)   BMI 23.74 kg/m    Skin is warm and dry. External genitalia is normal in appearance, the boil on the right labia has resolved..  Assessment:     Boil, on vulva has resolved    Plan:     Follow up prn

## 2020-03-23 ENCOUNTER — Encounter: Payer: Self-pay | Admitting: Orthopaedic Surgery

## 2020-03-23 ENCOUNTER — Ambulatory Visit (INDEPENDENT_AMBULATORY_CARE_PROVIDER_SITE_OTHER): Payer: Medicare Other | Admitting: Orthopaedic Surgery

## 2020-03-23 ENCOUNTER — Other Ambulatory Visit: Payer: Self-pay

## 2020-03-23 DIAGNOSIS — G8929 Other chronic pain: Secondary | ICD-10-CM

## 2020-03-23 DIAGNOSIS — M25511 Pain in right shoulder: Secondary | ICD-10-CM | POA: Diagnosis not present

## 2020-03-23 NOTE — Progress Notes (Signed)
PROCEDURE NOTE:  The patient request injection, verbal consent was obtained.  The right shoulder was prepped appropriately after time out was performed.   Sterile technique was observed and injection of 1 cc of Depo-Medrol 40 mg with several cc's of plain xylocaine. Anesthesia was provided by ethyl chloride and a 20-gauge needle was used to inject the shoulder area. A posterior approach was used.  The injection was tolerated well.  A band aid dressing was applied.  The patient was advised to apply ice later today and tomorrow to the injection sight as needed.  I will see as needed.  Electronically Signed Darreld Mclean, MD 9/14/20212:46 PM

## 2020-03-25 ENCOUNTER — Other Ambulatory Visit (HOSPITAL_COMMUNITY): Payer: Self-pay | Admitting: Family Medicine

## 2020-03-25 DIAGNOSIS — Z1231 Encounter for screening mammogram for malignant neoplasm of breast: Secondary | ICD-10-CM

## 2020-04-20 ENCOUNTER — Encounter: Payer: Self-pay | Admitting: Orthopaedic Surgery

## 2020-04-20 ENCOUNTER — Ambulatory Visit (INDEPENDENT_AMBULATORY_CARE_PROVIDER_SITE_OTHER): Payer: Medicare Other | Admitting: Orthopaedic Surgery

## 2020-04-20 ENCOUNTER — Other Ambulatory Visit: Payer: Self-pay

## 2020-04-20 DIAGNOSIS — M7061 Trochanteric bursitis, right hip: Secondary | ICD-10-CM | POA: Diagnosis not present

## 2020-04-20 NOTE — Progress Notes (Signed)
PROCEDURE NOTE:  The patient request injection, verbal consent was obtained.  The right trochanteric area of the hip was prepped appropriately after time out was performed.   Sterile technique was observed and injection of 1 cc of Depo-Medrol 40 mg with several cc's of plain xylocaine. Anesthesia was provided by ethyl chloride and a 20-gauge needle was used to inject the hip area. The injection was tolerated well.  A band aid dressing was applied.  The patient was advised to apply ice later today and tomorrow to the injection sight as needed.  Return as needed.  Electronically Signed Darreld Mclean, MD 10/12/20211:39 PM

## 2020-05-04 ENCOUNTER — Ambulatory Visit: Payer: Medicare Other | Admitting: Orthopaedic Surgery

## 2020-05-31 ENCOUNTER — Ambulatory Visit (HOSPITAL_COMMUNITY)
Admission: RE | Admit: 2020-05-31 | Discharge: 2020-05-31 | Disposition: A | Discharge status: Home / Self Care | Payer: Medicare Other | Source: Ambulatory Visit | Attending: Family Medicine | Admitting: Family Medicine

## 2020-05-31 ENCOUNTER — Other Ambulatory Visit: Payer: Self-pay

## 2020-05-31 DIAGNOSIS — Z1231 Encounter for screening mammogram for malignant neoplasm of breast: Secondary | ICD-10-CM | POA: Insufficient documentation

## 2020-07-21 ENCOUNTER — Other Ambulatory Visit (HOSPITAL_COMMUNITY): Payer: Self-pay | Admitting: Otolaryngology

## 2020-07-21 DIAGNOSIS — R1312 Dysphagia, oropharyngeal phase: Secondary | ICD-10-CM

## 2020-07-23 ENCOUNTER — Telehealth: Payer: Self-pay | Admitting: Adult Health

## 2020-07-23 MED ORDER — SULFAMETHOXAZOLE-TRIMETHOPRIM 800-160 MG PO TABS: 1.0000 | ORAL_TABLET | Freq: Two times a day (BID) | ORAL | 0 refills | Status: DC

## 2020-07-23 NOTE — Telephone Encounter (Signed)
Anne Davenport has some frequency and burning, is taking AZO and getting cranberry juice wil rx septra ds

## 2020-07-23 NOTE — Telephone Encounter (Signed)
Pt feels she has a UTI or bladder infection, starting hurting about dinner time yesterday Burning with urination since pain started No available nurse or provider visits  Can we call in something?  Please advise & call pt    [facility]

## 2020-07-30 ENCOUNTER — Other Ambulatory Visit (HOSPITAL_COMMUNITY): Payer: Self-pay | Admitting: Otolaryngology

## 2020-07-30 ENCOUNTER — Ambulatory Visit (HOSPITAL_COMMUNITY)
Admission: RE | Admit: 2020-07-30 | Discharge: 2020-07-30 | Disposition: A | Discharge status: Home / Self Care | Payer: Medicare Other | Source: Ambulatory Visit | Attending: Otolaryngology | Admitting: Otolaryngology

## 2020-07-30 ENCOUNTER — Other Ambulatory Visit: Payer: Self-pay

## 2020-07-30 DIAGNOSIS — R1312 Dysphagia, oropharyngeal phase: Secondary | ICD-10-CM

## 2020-08-02 ENCOUNTER — Other Ambulatory Visit (INDEPENDENT_AMBULATORY_CARE_PROVIDER_SITE_OTHER): Payer: Medicare Other

## 2020-08-02 ENCOUNTER — Other Ambulatory Visit: Payer: Self-pay

## 2020-08-02 DIAGNOSIS — Z1389 Encounter for screening for other disorder: Secondary | ICD-10-CM

## 2020-08-02 DIAGNOSIS — R399 Unspecified symptoms and signs involving the genitourinary system: Secondary | ICD-10-CM

## 2020-08-02 LAB — POCT URINALYSIS DIPSTICK OB
Glucose, UA: NEGATIVE
Ketones, UA: NEGATIVE
Leukocytes, UA: NEGATIVE
Nitrite, UA: NEGATIVE
POC,PROTEIN,UA: NEGATIVE

## 2020-08-02 NOTE — Progress Notes (Addendum)
   NURSE VISIT- UTI SYMPTOMS   SUBJECTIVE:  Anne Davenport is a 80 y.o. [phone] female here for UTI symptoms. She is a GYN patient. She reports lower abdominal pain, urinary frequency and urinary urgency.  OBJECTIVE:  There were no vitals taken for this visit.  Appears well, in no apparent distress  Results for orders placed or performed in visit on 08/02/20 (from the past 24 hour(s))  POC Urinalysis Dipstick OB   Collection Time: 08/02/20  2:26 PM  Result Value Ref Range   Color, UA     Clarity, UA     Glucose, UA Negative Negative   Bilirubin, UA     Ketones, UA neg    Spec Grav, UA     Blood, UA mod    pH, UA     POC,PROTEIN,UA Negative Negative, Trace, Small (1+), Moderate (2+), Large (3+), 4+   Urobilinogen, UA     Nitrite, UA neg    Leukocytes, UA Negative Negative   Appearance     Odor      ASSESSMENT: GYN patient with UTI symptoms and negative nitrites  PLAN: Note routed to Dr. Despina Hidden   Rx sent by provider today: No Urine culture sent Call or return to clinic prn if these symptoms worsen or fail to improve as anticipated. Follow-up: as needed   Anne Davenport  08/02/2020 2:54 PM   Attestation of Attending Supervision of Nursing Visit Encounter: Evaluation and management procedures were performed by the nursing staff under my supervision and collaboration.  I have reviewed the nurse's note and chart, and I agree with the management and plan.  Rockne Coons MD Attending Physician for the Center for [facility] 08/02/2020 3:02 PM

## 2020-08-05 ENCOUNTER — Telehealth: Payer: Self-pay | Admitting: Obstetrics & Gynecology

## 2020-08-05 ENCOUNTER — Telehealth: Payer: Self-pay | Admitting: Adult Health

## 2020-08-05 ENCOUNTER — Other Ambulatory Visit: Payer: Self-pay | Admitting: Obstetrics & Gynecology

## 2020-08-05 LAB — URINE CULTURE

## 2020-08-05 LAB — SPECIMEN STATUS REPORT

## 2020-08-05 MED ORDER — FLUCONAZOLE 100 MG PO TABS: 100.0000 mg | ORAL_TABLET | Freq: Every day | ORAL | 0 refills | Status: DC

## 2020-08-05 NOTE — Telephone Encounter (Signed)
Patient called and wanted Dr. Despina Hidden to know that she did receive his message. Clinical staff will follow up with patient.

## 2020-08-05 NOTE — Telephone Encounter (Signed)
Left message letting pt know urine culture is still pending. Culture was reincubated. Not sure how long that will take. Will let her know when we know something. JSY

## 2020-08-05 NOTE — Telephone Encounter (Signed)
Thank you :)

## 2020-08-05 NOTE — Telephone Encounter (Signed)
Pt called to check on recent urine results & to ask if Victorino Dike was going to order Rx to [facility]  Please advise & notify pt

## 2020-08-31 ENCOUNTER — Ambulatory Visit: Payer: Medicare Other | Admitting: Orthopaedic Surgery

## 2020-09-07 ENCOUNTER — Ambulatory Visit: Payer: Medicare Other | Admitting: Orthopaedic Surgery

## 2020-09-09 ENCOUNTER — Encounter: Payer: Self-pay | Admitting: Orthopaedic Surgery

## 2020-09-09 ENCOUNTER — Other Ambulatory Visit: Payer: Self-pay

## 2020-09-09 ENCOUNTER — Ambulatory Visit (INDEPENDENT_AMBULATORY_CARE_PROVIDER_SITE_OTHER): Payer: Medicare Other | Admitting: Orthopaedic Surgery

## 2020-09-09 VITALS — Ht 63.0 in | Wt 134.0 lb

## 2020-09-09 DIAGNOSIS — G8929 Other chronic pain: Secondary | ICD-10-CM | POA: Diagnosis not present

## 2020-09-09 DIAGNOSIS — M25511 Pain in right shoulder: Secondary | ICD-10-CM | POA: Diagnosis not present

## 2020-09-09 NOTE — Progress Notes (Signed)
PROCEDURE NOTE:  The patient request injection, verbal consent was obtained.  The right shoulder was prepped appropriately after time out was performed.   Sterile technique was observed and injection of 1 cc of Celestone 6 mg with several cc's of plain xylocaine. Anesthesia was provided by ethyl chloride and a 20-gauge needle was used to inject the shoulder area. A posterior approach was used.  The injection was tolerated well.  A band aid dressing was applied.  The patient was advised to apply ice later today and tomorrow to the injection sight as needed.  See as needed.  Electronically Signed Darreld Mclean, MD 3/3/20229:35 AM

## 2020-09-09 NOTE — Patient Instructions (Signed)
Advil Dual Action: Pain Relief Medication to Combine Acetaminophen+Ibuprofen

## 2020-11-08 ENCOUNTER — Telehealth: Payer: Self-pay | Admitting: Orthopaedic Surgery

## 2020-11-08 MED ORDER — TIZANIDINE HCL 4 MG PO TABS: ORAL_TABLET | ORAL | 0 refills | Status: DC

## 2020-11-08 NOTE — Telephone Encounter (Signed)
Anne Davenport called and requested prescription for Tizanidine 4 mgs.  She said had an old prescription for this from 2019 for back spasms.  She said she is having back spasms now.   She said she uses [facility]

## 2020-11-09 ENCOUNTER — Ambulatory Visit: Payer: Medicare Other | Admitting: Orthopaedic Surgery

## 2020-11-16 ENCOUNTER — Other Ambulatory Visit: Payer: Self-pay

## 2020-11-16 ENCOUNTER — Encounter: Payer: Self-pay | Admitting: Orthopaedic Surgery

## 2020-11-16 ENCOUNTER — Ambulatory Visit: Payer: Medicare Other

## 2020-11-16 ENCOUNTER — Ambulatory Visit (INDEPENDENT_AMBULATORY_CARE_PROVIDER_SITE_OTHER): Payer: Medicare Other | Admitting: Orthopaedic Surgery

## 2020-11-16 VITALS — BP 139/80 | HR 77 | Ht 63.0 in | Wt 134.0 lb

## 2020-11-16 DIAGNOSIS — G8929 Other chronic pain: Secondary | ICD-10-CM | POA: Diagnosis not present

## 2020-11-16 DIAGNOSIS — M546 Pain in thoracic spine: Secondary | ICD-10-CM | POA: Diagnosis not present

## 2020-11-16 DIAGNOSIS — M25511 Pain in right shoulder: Secondary | ICD-10-CM

## 2020-11-16 NOTE — Patient Instructions (Signed)
While we are working on your approval please go ahead and call to schedule your appointment with [facility] in at least one (1) week.  ° °Central Scheduling °[phone] °

## 2020-11-16 NOTE — Progress Notes (Signed)
Patient Anne Davenport:JIRC Helene Kelp, female DOB:1941/03/17, 80 y.o. VEL:[id number]  Chief Complaint  Patient presents with  . Shoulder Pain    Right/ wants injection   . Back Pain    Thoracic pain. Feels like something stabbing her in back     HPI  [name] is a 80 y.o. female who has right shoulder pain and mid thoracic pain. The right shoulder has gotten worse over the last month.  She has pain with overhead use.  She has no trauma, no numbness.  The back and mid back have recurrent pain that comes and goes.  She had problems in late 2015 and early 2016 with MRI of the thoracic spine in 2016 January.  I have reviewed the reports.  She has no trauma but the mid back is becoming more painful.  She has no numbness.  She has pain medicine but has not taken any.   Body mass index is 23.74 kg/m.  ROS  Review of Systems  Respiratory: Negative for cough and shortness of breath.   Cardiovascular: Negative for chest pain.  Endocrine: Positive for cold intolerance.  Musculoskeletal: Positive for arthralgias, back pain, gait problem and joint swelling.  Allergic/Immunologic: Positive for environmental allergies.  Psychiatric/Behavioral: The patient is nervous/anxious.   All other systems reviewed and are negative.   All other systems reviewed and are negative.  The following is a summary of the past history medically, past history surgically, known current medicines, social history and family history.  This information is gathered electronically by the computer from prior information and documentation.  I review this each visit and have found including this information at this point in the chart is beneficial and informative.    Past Medical History:  Diagnosis Date  . Anxiety   . Back pain    mid back  . Bartholin cyst 05/20/2015  . Family history of adverse reaction to anesthesia    Daughter stopped breathing with Ativan  . 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 Hyperlipemia   .  Hypertension   . IBS (irritable bowel syndrome)   . 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 Screening for STD (sexually transmitted disease) 07/23/2015    Past Surgical History:  Procedure Laterality Date  . ABDOMINAL HYSTERECTOMY    . BLADDER REPAIR    . COLONOSCOPY N/A 06/24/2015   Procedure: COLONOSCOPY;  Surgeon: Malissa Hippo, MD;  [facility];  Service: Endoscopy;  Laterality: N/A;  730  . ESOPHAGOGASTRODUODENOSCOPY N/A 08/14/2014   Procedure: ESOPHAGOGASTRODUODENOSCOPY (EGD);  Surgeon: Malissa Hippo, MD;  Location: [facility];  Service: Endoscopy;  Laterality: N/A;  1:10  . TONSILLECTOMY    . WRIST SURGERY Left     Family History  Problem Relation Age of Onset  . Diabetes Mother   . Hypertension Mother   . Alcohol abuse Brother   . Cancer Daughter        breast  . Hodgkin's lymphoma Daughter   . Hypertension Daughter   . Heart disease Daughter   . Diabetes Maternal Grandfather   . Heart disease Maternal Grandfather   . Other Paternal Grandfather        poor circulation    Social History Social History   Tobacco Use  . Smoking status: Former Smoker    Packs/day: 0.50    Years: 10.00    Pack years: 5.00    Types: Cigarettes    Quit date: 07/09/1984    Years  since quitting: 36.3  . Smokeless tobacco: Never Used  Vaping Use  . Vaping Use: Never used  Substance Use Topics  . Alcohol use: Yes    Alcohol/week: 0.0 standard drinks    Comment: "once in a while"  . Drug use: No    Allergies  Allergen Reactions  . Codeine Itching and Nausea And Vomiting  . Molds & Smuts Other (See Comments)    Reaction:  Itchy, watery eyes   . Zocor [Simvastatin] Nausea And Vomiting and Rash    Current Outpatient Medications  Medication Sig Dispense Refill  . acetaminophen (TYLENOL) 500 MG tablet Take 500 mg by mouth every 6 (six) hours as needed for mild pain, moderate pain or headache.    . Calcium  Carbonate-Vitamin D 600-400 MG-UNIT tablet Take 1 tablet by mouth.     . estradiol (CLIMARA - DOSED IN MG/24 HR) 0.1 mg/24hr Place 1 patch onto the skin every Saturday.    . fluticasone (FLONASE) 50 MCG/ACT nasal spray Place 2 sprays into the nose daily as needed for rhinitis.     Marland Kitchen HYDROcodone-acetaminophen (NORCO/VICODIN) 5-325 MG tablet One tablet every four hours as needed for acute pain.  Limit of five days per [place]. 30 tablet 0  . levothyroxine (SYNTHROID, LEVOTHROID) 75 MCG tablet Take 75 mcg by mouth at bedtime.     Marland Kitchen LORazepam (ATIVAN) 0.5 MG tablet Take 1 tablet (0.5 mg total) by mouth 2 (two) times daily as needed for anxiety. (Patient taking differently: Take 0.5 mg by mouth at bedtime.) 60 tablet 0  . lovastatin (MEVACOR) 20 MG tablet Take 20 mg by mouth at bedtime.     . Naproxen Sodium (ALEVE PO) Take 1 tablet by mouth as needed (back pain).     Marland Kitchen sertraline (ZOLOFT) 50 MG tablet Take 25 mg by mouth. Every other night    . tiZANidine (ZANAFLEX) 4 MG tablet One by mouth every night before bed as needed for spasm 30 tablet 0   No current facility-administered medications for this visit.     Physical Exam  Blood pressure 139/80, pulse 77, height 5\' 3"  (1.6 m), weight 134 lb (60.8 kg).  Constitutional: overall normal hygiene, normal nutrition, well developed, normal grooming, normal body habitus. Assistive device:none  Musculoskeletal: gait and station Limp none, muscle tone and strength are normal, no tremors or atrophy is present.  .  Neurological: coordination overall normal.  Deep tendon reflex/nerve stretch intact.  Sensation normal.  Cranial nerves II-XII intact.   Skin:   Normal overall no scars, lesions, ulcers or rashes. No psoriasis.  Psychiatric: Alert and oriented x 3.  Recent memory intact, remote memory unclear.  Normal mood and affect. Well groomed.  Good eye contact.  Cardiovascular: overall no swelling, no varicosities, no edema bilaterally, normal  temperatures of the legs and arms, no clubbing, cyanosis and good capillary refill.  Right shoulder has pain with decreased ROM.  NV intact.  The mid thoracic spine is tender.  She has no swelling or redness.  NV intact.  Lymphatic: palpation is normal.  All other systems reviewed and are negative   The patient has been educated about the nature of the problem(s) and counseled on treatment options.  The patient appeared to understand what I have discussed and is in agreement with it.  Encounter Diagnoses  Name Primary?  . Pain in thoracic spine Yes  . Chronic right shoulder pain    X-rays were done of the thoracic spine, reported separately.  I will get MRI.  PROCEDURE NOTE:  The patient request injection, verbal consent was obtained.  The right shoulder was prepped appropriately after time out was performed.   Sterile technique was observed and injection of 1 cc of Celestone 6 mg with several cc's of plain xylocaine. Anesthesia was provided by ethyl chloride and a 20-gauge needle was used to inject the shoulder area. A posterior approach was used.  The injection was tolerated well.  A band aid dressing was applied.  The patient was advised to apply ice later today and tomorrow to the injection sight as needed.   PLAN Call if any problems.  Precautions discussed.  Continue current medications.   Return to clinic 3 weeks   Electronically Signed Darreld Mclean, MD 5/10/20223:07 PM

## 2020-11-16 NOTE — Addendum Note (Signed)
Addended by: Arvilla Market on: 11/16/2020 03:21 PM   Modules accepted: Orders

## 2020-11-30 ENCOUNTER — Ambulatory Visit (HOSPITAL_COMMUNITY)
Admission: RE | Admit: 2020-11-30 | Discharge: 2020-11-30 | Disposition: A | Discharge status: Home / Self Care | Payer: Medicare Other | Source: Ambulatory Visit | Attending: Orthopaedic Surgery | Admitting: Orthopaedic Surgery

## 2020-11-30 DIAGNOSIS — M546 Pain in thoracic spine: Secondary | ICD-10-CM | POA: Diagnosis present

## 2020-11-30 DIAGNOSIS — G8929 Other chronic pain: Secondary | ICD-10-CM | POA: Diagnosis present

## 2020-12-02 ENCOUNTER — Ambulatory Visit: Payer: Medicare Other | Admitting: Orthopaedic Surgery

## 2020-12-07 ENCOUNTER — Ambulatory Visit: Payer: Medicare Other | Admitting: Orthopaedic Surgery

## 2020-12-07 ENCOUNTER — Encounter: Payer: Self-pay | Admitting: Orthopaedic Surgery

## 2020-12-07 ENCOUNTER — Telehealth: Payer: Self-pay | Admitting: Orthopaedic Surgery

## 2020-12-07 ENCOUNTER — Other Ambulatory Visit: Payer: Self-pay

## 2020-12-07 ENCOUNTER — Ambulatory Visit (INDEPENDENT_AMBULATORY_CARE_PROVIDER_SITE_OTHER): Payer: Medicare Other | Admitting: Orthopaedic Surgery

## 2020-12-07 DIAGNOSIS — M546 Pain in thoracic spine: Secondary | ICD-10-CM | POA: Diagnosis not present

## 2020-12-07 NOTE — Telephone Encounter (Signed)
Done/patient aware. 

## 2020-12-07 NOTE — Progress Notes (Signed)
Virtual Visit via Telephone Note  I connected with@ on 12/07/20 at  1:40 PM EDT by telephone and verified that I am speaking with the correct person using two identifiers.  Location: Patient: home Provider: [facility]   I discussed the limitations, risks, security and privacy concerns of performing an evaluation and management service by telephone and the availability of in person appointments. I also discussed with the patient that there may be a patient responsible charge related to this service. The patient expressed understanding and agreed to proceed.   History of Present Illness: Her husband is much worse and just entered Hospice care at home.  She cannot come to office.  She wanted to know results of MRI:   IMPRESSION: 1. Negative for compression fracture or other focal finding. 2. Less than typical thoracic spine degeneration for age. No neural impingement in the thoracic spine.  I have explained findings to her.  She has no fracture.  She has pain more after trying to move or lift husband.  I have told her to get a lift belt and have therapy or nurse explain to her how to use.  I also asked her to get instructions on how to turn her husband in bed to prevent bed sores and also how to turn without excess stress on her and her back.  I have explained about how to do [employer].    Observations/Objective: Per above.  Assessment and Plan: Encounter Diagnosis  Name Primary?  . Pain in thoracic spine Yes     Follow Up Instructions: To do exercises and get lift belt.  I will see as needed.  She cannot leave home now.  I can see later as needed.   I discussed the assessment and treatment plan with the patient. The patient was provided an opportunity to ask questions and all were answered. The patient agreed with the plan and demonstrated an understanding of the instructions.   The patient was advised to call back or seek an in-person evaluation if the  symptoms worsen or if the condition fails to improve as anticipated.  I provided 10 minutes of non-face-to-face time during this encounter.   Darreld Mclean, MD

## 2020-12-07 NOTE — Telephone Encounter (Signed)
Set it up 

## 2020-12-07 NOTE — Telephone Encounter (Signed)
Patient called to request to have her visit for MRI results as a virtual visit if possible - states her husband has taken a turn for the worse, and she cannot leave him. Apologizes for having to reschedule the last appointment(s). Please advise if okay for phone/virtual appointment today, 12/07/20.

## 2021-03-15 ENCOUNTER — Encounter: Payer: Self-pay | Admitting: Orthopaedic Surgery

## 2021-03-15 ENCOUNTER — Other Ambulatory Visit: Payer: Self-pay

## 2021-03-15 ENCOUNTER — Ambulatory Visit (INDEPENDENT_AMBULATORY_CARE_PROVIDER_SITE_OTHER): Payer: Medicare Other | Admitting: Orthopaedic Surgery

## 2021-03-15 DIAGNOSIS — M25511 Pain in right shoulder: Secondary | ICD-10-CM | POA: Diagnosis not present

## 2021-03-15 DIAGNOSIS — G8929 Other chronic pain: Secondary | ICD-10-CM | POA: Diagnosis not present

## 2021-03-15 NOTE — Progress Notes (Signed)
PROCEDURE NOTE:  The patient request injection, verbal consent was obtained.  The right shoulder was prepped appropriately after time out was performed.   Sterile technique was observed and injection of 1 cc of Celestone 6 mg with several cc's of plain xylocaine. Anesthesia was provided by ethyl chloride and a 20-gauge needle was used to inject the shoulder area. A posterior approach was used.  The injection was tolerated well.  A band aid dressing was applied.  The patient was advised to apply ice later today and tomorrow to the injection sight as needed.   Return in one month.  She may need MRI.  Call if any problem.  Precautions discussed.  Electronically Signed Darreld Mclean, MD 9/6/20223:49 PM

## 2021-04-12 ENCOUNTER — Ambulatory Visit: Payer: Medicare Other | Admitting: Orthopaedic Surgery

## 2021-04-19 ENCOUNTER — Other Ambulatory Visit: Payer: Self-pay

## 2021-04-19 ENCOUNTER — Encounter: Payer: Self-pay | Admitting: Orthopaedic Surgery

## 2021-04-19 ENCOUNTER — Ambulatory Visit: Payer: Medicare Other | Admitting: Orthopaedic Surgery

## 2021-04-19 ENCOUNTER — Ambulatory Visit (INDEPENDENT_AMBULATORY_CARE_PROVIDER_SITE_OTHER): Payer: Medicare Other | Admitting: Orthopaedic Surgery

## 2021-04-19 DIAGNOSIS — M25511 Pain in right shoulder: Secondary | ICD-10-CM

## 2021-04-19 DIAGNOSIS — G8929 Other chronic pain: Secondary | ICD-10-CM

## 2021-04-19 MED ORDER — HYDROCODONE-ACETAMINOPHEN 5-325 MG PO TABS: 1.0000 | ORAL_TABLET | ORAL | 0 refills | Status: AC | PRN

## 2021-04-19 NOTE — Progress Notes (Signed)
My shoulder is worse.  She says the right shoulder did well from the injection only for about a week at most.  She has pain with motion of the right shoulder. She has no numbness, no new trauma.  She has tried Tylenol and patches which help.  ROM of the right shoulder is good but pain in the extremes and with resisted abduction.  NV intact.  Encounter Diagnosis  Name Primary?   Chronic right shoulder pain Yes   I will get MRI of the right shoulder.  Return in three weeks.  I have reviewed the [place] Controlled Substance Reporting System web site prior to prescribing narcotic medicine for this patient.  Call if any problem.  Precautions discussed.  Electronically Signed Darreld Mclean, MD 10/11/202210:18 AM

## 2021-05-05 ENCOUNTER — Ambulatory Visit
Admission: EM | Admit: 2021-05-05 | Discharge: 2021-05-05 | Disposition: A | Discharge status: Home / Self Care | Payer: Medicare Other

## 2021-05-05 ENCOUNTER — Encounter: Payer: Self-pay | Admitting: Emergency Medicine

## 2021-05-05 ENCOUNTER — Other Ambulatory Visit: Payer: Self-pay

## 2021-05-05 DIAGNOSIS — J029 Acute pharyngitis, unspecified: Secondary | ICD-10-CM

## 2021-05-05 LAB — POCT INFLUENZA A/B
Influenza A, POC: NEGATIVE
Influenza B, POC: NEGATIVE

## 2021-05-05 MED ORDER — AMOXICILLIN 500 MG PO CAPS: 500.0000 mg | ORAL_CAPSULE | Freq: Three times a day (TID) | ORAL | 0 refills | Status: DC

## 2021-05-05 NOTE — ED Provider Notes (Signed)
[facility]    CSN: [id number] Arrival date & time: 05/05/21  1536      History   Chief Complaint Chief Complaint  Patient presents with   Sore Throat   Cough    HPI Anne Davenport is a 79 y.o. female.   Pt reports she is on the 3rd day of cefdiner.  Pt reports she had a sore throat and her doctor called in antibiotics for her.  Pt reports she has had diarrhea since yesterday,  Pt reports no improvement with antibiotics   The history is provided by the patient. No language interpreter was used.  Sore Throat This is a new problem. The problem occurs constantly. The problem has not changed since onset.Nothing aggravates the symptoms. She has tried nothing for the symptoms.  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   Screening for STD (sexually transmitted disease) 07/23/2015    Patient Active Problem List   Diagnosis Date Noted   Boil, vulva 01/30/2020   RLQ abdominal pain 10/23/2019   Yeast vaginitis 10/23/2019   Vaginal irritation 10/23/2019   Bladder pain 10/23/2019   Type 2 diabetes mellitus with diabetic neuropathy (HCC) 03/13/2019   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   Right knee pain 08/18/2015   Right shoulder pain 08/18/2015   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   Screening for STD (sexually transmitted disease) 07/23/2015   Bartholin cyst 05/20/2015   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, 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   Iatrogenic hypothyroidism 04/09/2015   High cholesterol 09/07/2014    Past Surgical History:  Procedure Laterality Date   ABDOMINAL HYSTERECTOMY      BLADDER REPAIR     COLONOSCOPY N/A 06/24/2015   Procedure: COLONOSCOPY;  Surgeon: Malissa Hippo, MD;  [facility]: [facility];  Service: Endoscopy;  Laterality: N/A;  730   ESOPHAGOGASTRODUODENOSCOPY N/A 08/14/2014   Procedure: ESOPHAGOGASTRODUODENOSCOPY (EGD);  Surgeon: Malissa Hippo, MD;  [facility];  Service: Endoscopy;  Laterality: N/A;  1:10   TONSILLECTOMY     WRIST SURGERY Left     OB History     Gravida  4   Para  3   Term      Preterm      AB  1   Living  2      SAB  1   IAB      Ectopic      Multiple      Live Births               Home Medications    Prior to Admission medications   Medication Sig Start Date End Date Taking? Authorizing Provider  amoxicillin (AMOXIL) 500 MG capsule Take 1 capsule (500 mg total) by mouth 3 (three) times daily. 05/05/21  Yes Elson Areas, PA-C  Calcium Carbonate-Vitamin D 600-400 MG-UNIT tablet Take 1 tablet by mouth.    Yes [provider]  cefdinir (OMNICEF) 300 MG capsule Take 300 mg by mouth 2 (two) times daily. 05/02/21  Yes [provider]  estradiol (CLIMARA - DOSED IN MG/24 HR) 0.1 mg/24hr  Place 1 patch onto the skin every Saturday.   Yes [provider]  levothyroxine (SYNTHROID, LEVOTHROID) 75 MCG tablet Take 75 mcg by mouth at bedtime.    Yes [provider]  LORazepam (ATIVAN) 0.5 MG tablet Take 1 tablet (0.5 mg total) by mouth 2 (two) times daily as needed for anxiety. Patient taking differently: Take 0.5 mg by mouth at bedtime. 08/14/14  Yes Rehman, Joline Maxcy, MD  lovastatin (MEVACOR) 20 MG tablet Take 20 mg by mouth at bedtime.  12/10/17  Yes [provider]  sertraline (ZOLOFT) 50 MG tablet Take 25 mg by mouth. Every other night   Yes [provider]  acetaminophen (TYLENOL) 500 MG tablet Take 500 mg by mouth every 6 (six) hours as needed for mild pain, moderate pain or headache.    [provider]  fluticasone (FLONASE) 50  MCG/ACT nasal spray Place 2 sprays into the nose daily as needed for rhinitis.     [provider]  Naproxen Sodium (ALEVE PO) Take 1 tablet by mouth as needed (back pain).     [provider]  tiZANidine (ZANAFLEX) 4 MG tablet One by mouth every night before bed as needed for spasm 11/08/20   Darreld Mclean, MD    Family History Family History  Problem Relation Age of Onset   Diabetes Mother    Hypertension Mother    Alcohol abuse Brother    Cancer Daughter        breast   Hodgkin's lymphoma Daughter    Hypertension Daughter    Heart disease Daughter    Diabetes Maternal Grandfather    Heart disease Maternal Grandfather    Other Paternal Grandfather        poor circulation    Social History Social History   Tobacco Use   Smoking status: Former    Packs/day: 0.50    Years: 10.00    Pack years: 5.00    Types: Cigarettes    Quit date: 07/09/1984    Years since quitting: 36.8   Smokeless tobacco: Never  Vaping Use   Vaping Use: Never used  Substance Use Topics   Alcohol use: Yes    Alcohol/week: 0.0 standard drinks    Comment: "once in a while"   Drug use: No     Allergies   Codeine, Molds & smuts, and Zocor [simvastatin]   Review of Systems Review of Systems  Respiratory:  Positive for cough.   All other systems reviewed and are negative.   Physical Exam Triage Vital Signs ED Triage Vitals [05/05/21 1654]  Enc Vitals Group     BP (!) 168/73     Pulse Rate 75     Resp 18     Temp 97.9 F (36.6 C)     Temp Source Oral     SpO2 97 %     Weight      Height      Head Circumference      Peak Flow      Pain Score 7     Pain Loc      Pain Edu?      Excl. in GC?    No data found.  Updated Vital Signs BP (!) 168/73 (BP Location: Right Arm)   Pulse 75   Temp 97.9 F (36.6 C) (Oral)   Resp 18   SpO2 97%   Visual Acuity Right Eye Distance:   Left Eye Distance:   Bilateral Distance:    Right Eye  Near:   Left Eye Near:     Bilateral Near:     Physical Exam Vitals and nursing note reviewed.  Constitutional:      Appearance: She is well-developed.  HENT:     Head: Normocephalic.     Right Ear: Tympanic membrane normal.     Left Ear: Tympanic membrane normal.     Mouth/Throat:     Pharynx: Pharyngeal swelling and posterior oropharyngeal erythema present.  Cardiovascular:     Rate and Rhythm: Normal rate.  Pulmonary:     Effort: Pulmonary effort is normal.  Abdominal:     General: There is no distension.  Musculoskeletal:        General: Normal range of motion.     Cervical back: Normal range of motion.  Neurological:     Mental Status: She is alert and oriented to person, place, and time.     UC Treatments / Results  Labs (all labs ordered are listed, but only abnormal results are displayed) Labs Reviewed  POCT INFLUENZA A/B   Flu negative.  Pt had negative home covid.   I advised pt to stop cefdiner.  I counseled pt on diarrhea and antibiotics.  I explained risk of cdiff.  Pt is requesting treatment with amoxicillin or a shot of antibiotics.  I am reluctant to treat with antibiotics as it is unclear what pt was being treated for or if she needed antibiotics.   EKG   Radiology No results found.  Procedures Procedures (including critical care time)  Medications Ordered in UC Medications - No data to display  Initial Impression / Assessment and Plan / UC Course  I have reviewed the triage vital signs and the nursing notes.  Pertinent labs & imaging results that were available during my care of the patient were reviewed by me and considered in my medical decision making (see chart for details).     MDM:   Final Clinical Impressions(s) / UC Diagnoses   Final diagnoses:  Acute pharyngitis, unspecified etiology     Discharge Instructions      Stop taking cefdiner.  If diarrhea persist your will need to see your Physicain for recheck.    ED Prescriptions     Medication Sig  Dispense Auth. Provider   amoxicillin (AMOXIL) 500 MG capsule Take 1 capsule (500 mg total) by mouth 3 (three) times daily. 30 capsule Elson Areas, [place]      PDMP not reviewed this encounter. An After Visit Summary was printed and given to the patient.    Elson Areas, [place] 05/05/21 1742

## 2021-05-05 NOTE — ED Triage Notes (Signed)
Patient c/o sore throat and cough x 4 days.   Patient endorses worsening symptoms and chest congestion.  Patient endorses sinus pressure upon waking this morning.   Patient was prescribed Cefdinir (patient started taking this medication on Monday) and patient is unable to take due to side effects. Patient states antibiotic didn't help symptoms.   Patient has taken Robitussin and Throat spray with some relief of symptoms.

## 2021-05-05 NOTE — Discharge Instructions (Addendum)
Stop taking cefdiner.  If diarrhea persist your will need to see your Physicain for recheck.

## 2021-05-06 ENCOUNTER — Ambulatory Visit (HOSPITAL_COMMUNITY): Payer: Medicare Other

## 2021-05-10 ENCOUNTER — Other Ambulatory Visit (HOSPITAL_COMMUNITY): Payer: Self-pay | Admitting: Family Medicine

## 2021-05-10 ENCOUNTER — Ambulatory Visit: Payer: Medicare Other | Admitting: Orthopaedic Surgery

## 2021-05-10 DIAGNOSIS — Z1231 Encounter for screening mammogram for malignant neoplasm of breast: Secondary | ICD-10-CM

## 2021-05-25 ENCOUNTER — Ambulatory Visit (HOSPITAL_COMMUNITY)
Admission: RE | Admit: 2021-05-25 | Discharge: 2021-05-25 | Disposition: A | Discharge status: Home / Self Care | Payer: Medicare Other | Source: Ambulatory Visit | Attending: Orthopaedic Surgery | Admitting: Orthopaedic Surgery

## 2021-05-25 ENCOUNTER — Other Ambulatory Visit: Payer: Self-pay

## 2021-05-25 DIAGNOSIS — G8929 Other chronic pain: Secondary | ICD-10-CM | POA: Insufficient documentation

## 2021-05-25 DIAGNOSIS — M25511 Pain in right shoulder: Secondary | ICD-10-CM | POA: Insufficient documentation

## 2021-05-31 ENCOUNTER — Encounter: Payer: Self-pay | Admitting: Orthopaedic Surgery

## 2021-05-31 ENCOUNTER — Other Ambulatory Visit: Payer: Self-pay

## 2021-05-31 ENCOUNTER — Ambulatory Visit (INDEPENDENT_AMBULATORY_CARE_PROVIDER_SITE_OTHER): Payer: Medicare Other | Admitting: Orthopaedic Surgery

## 2021-05-31 DIAGNOSIS — M25511 Pain in right shoulder: Secondary | ICD-10-CM

## 2021-05-31 DIAGNOSIS — G8929 Other chronic pain: Secondary | ICD-10-CM

## 2021-05-31 DIAGNOSIS — M75121 Complete rotator cuff tear or rupture of right shoulder, not specified as traumatic: Secondary | ICD-10-CM | POA: Diagnosis not present

## 2021-05-31 NOTE — Progress Notes (Signed)
My shoulder is still hurting.  She had MRI of the right shoulder showing: IMPRESSION: 1. A 10 mm full-thickness tear of the anterior aspect of the supraspinatus tendon with 9 mm of retraction. 2. Mild tendinosis of the infraspinatus tendon.  I have explained the findings to her.  I will have her see Dr. Dallas Schimke.    I have independently reviewed the MRI.    ROM of the right shoulder is good but painful in the extremes.  She has pain past 100 abduction.  NV intact.  Grips normal.  Encounter Diagnoses  Name Primary?   Chronic right shoulder pain Yes   Nontraumatic complete tear of right rotator cuff    PROCEDURE NOTE:  The patient request injection, verbal consent was obtained.  The right shoulder was prepped appropriately after time out was performed.   Sterile technique was observed and injection of 1 cc of DepoMedrol 40mg  with several cc's of plain xylocaine. Anesthesia was provided by ethyl chloride and a 20-gauge needle was used to inject the shoulder area. A posterior approach was used.  The injection was tolerated well.  A band aid dressing was applied.  The patient was advised to apply ice later today and tomorrow to the injection sight as needed.   She is going to to visit her daughter for Thanksgiving.  To see Dr. 06-17-1987.  Call if any problem.  Precautions discussed.  Electronically Signed Dallas Schimke, MD 11/22/20229:52 AM

## 2021-06-03 ENCOUNTER — Ambulatory Visit (HOSPITAL_COMMUNITY): Payer: Medicare Other

## 2021-06-06 ENCOUNTER — Ambulatory Visit (HOSPITAL_COMMUNITY)
Admission: RE | Admit: 2021-06-06 | Discharge: 2021-06-06 | Disposition: A | Discharge status: Home / Self Care | Payer: Medicare Other | Source: Ambulatory Visit | Attending: Family Medicine | Admitting: Family Medicine

## 2021-06-06 ENCOUNTER — Other Ambulatory Visit: Payer: Self-pay

## 2021-06-06 DIAGNOSIS — Z1231 Encounter for screening mammogram for malignant neoplasm of breast: Secondary | ICD-10-CM | POA: Insufficient documentation

## 2021-06-07 ENCOUNTER — Encounter: Payer: Self-pay | Admitting: Orthopedic Surgery

## 2021-06-07 ENCOUNTER — Ambulatory Visit (INDEPENDENT_AMBULATORY_CARE_PROVIDER_SITE_OTHER): Payer: Medicare Other | Admitting: Orthopedic Surgery

## 2021-06-07 VITALS — BP 137/79 | HR 70 | Ht 63.0 in | Wt 134.0 lb

## 2021-06-07 DIAGNOSIS — M75121 Complete rotator cuff tear or rupture of right shoulder, not specified as traumatic: Secondary | ICD-10-CM | POA: Diagnosis not present

## 2021-06-07 NOTE — Progress Notes (Signed)
New Patient Visit  Assessment: Anne Davenport is a 80 y.o. female with the following: 1. Nontraumatic complete tear of right rotator cuff  Plan: Patient has chronic pain in her right shoulder subsequent to a complete tear of the anterior aspect of the rotator cuff.  Currently, her pain and function is good following an injection a few weeks ago.  We reviewed the MRI in clinic today which demonstrates a relatively small, full-thickness tear of the anterior aspect of the rotator cuff.  The tear is complicated by the fact that it is mid substance, versus from the footprint of the rotator cuff.  In addition, there is retraction to the mid humeral head.  Due to the orientation of the tear, as well as her age overall, I am concerned that an attempted rotator cuff repair would subsequently fail.  As a result, if she is interested in surgery, I would recommend a reverse shoulder arthroplasty.  This was discussed in some detail in clinic today.  All of her questions were answered.  Due to the fact that her function and current pain is good overall, no further intervention is warranted at this time.  However, if her pain returns, and she is interested in proceeding with surgery, I have asked to return to clinic.   Follow-up: Return if symptoms worsen or fail to improve.  Subjective:  Chief Complaint  Patient presents with   Shoulder Pain    Rt shoulder pain.    History of Present Illness: Anne Davenport is a 80 y.o. female who presents for evaluation of right shoulder pain.  She has previously been evaluated by my partner, Dr. Hilda Lias.  She has had pain in the right shoulder for several months.  She said multiple injections in the right shoulder.  Some of been ineffective, lasting for less than a week.  However, she had an injection within the last few weeks, which has been very helpful in improving her pain and function.  Currently, her pain is tolerable.  She takes over-the-counter medications as needed.   She denies a specific injury to her right shoulder.  However, she notes that she is independent, and will routinely lift heavier objects at home.   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   Screening for STD (sexually transmitted disease) 07/23/2015    Past Surgical History:  Procedure Laterality Date   ABDOMINAL HYSTERECTOMY     BLADDER REPAIR     COLONOSCOPY N/A 06/24/2015   Procedure: COLONOSCOPY;  Surgeon: Malissa Hippo, MD;  Location: [facility];  Service: Endoscopy;  Laterality: N/A;  730   ESOPHAGOGASTRODUODENOSCOPY N/A 08/14/2014   Procedure: ESOPHAGOGASTRODUODENOSCOPY (EGD);  Surgeon: Malissa Hippo, MD;  Location: [facility];  Service: Endoscopy;  Laterality: N/A;  1:10   TONSILLECTOMY     WRIST SURGERY Left     Family History  Problem Relation Age of Onset   Diabetes  Mother    Hypertension Mother    Alcohol abuse Brother    Cancer Daughter        breast   Hodgkin's lymphoma Daughter    Hypertension Daughter    Heart disease Daughter    Diabetes Maternal Grandfather    Heart disease Maternal Grandfather    Other Paternal Grandfather        poor circulation   Social History   Tobacco Use   Smoking status: Former    Packs/day: 0.50    Years: 10.00    Pack years: 5.00    Types: Cigarettes    Quit date: 07/09/1984    Years since quitting: 36.9   Smokeless tobacco: Never  Vaping Use   Vaping Use: Never used  Substance Use  Topics   Alcohol use: Yes    Alcohol/week: 0.0 standard drinks    Comment: "once in a while"   Drug use: No    Allergies  Allergen Reactions   Codeine Itching and Nausea And Vomiting   Molds & Smuts Other (See Comments)    Reaction:  Itchy, watery eyes    Zocor [Simvastatin] Nausea And Vomiting and Rash    No outpatient medications have been marked as taking for the 06/07/21 encounter (Office Visit) with Oliver Barre, MD.    Objective: BP 137/79   Pulse 70   Ht 5\' 3"  (1.6 m)   Wt 134 lb (60.8 kg)   BMI 23.74 kg/m   Physical Exam:  General: Alert and oriented. and No acute distress. Gait: Normal gait.  Evaluation of the right shoulder demonstrates no deformity.  No atrophy is appreciated.  Forward elevation to 130 degrees with minimal discomfort.  Internal rotation to T12.  Mild discomfort in the empty can testing position.  Negative belly press.  Fingers are warm and well-perfused.  2+ radial pulse.   IMAGING: No new imaging obtained today   Right shoulder MRI  IMPRESSION: 1. A 10 mm full-thickness tear of the anterior aspect of the supraspinatus tendon with 9 mm of retraction. 2. Mild tendinosis of the infraspinatus tendon.    New Medications:  No orders of the defined types were placed in this encounter.     , MD  06/07/2021 11:43 AM

## 2021-08-09 ENCOUNTER — Encounter: Payer: Self-pay | Admitting: Orthopaedic Surgery

## 2021-08-09 ENCOUNTER — Other Ambulatory Visit: Payer: Self-pay

## 2021-08-09 ENCOUNTER — Ambulatory Visit (INDEPENDENT_AMBULATORY_CARE_PROVIDER_SITE_OTHER): Payer: Medicare Other | Admitting: Orthopaedic Surgery

## 2021-08-09 DIAGNOSIS — M7061 Trochanteric bursitis, right hip: Secondary | ICD-10-CM

## 2021-08-09 NOTE — Progress Notes (Signed)
PROCEDURE NOTE:  The patient request injection, verbal consent was obtained.  The right trochanteric area of the hip was prepped appropriately after time out was performed.   Sterile technique was observed and injection of 1 cc of DepoMedrol 40 mg with several cc's of plain xylocaine. Anesthesia was provided by ethyl chloride and a 20-gauge needle was used to inject the hip area. The injection was tolerated well.  A band aid dressing was applied.  The patient was advised to apply ice later today and tomorrow to the injection sight as needed.  Encounter Diagnosis  Name Primary?   Trochanteric bursitis, right hip Yes   I will see as needed.  Call if any problem.  Precautions discussed.  Electronically Signed Sanjuana Kava, MD 1/31/20233:15 PM

## 2021-09-21 ENCOUNTER — Ambulatory Visit: Payer: Medicare Other | Admitting: Adult Health

## 2021-10-26 ENCOUNTER — Other Ambulatory Visit (HOSPITAL_COMMUNITY): Payer: Self-pay | Admitting: Family Medicine

## 2021-10-26 DIAGNOSIS — Z780 Asymptomatic menopausal state: Secondary | ICD-10-CM

## 2021-11-01 ENCOUNTER — Ambulatory Visit (HOSPITAL_COMMUNITY)
Admission: RE | Admit: 2021-11-01 | Discharge: 2021-11-01 | Disposition: A | Discharge status: Home / Self Care | Payer: Medicare Other | Source: Ambulatory Visit | Attending: Family Medicine | Admitting: Family Medicine

## 2021-11-01 DIAGNOSIS — Z780 Asymptomatic menopausal state: Secondary | ICD-10-CM | POA: Diagnosis present

## 2021-11-08 ENCOUNTER — Encounter: Payer: Self-pay | Admitting: Orthopedic Surgery

## 2021-11-08 ENCOUNTER — Ambulatory Visit (INDEPENDENT_AMBULATORY_CARE_PROVIDER_SITE_OTHER): Payer: Medicare Other | Admitting: Orthopedic Surgery

## 2021-11-08 VITALS — Ht 63.0 in | Wt 134.0 lb

## 2021-11-08 DIAGNOSIS — M7582 Other shoulder lesions, left shoulder: Secondary | ICD-10-CM | POA: Diagnosis not present

## 2021-11-08 NOTE — Patient Instructions (Signed)

## 2021-11-09 ENCOUNTER — Encounter: Payer: Self-pay | Admitting: Orthopedic Surgery

## 2021-11-09 NOTE — Progress Notes (Addendum)
Orthopaedic Clinic Return ? ?Assessment: ?Anne Davenport is a 81 y.o. female with the following: ?Left shoulder rotator cuff tendinitis ? ? ?Plan: ?Anne Davenport has pain in the anterior aspect of the left shoulder.  She has difficulty with overhead motion.  No specific injury.  She has been working in her yard, but notes no falls.  No prior injuries.  We discussed treatment options including medications and an injection.  She would like to proceed with an injection.  This was completed in clinic today.  If the pain worsens, or she has a repeat flare, we will plan to obtain new imaging, with consideration for an MRI. ? ? ?Procedure note injection Left shoulder  ?  ?Verbal consent was obtained to inject the left shoulder, subacromial space ?Timeout was completed to confirm the site of injection.  The skin was prepped with alcohol and ethyl chloride was sprayed at the injection site.  ?A 21-gauge needle was used to inject 40 mg of Depo-Medrol and 1% lidocaine (3 cc) into the subacromial space of the left shoulder using a posterolateral approach.  ?There were no complications. A sterile bandage was applied. ? ? ?Follow-up: ?Return if symptoms worsen or fail to improve, for NO earlier than 3 months. ? ? ?Subjective: ? ?Chief Complaint  ?Patient presents with  ? left shoulder pain  ?  Atraumatic, no injury. 1-2 months ago   ? ? ?History of Present Illness: ?Anne Davenport is a 81 y.o. female who presents to clinic for evaluation of left shoulder pain.  I have previously seen her for right shoulder pain.  No prior injuries to her left shoulder.  She states that the pain started 1-2 months ago.  No specific activity or injury.  She states she was doing some yard work, and she started to notice pain in the anterior aspect of the left shoulder.  This has continued to bother her.  She has difficulty with overhead motion.  She is taking medications as needed. ? ?Review of Systems: ?No fevers or chills ?No numbness or tingling ?No  chest pain ?No shortness of breath ?No bowel or bladder dysfunction ?No GI distress ?No headaches ? ? ?Objective: ?Ht 5\' 3"  (1.6 m)   Wt 134 lb (60.8 kg)   BMI 23.74 kg/m?  ? ?Physical Exam: ? ?Alert and oriented.  No acute distress. ? ?Evaluation left shoulder demonstrates no deformity.  Pain and tenderness in the anterior aspect of the humerus.  Discomfort in the empty can testing position.  Forward flexion to approximately 120 degrees before it is too painful.  She is unable to get her hand behind her back.  She has some pain radiating distally.  Positive speeds testing. ? ?IMAGING: ?I personally ordered and reviewed the following images: ? ?No new imaging obtained today. ? ? ?Mordecai Rasmussen, MD ?11/09/2021 ?10:48 AM ? ? ?

## 2022-01-13 ENCOUNTER — Encounter: Payer: Self-pay | Admitting: Adult Health

## 2022-01-13 ENCOUNTER — Ambulatory Visit (INDEPENDENT_AMBULATORY_CARE_PROVIDER_SITE_OTHER): Payer: Medicare Other | Admitting: Adult Health

## 2022-01-13 VITALS — BP 142/85 | HR 75 | Ht 63.0 in | Wt 132.0 lb

## 2022-01-13 DIAGNOSIS — R300 Dysuria: Secondary | ICD-10-CM

## 2022-01-13 DIAGNOSIS — R350 Frequency of micturition: Secondary | ICD-10-CM

## 2022-01-13 DIAGNOSIS — R319 Hematuria, unspecified: Secondary | ICD-10-CM | POA: Diagnosis not present

## 2022-01-13 DIAGNOSIS — Z90710 Acquired absence of both cervix and uterus: Secondary | ICD-10-CM | POA: Diagnosis not present

## 2022-01-13 LAB — POCT URINALYSIS DIPSTICK
Glucose, UA: NEGATIVE
Ketones, UA: NEGATIVE
Leukocytes, UA: NEGATIVE
Nitrite, UA: NEGATIVE
Protein, UA: NEGATIVE

## 2022-01-13 MED ORDER — PHENAZOPYRIDINE HCL 200 MG PO TABS: 200.0000 mg | ORAL_TABLET | Freq: Three times a day (TID) | ORAL | 0 refills | Status: DC | PRN

## 2022-01-13 MED ORDER — SULFAMETHOXAZOLE-TRIMETHOPRIM 800-160 MG PO TABS: 1.0000 | ORAL_TABLET | Freq: Two times a day (BID) | ORAL | 0 refills | Status: DC

## 2022-01-13 NOTE — Progress Notes (Signed)
  Subjective:     Patient ID: Anne Davenport, female   DOB: 05/18/1941, 81 y.o.   MRN: [id number]  HPI Anne Davenport is a 81 year old white female, widowed, sp hysterectomy in complaining of burning with urination and frequency and pain low stomach. Had UTI in May, was treated by PCP.  PCP is Dr Sudie Bailey  Review of Systems Has burning at end of urine stream Has urinary frequency Has low stomach pain Had UTI in May Reviewed past medical,surgical, social and family history. Reviewed medications and allergies.      Objective:   Physical Exam BP (!) 142/85 (BP Location: Left Arm, Patient Position: Sitting, Cuff Size: Normal)   Pulse 75   Ht 5\' 3"  (1.6 m)   Wt 132 lb (59.9 kg)   BMI 23.38 kg/m  urine dipstick trace blood Skin warm and dry.Pelvic: external genitalia is normal in appearance no lesions, vagina: pale, with loss of rugae,urethra has no lesions or masses noted, cervix and uterus are absent,adnexa: no masses or tenderness noted. Bladder is  tender and no masses felt.  Fall risk is moderate  Upstream - 01/13/22 1146       Pregnancy Intention Screening   Does the patient want to become pregnant in the next year? N/A    Does the patient's partner want to become pregnant in the next year? N/A    Would the patient like to discuss contraceptive options today? N/A      Contraception Wrap Up   Current Method Female Sterilization   hyst   End Method Female Sterilization   hyst   Contraception Counseling Provided No            Examination chaperoned by 03/16/22 LPN     Assessment:    1. Hematuria, unspecified type Urine senf ro UA C&S - Urine Culture - Urinalysis, Routine w reflex microscopic - POCT Urinalysis Dipstick  2. Burning with urination Will rx Pyridium and septra ds  Meds ordered this encounter  Medications   sulfamethoxazole-trimethoprim (BACTRIM DS) 800-160 MG tablet    Sig: Take 1 tablet by mouth 2 (two) times daily. Take 1 bid    Dispense:  14 tablet     Refill:  0    Order Specific Question:   Supervising Provider    Answer:   Malachy Mood H [[id number]]   phenazopyridine (PYRIDIUM) 200 MG tablet    Sig: Take 1 tablet (200 mg total) by mouth 3 (three) times daily as needed for pain.    Dispense:  10 tablet    Refill:  0    Order Specific Question:   Supervising Provider    Answer:   Duane Lope H [[id number]]   Push fluids  - Urine Culture - Urinalysis, Routine w reflex microscopic - POCT Urinalysis Dipstick  3. Urinary frequency  4. S/P hysterectomy     Plan:     Follow up prn

## 2022-01-14 LAB — URINALYSIS, ROUTINE W REFLEX MICROSCOPIC
Bilirubin, UA: NEGATIVE
Glucose, UA: NEGATIVE
Ketones, UA: NEGATIVE
Leukocytes,UA: NEGATIVE
Nitrite, UA: NEGATIVE
Protein,UA: NEGATIVE
RBC, UA: NEGATIVE
Specific Gravity, UA: 1.013 (ref 1.005–1.030)
Urobilinogen, Ur: 0.2 mg/dL (ref 0.2–1.0)
pH, UA: 6 (ref 5.0–7.5)

## 2022-01-15 LAB — URINE CULTURE: Organism ID, Bacteria: NO GROWTH

## 2022-01-24 ENCOUNTER — Ambulatory Visit: Payer: Medicare Other | Admitting: Orthopaedic Surgery

## 2022-01-25 ENCOUNTER — Ambulatory Visit: Payer: Medicare Other | Admitting: Orthopaedic Surgery

## 2022-01-31 ENCOUNTER — Other Ambulatory Visit: Payer: Self-pay

## 2022-01-31 ENCOUNTER — Ambulatory Visit (INDEPENDENT_AMBULATORY_CARE_PROVIDER_SITE_OTHER): Payer: Medicare Other

## 2022-01-31 ENCOUNTER — Encounter: Payer: Self-pay | Admitting: Orthopaedic Surgery

## 2022-01-31 ENCOUNTER — Ambulatory Visit (INDEPENDENT_AMBULATORY_CARE_PROVIDER_SITE_OTHER): Payer: Medicare Other | Admitting: Orthopaedic Surgery

## 2022-01-31 VITALS — BP 132/71 | HR 83 | Ht 63.0 in | Wt 130.0 lb

## 2022-01-31 DIAGNOSIS — M25561 Pain in right knee: Secondary | ICD-10-CM

## 2022-01-31 DIAGNOSIS — G8929 Other chronic pain: Secondary | ICD-10-CM

## 2022-01-31 DIAGNOSIS — M7061 Trochanteric bursitis, right hip: Secondary | ICD-10-CM

## 2022-01-31 MED ORDER — HYDROCODONE-ACETAMINOPHEN 5-325 MG PO TABS: ORAL_TABLET | ORAL | 0 refills | Status: DC

## 2022-01-31 NOTE — Progress Notes (Signed)
My right knee gives way and my right hip is hurting again.  She has had giving way of the right knee over the last month. She has no trauma. She has popping and swelling.  It hurts more medially.  She has recurrence of the right hip trochanteric bursa pain again.  Nothing has helped it.  Right knee has medial pain, crepitus and slight effusion, ROM 0 to 105, positive medial McMurray, limp right, NV intact, no distal edema.  Right hip has marked pain over the right trochanteric bursa, no swelling, no redness.  Encounter Diagnoses  Name Primary?   Chronic pain of right knee Yes   Trochanteric bursitis, right hip    PROCEDURE NOTE:  The patient request injection, verbal consent was obtained.  The right trochanteric area of the hip was prepped appropriately after time out was performed.   Sterile technique was observed and injection of 1 cc of DepoMedrol 40mg  with several cc's of plain xylocaine. Anesthesia was provided by ethyl chloride and a 20-gauge needle was used to inject the hip area. The injection was tolerated well.  A band aid dressing was applied.  The patient was advised to apply ice later today and tomorrow to the injection sight as needed.  I will get MRI of the right knee to rule out meniscus tear.  Return in two weeks.  I have reviewed the Controlled Substance Reporting System web site prior to prescribing narcotic medicine for this patient.  Call if any problem.  Precautions discussed.  Electronically Signed [place], MD 7/25/20239:29 AM

## 2022-02-14 ENCOUNTER — Ambulatory Visit: Payer: Medicare Other | Admitting: Orthopaedic Surgery

## 2022-02-20 ENCOUNTER — Ambulatory Visit (HOSPITAL_COMMUNITY)
Admission: RE | Admit: 2022-02-20 | Discharge: 2022-02-20 | Disposition: A | Discharge status: Home / Self Care | Payer: Medicare Other | Source: Ambulatory Visit | Attending: Orthopaedic Surgery | Admitting: Orthopaedic Surgery

## 2022-02-20 DIAGNOSIS — M25561 Pain in right knee: Secondary | ICD-10-CM | POA: Insufficient documentation

## 2022-02-20 DIAGNOSIS — G8929 Other chronic pain: Secondary | ICD-10-CM | POA: Insufficient documentation

## 2022-02-22 ENCOUNTER — Ambulatory Visit (INDEPENDENT_AMBULATORY_CARE_PROVIDER_SITE_OTHER): Payer: Medicare Other | Admitting: Orthopaedic Surgery

## 2022-02-22 ENCOUNTER — Encounter: Payer: Self-pay | Admitting: Orthopaedic Surgery

## 2022-02-22 DIAGNOSIS — G8929 Other chronic pain: Secondary | ICD-10-CM

## 2022-02-22 DIAGNOSIS — M25561 Pain in right knee: Secondary | ICD-10-CM | POA: Diagnosis not present

## 2022-02-22 NOTE — Progress Notes (Signed)
My knee is still sore.  She has pain of the right knee.  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  I have explained the findings of the MRI.  I do not recommend any surgery.  I have independently reviewed the MRI.    Right knee has pain laterally, stable, ROM 0 to 110, slight limp right, NV intact, no distal edema.  Encounter Diagnosis  Name Primary?   Chronic pain of right knee Yes   PROCEDURE NOTE:  The patient requests injections of the right knee , verbal consent was obtained.  The right knee was prepped appropriately after time out was performed.   Sterile technique was observed and injection of 1 cc of DepoMedrol 40mg  with several cc's of plain xylocaine. Anesthesia was provided by ethyl chloride and a 20-gauge needle was used to inject the knee area. The injection was tolerated well.  A band aid dressing was applied.  The patient was advised to apply ice later today and tomorrow to the injection sight as needed.  I will see as needed.  She is going to to see daughter.  Call if any problem.  Precautions discussed.  Electronically Signed [employer], MD 8/16/202310:21 AM

## 2022-03-11 ENCOUNTER — Encounter (HOSPITAL_COMMUNITY): Payer: Self-pay

## 2022-03-11 ENCOUNTER — Emergency Department (HOSPITAL_COMMUNITY): Payer: Medicare Other

## 2022-03-11 ENCOUNTER — Emergency Department (HOSPITAL_COMMUNITY)
Admission: EM | Admit: 2022-03-11 | Discharge: 2022-03-11 | Disposition: A | Discharge status: Home / Self Care | Payer: Medicare Other | Attending: Emergency Medicine | Admitting: Emergency Medicine

## 2022-03-11 ENCOUNTER — Other Ambulatory Visit: Payer: Self-pay

## 2022-03-11 DIAGNOSIS — Z87891 Personal history of nicotine dependence: Secondary | ICD-10-CM | POA: Diagnosis not present

## 2022-03-11 DIAGNOSIS — I10 Essential (primary) hypertension: Secondary | ICD-10-CM | POA: Diagnosis not present

## 2022-03-11 DIAGNOSIS — S0083XA Contusion of other part of head, initial encounter: Secondary | ICD-10-CM | POA: Insufficient documentation

## 2022-03-11 DIAGNOSIS — T148XXA Other injury of unspecified body region, initial encounter: Secondary | ICD-10-CM

## 2022-03-11 DIAGNOSIS — S8992XA Unspecified injury of left lower leg, initial encounter: Secondary | ICD-10-CM | POA: Diagnosis present

## 2022-03-11 DIAGNOSIS — S81812A Laceration without foreign body, left lower leg, initial encounter: Secondary | ICD-10-CM | POA: Diagnosis not present

## 2022-03-11 DIAGNOSIS — S0990XA Unspecified injury of head, initial encounter: Secondary | ICD-10-CM

## 2022-03-11 MED ORDER — LIDOCAINE-EPINEPHRINE-TETRACAINE (LET) TOPICAL GEL
3.0000 mL | Freq: Once | TOPICAL | Status: AC
  Administered 2022-03-11: 3 mL via TOPICAL
  Filled 2022-03-11: qty 3

## 2022-03-11 MED ORDER — BACITRACIN ZINC 500 UNIT/GM EX OINT
TOPICAL_OINTMENT | CUTANEOUS | Status: AC
  Filled 2022-03-11: qty 0.9
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
## 2022-03-11 NOTE — ED Notes (Signed)
Dressing placed to left lower leg per EDP.

## 2022-03-11 NOTE — Discharge Instructions (Signed)
Keep dressings in place for 2 days.  Then can remove and apply antibiotic ointment after washing gently with soap and water and then redress.  Staple removal and 7 days from the left leg.  Can follow-up with your primary care doctor have this done.  CT head neck and face without any acute findings.  But it does not rule out concussion hopefully you will not develop concussive symptoms but if you do follow-up with your doctor.

## 2022-03-15 ENCOUNTER — Other Ambulatory Visit (HOSPITAL_COMMUNITY): Payer: Self-pay | Admitting: Family Medicine

## 2022-03-15 DIAGNOSIS — Z1231 Encounter for screening mammogram for malignant neoplasm of breast: Secondary | ICD-10-CM

## 2022-05-04 ENCOUNTER — Encounter: Payer: Medicare Other | Admitting: Orthopaedic Surgery

## 2022-05-09 ENCOUNTER — Telehealth: Payer: Self-pay | Admitting: Orthopaedic Surgery

## 2022-05-09 ENCOUNTER — Encounter: Payer: Medicare Other | Admitting: Orthopaedic Surgery

## 2022-05-09 NOTE — Telephone Encounter (Signed)
Voice message received 05/09/22 requesting call back - states has question for Dr Luna Glasgow. Aware she is to reschedule appointment from today, 05/09/22.

## 2022-05-18 ENCOUNTER — Ambulatory Visit (INDEPENDENT_AMBULATORY_CARE_PROVIDER_SITE_OTHER): Payer: Medicare Other | Admitting: Orthopaedic Surgery

## 2022-05-18 ENCOUNTER — Encounter: Payer: Self-pay | Admitting: Orthopaedic Surgery

## 2022-05-18 ENCOUNTER — Ambulatory Visit (INDEPENDENT_AMBULATORY_CARE_PROVIDER_SITE_OTHER): Payer: Medicare Other

## 2022-05-18 VITALS — BP 129/75 | HR 77 | Ht 63.0 in | Wt 131.0 lb

## 2022-05-18 DIAGNOSIS — M545 Low back pain, unspecified: Secondary | ICD-10-CM | POA: Diagnosis not present

## 2022-05-18 MED ORDER — TIZANIDINE HCL 4 MG PO TABS: ORAL_TABLET | ORAL | 3 refills | Status: DC

## 2022-05-18 MED ORDER — PREDNISONE 5 MG (21) PO TBPK: ORAL_TABLET | ORAL | 0 refills | Status: DC

## 2022-05-18 MED ORDER — HYDROCODONE-ACETAMINOPHEN 5-325 MG PO TABS: ORAL_TABLET | ORAL | 0 refills | Status: DC

## 2022-05-18 NOTE — Progress Notes (Signed)
My back hurts bad.  She went to [facility] and lifted a 30 pound bag of Lime and put it into her cart about a month ago.  It was way too heavy for her.  She developed lower back pain, more on the left, that has persisted and even gotten worse.  She has pain to the left hip and thigh but not below.  She has tried heat, ice, rubs, Tylenol with no help. She is not getting better.  Her daughter is in town now and can help.  Spine/Pelvis examination:  Inspection:  Overall, sacoiliac joint benign and hips nontender; without crepitus or defects.   Thoracic spine inspection: Alignment normal without kyphosis present   Lumbar spine inspection:  Alignment  with normal lumbar lordosis, without scoliosis apparent.   Thoracic spine palpation:  without tenderness of spinal processes   Lumbar spine palpation: with tenderness of lumbar area; with tightness of lumbar muscles    Range of Motion:   Lumbar flexion, forward flexion is 10 with pain or tenderness    Lumbar extension is 5 with pain or tenderness   Left lateral bend is Abnormal- 10   with pain or tenderness   Right lateral bend is Abnormal- 10  with pain or tenderness   Straight leg raising is Normal   Strength & tone: Normal   Stability overall normal stability    X-rays were done of the lumbar spine, reported separately.  Encounter Diagnosis  Name Primary?   Acute bilateral low back pain without sciatica Yes   I will begin Zanaflex, prednisone dose pack and pain medicine.  I have reviewed the [place] Controlled Substance Reporting System web site prior to prescribing narcotic medicine for this patient.  Return in one week.  She may need MRI of the lumbar spine.  Call if any problem.  Precautions discussed.  Electronically Signed Darreld Mclean, MD 11/9/202311:19 AM

## 2022-05-18 NOTE — Patient Instructions (Addendum)
As the weather changes and gets cooler, you may notice you are affected more. You may have more pain in your joints. This is normal. Dress warmly and make sure that area is covered well.   FOLLOW UP IN 1 WEEK  TAKE YOUR MEDICATION AS DIRECTED. PREDNISONE-YOU WILL DECREASE THE NUMBER OF TABLETS YOU TAKE DAILY BY 1 (6,5,4,3,2,1)  THE MUSCLE RELAXER CAN MAKE YOU VERY DROWSY. START BY TAKING IT AT NIGHT  PAIN MEDICATION TAKE AS DIRECTED  ICE YOUR BACK SEVERAL TIMES A DAY  WHEN USING A HEATING PAD, DO NOT USE MORE THAN 30 MINS AT A TIME. DO NOT GO TO SLEEP WITH THE HEATING PAD ON.

## 2022-05-25 ENCOUNTER — Ambulatory Visit: Payer: Medicare Other | Admitting: Orthopaedic Surgery

## 2022-05-30 ENCOUNTER — Ambulatory Visit: Payer: Medicare Other | Admitting: Orthopaedic Surgery

## 2022-06-06 ENCOUNTER — Ambulatory Visit: Payer: Medicare Other | Admitting: Orthopaedic Surgery

## 2022-06-08 ENCOUNTER — Ambulatory Visit (HOSPITAL_COMMUNITY)
Admission: RE | Admit: 2022-06-08 | Discharge: 2022-06-08 | Disposition: A | Discharge status: Home / Self Care | Payer: Medicare Other | Source: Ambulatory Visit | Attending: Family Medicine | Admitting: Family Medicine

## 2022-06-08 DIAGNOSIS — Z1231 Encounter for screening mammogram for malignant neoplasm of breast: Secondary | ICD-10-CM | POA: Insufficient documentation

## 2022-06-22 ENCOUNTER — Encounter: Payer: Self-pay | Admitting: Orthopaedic Surgery

## 2022-06-22 ENCOUNTER — Ambulatory Visit (INDEPENDENT_AMBULATORY_CARE_PROVIDER_SITE_OTHER): Payer: Medicare Other | Admitting: Orthopaedic Surgery

## 2022-06-22 VITALS — BP 136/90

## 2022-06-22 DIAGNOSIS — G8929 Other chronic pain: Secondary | ICD-10-CM

## 2022-06-22 DIAGNOSIS — M25511 Pain in right shoulder: Secondary | ICD-10-CM | POA: Diagnosis not present

## 2022-06-22 MED ORDER — METHYLPREDNISOLONE ACETATE 40 MG/ML IJ SUSP
40.0000 mg | Freq: Once | INTRAMUSCULAR | Status: AC
  Administered 2022-06-22: 40 mg via INTRA_ARTICULAR

## 2022-06-22 NOTE — Progress Notes (Signed)
PROCEDURE NOTE:  The patient request injection, verbal consent was obtained.  The right shoulder was prepped appropriately after time out was performed.   Sterile technique was observed and injection of 1 cc of DepoMedrol 40mg  with several cc's of plain xylocaine. Anesthesia was provided by ethyl chloride and a 20-gauge needle was used to inject the shoulder area. A posterior approach was used.  The injection was tolerated well.  A band aid dressing was applied.  The patient was advised to apply ice later today and tomorrow to the injection sight as needed.  Encounter Diagnosis  Name Primary?   Chronic right shoulder pain Yes   I will see prn.  Call if any problem.  Precautions discussed.  Electronically Signed , MD 12/14/20239:02 AM

## 2022-06-22 NOTE — Addendum Note (Signed)
Addended by: Recardo Evangelist A on: 06/22/2022 09:32 AM   Modules accepted: Orders

## 2022-07-11 ENCOUNTER — Ambulatory Visit (INDEPENDENT_AMBULATORY_CARE_PROVIDER_SITE_OTHER): Payer: Medicare Other | Admitting: Orthopaedic Surgery

## 2022-07-11 ENCOUNTER — Telehealth: Payer: Self-pay | Admitting: Orthopaedic Surgery

## 2022-07-11 ENCOUNTER — Encounter: Payer: Self-pay | Admitting: Orthopaedic Surgery

## 2022-07-11 ENCOUNTER — Ambulatory Visit: Payer: Medicare Other | Admitting: Orthopaedic Surgery

## 2022-07-11 ENCOUNTER — Ambulatory Visit (INDEPENDENT_AMBULATORY_CARE_PROVIDER_SITE_OTHER): Payer: Medicare Other

## 2022-07-11 VITALS — BP 149/82 | HR 72 | Ht 63.0 in | Wt 131.0 lb

## 2022-07-11 DIAGNOSIS — G8929 Other chronic pain: Secondary | ICD-10-CM

## 2022-07-11 DIAGNOSIS — M25511 Pain in right shoulder: Secondary | ICD-10-CM

## 2022-07-11 NOTE — Patient Instructions (Signed)
Central Scheduling [phone]  While we are working on your approval please go ahead and call to schedule your appointment to be done within one week. If you can not get an appointment at [facility] within the next week, ask if they have something sooner at [facility] or [facility] if you are able to go to [facility] to have the imaging done.  AFTER you have made your imaging appointment, please call our office back at [phone] to schedule an appointment to review your results.   

## 2022-07-11 NOTE — Telephone Encounter (Signed)
Patient called, lvm that she couldn't get a MRI until the end of January, she just wanted Dr. Luna Glasgow to be aware.

## 2022-07-11 NOTE — Progress Notes (Signed)
My shoulder hurt  She went to see her daughter in [place] over the holidays.  She was sleeping on a pull out sofa.  As she was adjusting one of the pillows, she felt a pop in her right shoulder.  She had pain for three days but now it is better.  She has popping.  She had no numbness or redness.  ROM of the right shoulder is limited secondary to pain with forward flexion to 90, abduction to 75.  NV intact.  Encounter Diagnosis  Name Primary?   Chronic right shoulder pain Yes   X-rays of the right shoulder were done, reported separately.  I will get a MRI of the right shoulder.  Return in two weeks.  Call if any problem.  Precautions discussed.  Electronically Signed Sanjuana Kava, MD 1/2/20249:49 AM

## 2022-07-12 NOTE — Telephone Encounter (Signed)
Provider made aware.

## 2022-07-27 ENCOUNTER — Ambulatory Visit (INDEPENDENT_AMBULATORY_CARE_PROVIDER_SITE_OTHER): Payer: 59 | Admitting: Orthopaedic Surgery

## 2022-07-27 ENCOUNTER — Ambulatory Visit (INDEPENDENT_AMBULATORY_CARE_PROVIDER_SITE_OTHER): Payer: 59

## 2022-07-27 ENCOUNTER — Encounter: Payer: Self-pay | Admitting: Orthopaedic Surgery

## 2022-07-27 VITALS — BP 145/75 | HR 75 | Ht 63.0 in | Wt 134.0 lb

## 2022-07-27 DIAGNOSIS — M25562 Pain in left knee: Secondary | ICD-10-CM | POA: Diagnosis not present

## 2022-07-27 DIAGNOSIS — G8929 Other chronic pain: Secondary | ICD-10-CM

## 2022-07-27 MED ORDER — METHYLPREDNISOLONE ACETATE 40 MG/ML IJ SUSP
40.0000 mg | Freq: Once | INTRAMUSCULAR | Status: AC
  Administered 2022-07-27: 40 mg via INTRA_ARTICULAR

## 2022-07-27 NOTE — Progress Notes (Signed)
I have a new problem.  I have been seeing her for right shoulder pain and she has MRI scheduled for next week.  She developed marked left knee pain this week with medial pain, swelling and inability to walk well.  She has iced it, taken tylenol and it has gotten better.  It is giving way at times.  She has no trauma.  Left knee has slight effusion, crepitus, ROM 0 to 105, medial joint line pain, positive medial McMurray, limp left.  X-rays were done of the left knee, reported separately.  Encounter Diagnosis  Name Primary?   Chronic pain of left knee Yes   PROCEDURE NOTE:  The patient requests injections of the left knee , verbal consent was obtained.  The left knee was prepped appropriately after time out was performed.   Sterile technique was observed and injection of 1 cc of DepoMedrol 40 mg with several cc's of plain xylocaine. Anesthesia was provided by ethyl chloride and a 20-gauge needle was used to inject the knee area. The injection was tolerated well.  A band aid dressing was applied.  The patient was advised to apply ice later today and tomorrow to the injection sight as needed.  She may need MRI of the left knee.  Keep regular appointment already scheduled for the right shoulder.  Call if any problem.  Precautions discussed.  Electronically Signed Sanjuana Kava, MD 1/18/20249:07 AM

## 2022-07-27 NOTE — Addendum Note (Signed)
Addended by: Obie Dredge A on: 07/27/2022 09:09 AM   Modules accepted: Orders

## 2022-08-02 ENCOUNTER — Ambulatory Visit (HOSPITAL_COMMUNITY)
Admission: RE | Admit: 2022-08-02 | Discharge: 2022-08-02 | Disposition: A | Discharge status: Home / Self Care | Payer: 59 | Source: Ambulatory Visit | Attending: Orthopaedic Surgery | Admitting: Orthopaedic Surgery

## 2022-08-02 DIAGNOSIS — G8929 Other chronic pain: Secondary | ICD-10-CM | POA: Diagnosis present

## 2022-08-02 DIAGNOSIS — M25511 Pain in right shoulder: Secondary | ICD-10-CM | POA: Diagnosis not present

## 2022-08-08 ENCOUNTER — Encounter: Payer: Self-pay | Admitting: Orthopaedic Surgery

## 2022-08-08 ENCOUNTER — Ambulatory Visit (INDEPENDENT_AMBULATORY_CARE_PROVIDER_SITE_OTHER): Payer: 59 | Admitting: Orthopaedic Surgery

## 2022-08-08 VITALS — BP 128/70 | HR 75 | Ht 63.0 in | Wt 134.0 lb

## 2022-08-08 DIAGNOSIS — M25511 Pain in right shoulder: Secondary | ICD-10-CM

## 2022-08-08 DIAGNOSIS — G8929 Other chronic pain: Secondary | ICD-10-CM

## 2022-08-08 NOTE — Progress Notes (Signed)
I feel better.  Her right shoulder MRI showed: IMPRESSION: 1. No acute findings or signs of recent shoulder dislocation. The labrum appears grossly intact. 2. No significant change in full-thickness, partial width non insertional tear of the supraspinatus tendon. No focal muscular atrophy. 3. The additional components of the rotator cuff, biceps tendon and labrum appear intact. 4. Mild acromioclavicular and glenohumeral degenerative changes.   I have explained the findings to her.  I have independently reviewed the MRI.    Right shoulder has good ROM today with pain in extension.  NV intact.  Encounter Diagnosis  Name Primary?   Chronic right shoulder pain Yes   Return in one month.  Call if any problem.  Precautions discussed.  Electronically Signed Sanjuana Kava, MD 1/30/202410:25 AM

## 2022-09-07 ENCOUNTER — Encounter: Payer: Self-pay | Admitting: Radiology

## 2022-09-12 ENCOUNTER — Ambulatory Visit: Payer: 59 | Admitting: Orthopaedic Surgery

## 2022-11-03 ENCOUNTER — Ambulatory Visit (INDEPENDENT_AMBULATORY_CARE_PROVIDER_SITE_OTHER): Payer: 59

## 2022-11-03 ENCOUNTER — Ambulatory Visit
Admission: EM | Admit: 2022-11-03 | Discharge: 2022-11-03 | Disposition: A | Discharge status: Home / Self Care | Payer: 59

## 2022-11-03 DIAGNOSIS — S8011XA Contusion of right lower leg, initial encounter: Secondary | ICD-10-CM

## 2022-11-03 DIAGNOSIS — S81811A Laceration without foreign body, right lower leg, initial encounter: Secondary | ICD-10-CM | POA: Diagnosis not present

## 2022-11-03 NOTE — Discharge Instructions (Addendum)
Keep the dressing in place for the next 24 hours. The Steri-Strips applied today will fall off on their own.  Do not try to remove them. Keep the area clean and dry.  May clean with warm water.  May apply Neosporin or triple antibiotic ointment to the site as needed. May apply ice to help with pain and swelling.  Apply for 20 minutes, remove for 1 hour, then repeat as needed. Continue to monitor the area for possible signs of infection.  This includes increased redness, foul-smelling drainage, swelling, or if you develop fever, chills, or other concerns.  If the symptoms develop, recommend following up in this clinic or with your primary care physician as soon as possible for further evaluation. Follow-up as needed.

## 2022-11-03 NOTE — ED Provider Notes (Signed)
[facility]    CSN: [id number] Arrival date & time: 11/03/22  1050      History   Chief Complaint No chief complaint on file.   HPI Anne Davenport is a 82 y.o. female.   The history is provided by the patient.   The patient presents for complaints of pain to the right lower leg.  Patient states yesterday, she was getting a propane tank out of her trauma, when it fell onto the right leg.  Patient states that when the injury occurred, she had moderate bleeding to the right leg.  She states now she continues to have pain with walking and when she turns her leg from side-to-side.  She states that the tank fell onto the outside of the upper part of her leg.  Patient does have a skin tear to the area of her leg in which the tank fell.  She denies numbness, tingling, or inability to bear weight.  Patient denies any previous injury to the right knee or right lower leg.  Patient reports that she did dressed the wound and applied Neosporin, and has been taking Advil as needed for the pain. Tetanus shot is UTD.  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   Screening for STD (sexually transmitted disease) 07/23/2015    Patient Active Problem List   Diagnosis Date Noted   Burning with urination 01/13/2022   Hematuria 01/13/2022   Urinary frequency 01/13/2022   S/P hysterectomy 01/13/2022   Boil, vulva 01/30/2020   RLQ abdominal pain 10/23/2019   Yeast vaginitis 10/23/2019   Vaginal irritation 10/23/2019   Bladder pain 10/23/2019   Type 2 diabetes mellitus with diabetic neuropathy (HCC) 03/13/2019   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    Right knee pain 08/18/2015   Right shoulder pain 08/18/2015   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   Screening for STD (sexually transmitted disease) 07/23/2015   Bartholin cyst 05/20/2015   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, 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   Iatrogenic hypothyroidism 04/09/2015   High cholesterol 09/07/2014    Past Surgical History:  Procedure Laterality Date   ABDOMINAL HYSTERECTOMY     BLADDER REPAIR     COLONOSCOPY N/A 06/24/2015   Procedure: COLONOSCOPY;  Surgeon: Malissa Hippo, MD;  Location: [facility];  Service: Endoscopy;  Laterality: N/A;  730   ESOPHAGOGASTRODUODENOSCOPY N/A 08/14/2014   Procedure: ESOPHAGOGASTRODUODENOSCOPY (EGD);  Surgeon: Malissa Hippo, MD;  Location: [facility];  Service: Endoscopy;  Laterality: N/A;  1:10   TONSILLECTOMY     WRIST SURGERY Left     OB History     Gravida  4   Para  3   Term      Preterm      AB  1   Living  2      SAB  1   IAB      Ectopic      Multiple      Live Births               Home Medications    Prior to Admission medications  Medication Sig Start Date End Date Taking? Authorizing Provider  DOTTI 0.1 MG/24HR patch Place 1 patch onto the skin 2 (two) times a week. 09/20/22  Yes [provider]  acetaminophen (TYLENOL) 500 MG tablet Take 500 mg by mouth every 6 (six) hours as needed for mild pain, moderate pain or headache.    [provider]  Calcium Carbonate-Vitamin D 600-400 MG-UNIT tablet Take 1 tablet by mouth.     [provider]  Cholecalciferol (VITAMIN D3) 50 MCG (2000 UT) capsule Take 2,000 Units by mouth daily.    [provider]  estradiol (CLIMARA - DOSED IN MG/24 HR) 0.1 mg/24hr Place 1 patch onto the skin every Saturday.    [provider]  fluticasone (FLONASE) 50 MCG/ACT nasal spray Place 2 sprays into the nose daily as needed for rhinitis.     [provider]   HYDROcodone-acetaminophen (NORCO/VICODIN) 5-325 MG tablet One tablet every four hours as needed for acute pain.  Limit of five days per [place] statue. 05/18/22   Darreld Mclean, MD  levothyroxine (SYNTHROID, LEVOTHROID) 75 MCG tablet Take 75 mcg by mouth at bedtime.     [provider]  LORazepam (ATIVAN) 0.5 MG tablet Take 1 tablet (0.5 mg total) by mouth 2 (two) times daily as needed for anxiety. Patient taking differently: Take 0.5 mg by mouth at bedtime. 08/14/14   Malissa Hippo, MD  lovastatin (MEVACOR) 40 MG tablet Take 40 mg by mouth daily. 10/25/21   [provider]  Naproxen Sodium (ALEVE PO) Take 1 tablet by mouth as needed (back pain).     [provider]  tiZANidine (ZANAFLEX) 4 MG tablet One by mouth every night before bed as needed for spasm 05/18/22   Darreld Mclean, MD  venlafaxine XR (EFFEXOR-XR) 37.5 MG 24 hr capsule Take 37.5 mg by mouth daily. 11/07/21   [provider]    Family History Family History  Problem Relation Age of Onset   Diabetes Mother    Hypertension Mother    Alcohol abuse Brother    Cancer Daughter        breast   Hodgkin's lymphoma Daughter    Hypertension Daughter    Heart disease Daughter    Diabetes Maternal Grandfather    Heart disease Maternal Grandfather    Other Paternal Grandfather        poor circulation    Social History Social History   Tobacco Use   Smoking status: Former    Packs/day: 0.50    Years: 10.00    Additional pack years: 0.00    Total pack years: 5.00    Types: Cigarettes    Quit date: 07/09/1984    Years since quitting: 38.3   Smokeless tobacco: Never  Vaping Use   Vaping Use: Never used  Substance Use Topics   Alcohol use: Yes    Alcohol/week: 0.0 standard drinks of alcohol    Comment: "once in a while"   Drug use: No     Allergies   Codeine, Molds & smuts, and Zocor [simvastatin]   Review of Systems Review of Systems Per HPI  Physical Exam Triage Vital  Signs ED Triage Vitals  Enc Vitals Group     BP 11/03/22 1150 (!) 144/65     Pulse Rate 11/03/22 1150 77     Resp 11/03/22 1150 18     Temp 11/03/22 1150 97.7 F (36.5 C)     Temp Source 11/03/22 1150 Oral  SpO2 11/03/22 1150 98 %     Weight --      Height --      Head Circumference --      Peak Flow --      Pain Score 11/03/22 1153 6     Pain Loc --      Pain Edu? --      Excl. in GC? --    No data found.  Updated Vital Signs BP (!) 144/65 (BP Location: Right Arm)   Pulse 77   Temp 97.7 F (36.5 C) (Oral)   Resp 18   SpO2 98%   Visual Acuity Right Eye Distance:   Left Eye Distance:   Bilateral Distance:    Right Eye Near:   Left Eye Near:    Bilateral Near:     Physical Exam Vitals and nursing note reviewed.  Constitutional:      General: She is not in acute distress.    Appearance: Normal appearance.  Eyes:     Extraocular Movements: Extraocular movements intact.     Pupils: Pupils are equal, round, and reactive to light.  Pulmonary:     Effort: Pulmonary effort is normal.  Musculoskeletal:     Cervical back: Normal range of motion.     Right knee: Normal. Normal range of motion.     Right lower leg: No swelling or deformity. No edema.       Legs:  Skin:    General: Skin is warm and dry.     Findings: Wound present.       Neurological:     General: No focal deficit present.     Mental Status: She is alert and oriented to person, place, and time.  Psychiatric:        Mood and Affect: Mood normal.        Behavior: Behavior normal.      UC Treatments / Results  Labs (all labs ordered are listed, but only abnormal results are displayed) Labs Reviewed - No data to display  EKG   Radiology DG Tibia/Fibula Right  Result Date: 11/03/2022 CLINICAL DATA:  Pain after trauma.  Propane tank fell on leg EXAM: RIGHT TIBIA AND FIBULA - 2 VIEW COMPARISON:  None Available. FINDINGS: Osteopenia. No fracture or dislocation. Preserved adjacent joint  spaces. Only mild degenerative changes of the knee. IMPRESSION: Osteopenia.  No acute osseous abnormality Electronically Signed   By: Karen Kays M.D.   On: 11/03/2022 12:19    Procedures Procedures (including critical care time)  Medications Ordered in UC Medications - No data to display  Initial Impression / Assessment and Plan / UC Course  I have reviewed the triage vital signs and the nursing notes.  Pertinent labs & imaging results that were available during my care of the patient were reviewed by me and considered in my medical decision making (see chart for details).  The patient is well-appearing, she is in no acute distress, vital signs are stable.  X-ray of the right tib-fib is negative for fracture or dislocation.  Symptoms appear to be consistent with a contusion, along with a sprain to the right lower leg.  The skin tear to the right lower extremity was cleansed with wound cleanser and Steri-Strips were applied.  Patient was also provided a nonadherent dressing with bacitracin and Coban to keep in place.  Patient was advised that she can use ice to help with pain and swelling.  Patient also advised to continue to providing wound care.  Patient was given indications of when follow-up would be necessary.  Patient is in agreement with this plan of care and verbalizes understanding.  All questions were answered.  Patient stable for discharge.  Final Clinical Impressions(s) / UC Diagnoses   Final diagnoses:  Contusion of right lower leg, initial encounter  Skin tear of right lower leg without complication, initial encounter     Discharge Instructions      Keep the dressing in place for the next 24 hours. The Steri-Strips applied today will fall off on their own.  Do not try to remove them. Keep the area clean and dry.  May clean with warm water.  May apply Neosporin or triple antibiotic ointment to the site as needed. May apply ice to help with pain and swelling.  Apply for 20  minutes, remove for 1 hour, then repeat as needed. Continue to monitor the area for possible signs of infection.  This includes increased redness, foul-smelling drainage, swelling, or if you develop fever, chills, or other concerns.  If the symptoms develop, recommend following up in this clinic or with your primary care physician as soon as possible for further evaluation. Follow-up as needed.      ED Prescriptions   None    PDMP not reviewed this encounter.   Abran Cantor, NP 11/03/22 1239

## 2022-11-03 NOTE — ED Notes (Signed)
Applied bacitracin, non adherent gauze and co- band to the site of the wounds as instructed by provider, went over home care instructions with patient, she has verbalized understanding.

## 2022-11-03 NOTE — ED Triage Notes (Signed)
Pt c/o right leg pain, pt went to open the rear of CV trunk and a co2 tank hit the right leg, and caused pan and bleeding. X 1 day  Pt as able stop bleeding, cleaned wound and applied neosporin, pt wants to rule out fracture

## 2022-11-14 ENCOUNTER — Encounter: Payer: Self-pay | Admitting: Orthopaedic Surgery

## 2022-11-14 ENCOUNTER — Ambulatory Visit (INDEPENDENT_AMBULATORY_CARE_PROVIDER_SITE_OTHER): Payer: 59 | Admitting: Orthopaedic Surgery

## 2022-11-14 DIAGNOSIS — G8929 Other chronic pain: Secondary | ICD-10-CM

## 2022-11-14 DIAGNOSIS — M25511 Pain in right shoulder: Secondary | ICD-10-CM

## 2022-11-14 MED ORDER — HYDROCODONE-ACETAMINOPHEN 5-325 MG PO TABS: ORAL_TABLET | ORAL | 0 refills | Status: DC

## 2022-11-14 NOTE — Progress Notes (Signed)
PROCEDURE NOTE:  The patient request injection, verbal consent was obtained.  The right shoulder was prepped appropriately after time out was performed.   Sterile technique was observed and injection of 1 cc of DepoMedrol 40mg  with several cc's of plain xylocaine. Anesthesia was provided by ethyl chloride and a 20-gauge needle was used to inject the shoulder area. A posterior approach was used.  The injection was tolerated well.  A band aid dressing was applied.  The patient was advised to apply ice later today and tomorrow to the injection sight as needed.  Encounter Diagnosis  Name Primary?   Chronic right shoulder pain Yes   Return prn.  Call if any problem.  Precautions discussed.  Electronically Signed Darreld Mclean, MD 5/7/20241:59 PM

## 2022-12-05 ENCOUNTER — Ambulatory Visit
Admission: EM | Admit: 2022-12-05 | Discharge: 2022-12-05 | Disposition: A | Discharge status: Home / Self Care | Payer: 59 | Attending: Nurse Practitioner | Admitting: Nurse Practitioner

## 2022-12-05 ENCOUNTER — Ambulatory Visit (INDEPENDENT_AMBULATORY_CARE_PROVIDER_SITE_OTHER): Payer: 59

## 2022-12-05 DIAGNOSIS — J069 Acute upper respiratory infection, unspecified: Secondary | ICD-10-CM | POA: Insufficient documentation

## 2022-12-05 DIAGNOSIS — Z1152 Encounter for screening for COVID-19: Secondary | ICD-10-CM | POA: Diagnosis present

## 2022-12-05 DIAGNOSIS — J029 Acute pharyngitis, unspecified: Secondary | ICD-10-CM | POA: Diagnosis present

## 2022-12-05 LAB — POCT RAPID STREP A (OFFICE): Rapid Strep A Screen: NEGATIVE

## 2022-12-05 MED ORDER — PROMETHAZINE-DM 6.25-15 MG/5ML PO SYRP: 2.5000 mL | ORAL_SOLUTION | Freq: Every evening | ORAL | 0 refills | Status: DC | PRN

## 2022-12-05 NOTE — ED Triage Notes (Signed)
Pt reports she has some coughing with mucus, sore throat, body aches, chest tightness/pain that radiates to her mid back, hurts to breath and laying around/fatigue x 2 weeks.  Took tessalon pearl, levaflacin and hydrocodone cough syrup.

## 2022-12-05 NOTE — Discharge Instructions (Addendum)
You have a viral upper respiratory infection.  Symptoms should improve over the next week to 10 days.  If you develop chest pain or shortness of breath, go to the emergency room.  We have tested you today for COVID-19.  Rapid strep throat test today is negative.  You will see the results in Mychart and we will call you with positive results.  Please stay home and isolate until you are aware of the results.    Some things that can make you feel better are: - Increased rest - Increasing fluid with water/sugar free electrolytes - Acetaminophen and ibuprofen as needed for fever/pain - Salt water gargling, chloraseptic spray and throat lozenges for sore throat - OTC guaifenesin (Mucinex) 600 mg twice daily for congestion - Saline sinus flushes or a neti pot - Humidifying the air - Cough syrup at night time as needed for dry cough

## 2022-12-05 NOTE — ED Provider Notes (Signed)
[facility]    CSN: [id number] Arrival date & time: 12/05/22  1811      History   Chief Complaint No chief complaint on file.   HPI Anne Davenport is a 82 y.o. female.   Patient presents today for 2-day history of low-grade fevers, congested cough, shortness of breath and pain in her lungs with coughing and after coughing, chest tightness after coughing, stuffy nose, sore throat, right ear pain without drainage, decreased appetite, and fatigue.  Reports she had similar symptoms approximately 2 weeks ago that fully improved about 1 week ago.  Symptoms recurred 2 days ago.  Reports initially 2 weeks ago, her symptoms were treated with a round of amoxicillin and then Levaquin.  She was also given hydrocodone cough syrup and Tessalon Perles for coughing.  Today, she denies shortness of breath at rest, chest pain at rest, runny nose, headache, ear drainage, abdominal pain, nausea/vomiting, and diarrhea.  No known sick contacts.  Has been taking the [employer], hot tea, ginger ale, increasing water, and taking Aleve for symptoms with minimal improvement.     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   Screening for STD (sexually transmitted disease) 07/23/2015    Patient Active Problem List   Diagnosis Date Noted   Burning with urination 01/13/2022   Hematuria 01/13/2022   Urinary frequency 01/13/2022   S/P hysterectomy 01/13/2022   Boil, vulva 01/30/2020   RLQ abdominal pain 10/23/2019   Yeast vaginitis 10/23/2019   Vaginal irritation 10/23/2019   Bladder pain 10/23/2019   Type 2 diabetes mellitus with diabetic neuropathy  (HCC) 03/13/2019   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   Right knee pain 08/18/2015   Right shoulder pain 08/18/2015   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   Screening for STD (sexually transmitted disease) 07/23/2015   Bartholin cyst 05/20/2015   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, 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   Iatrogenic hypothyroidism 04/09/2015   High cholesterol 09/07/2014    Past Surgical History:  Procedure Laterality Date   ABDOMINAL HYSTERECTOMY     BLADDER REPAIR     COLONOSCOPY N/A 06/24/2015   Procedure: COLONOSCOPY;  Surgeon: Malissa Hippo, MD;  Location: [facility];  Service: Endoscopy;  Laterality: N/A;  730   ESOPHAGOGASTRODUODENOSCOPY N/A 08/14/2014   Procedure: ESOPHAGOGASTRODUODENOSCOPY (EGD);  Surgeon: Malissa Hippo, MD;  Location: [facility];  Service: Endoscopy;  Laterality: N/A;  1:10   TONSILLECTOMY     WRIST SURGERY Left     OB History     Gravida  4   Para  3   Term      Preterm      AB  1   Living  2      SAB  1   IAB      Ectopic      Multiple      Live Births               Home Medications    Prior to Admission medications   Medication Sig Start Date End Date Taking? Authorizing Provider  promethazine-dextromethorphan (PROMETHAZINE-DM) 6.25-15  MG/5ML syrup Take 2.5 mLs by mouth at bedtime as needed for cough. Do not take with alcohol or while driving or operating heavy machinery.  May cause drowsiness. 12/05/22  Yes Valentino Nose, NP  acetaminophen (TYLENOL) 500 MG tablet Take 500 mg by mouth every 6 (six) hours as needed for mild pain, moderate pain or headache.    [provider]  Calcium Carbonate-Vitamin D 600-400 MG-UNIT tablet Take 1 tablet by mouth.     [provider]  Cholecalciferol (VITAMIN D3) 50 MCG (2000 UT) capsule Take 2,000 Units by mouth daily.    [provider]  DOTTI 0.1 MG/24HR patch Place 1 patch onto the skin 2 (two) times a week. 09/20/22    [provider]  estradiol (CLIMARA - DOSED IN MG/24 HR) 0.1 mg/24hr Place 1 patch onto the skin every Saturday.    [provider]  fluticasone (FLONASE) 50 MCG/ACT nasal spray Place 2 sprays into the nose daily as needed for rhinitis.     [provider]  HYDROcodone-acetaminophen (NORCO/VICODIN) 5-325 MG tablet One tablet every four hours as needed for acute pain.  Limit of five days per [place]. 11/14/22   Darreld Mclean, MD  levothyroxine (SYNTHROID, LEVOTHROID) 75 MCG tablet Take 75 mcg by mouth at bedtime.     [provider]  LORazepam (ATIVAN) 0.5 MG tablet Take 1 tablet (0.5 mg total) by mouth 2 (two) times daily as needed for anxiety. Patient taking differently: Take 0.5 mg by mouth at bedtime. 08/14/14   Malissa Hippo, MD  lovastatin (MEVACOR) 40 MG tablet Take 40 mg by mouth daily. 10/25/21   [provider]  Naproxen Sodium (ALEVE PO) Take 1 tablet by mouth as needed (back pain).     [provider]  tiZANidine (ZANAFLEX) 4 MG tablet One by mouth every night before bed as needed for spasm 05/18/22   Darreld Mclean, MD  venlafaxine XR (EFFEXOR-XR) 37.5 MG 24 hr capsule Take 37.5 mg by mouth daily. 11/07/21   [provider]    Family History Family History  Problem Relation Age of Onset   Diabetes Mother    Hypertension Mother    Alcohol abuse Brother    Cancer Daughter        breast   Hodgkin's lymphoma Daughter    Hypertension Daughter    Heart disease Daughter    Diabetes Maternal Grandfather    Heart disease Maternal Grandfather    Other Paternal Grandfather        poor circulation    Social History Social History   Tobacco Use   Smoking status: Former    Packs/day: 0.50    Years: 10.00    Additional pack years: 0.00    Total pack years: 5.00    Types: Cigarettes    Quit date: 07/09/1984    Years since quitting: 38.4   Smokeless tobacco: Never  Vaping Use   Vaping Use: Never used   Substance Use Topics   Alcohol use: Yes    Alcohol/week: 0.0 standard drinks of alcohol    Comment: "once in a while"   Drug use: No     Allergies   Codeine, Molds & smuts, and Zocor [simvastatin]   Review of Systems Review of Systems Per HPI  Physical Exam Triage Vital Signs ED Triage Vitals  Enc Vitals Group     BP 12/05/22 1902 (!) 151/85     Pulse Rate 12/05/22 1902 87  Resp 12/05/22 1902 18     Temp 12/05/22 1902 99.3 F (37.4 C)     Temp Source 12/05/22 1902 Oral     SpO2 12/05/22 1902 96 %     Weight --      Height --      Head Circumference --      Peak Flow --      Pain Score 12/05/22 1903 5     Pain Loc --      Pain Edu? --      Excl. in GC? --    No data found.  Updated Vital Signs BP (!) 151/85 (BP Location: Right Arm)   Pulse 87   Temp 99.3 F (37.4 C) (Oral)   Resp 18   SpO2 96%   Visual Acuity Right Eye Distance:   Left Eye Distance:   Bilateral Distance:    Right Eye Near:   Left Eye Near:    Bilateral Near:     Physical Exam Vitals and nursing note reviewed.  Constitutional:      General: She is not in acute distress.    Appearance: Normal appearance. She is not ill-appearing or toxic-appearing.  HENT:     Head: Normocephalic and atraumatic.     Right Ear: Tympanic membrane, ear canal and external ear normal.     Left Ear: Tympanic membrane, ear canal and external ear normal.     Nose: No congestion or rhinorrhea.     Mouth/Throat:     Mouth: Mucous membranes are moist.     Pharynx: Oropharynx is clear. Posterior oropharyngeal erythema present. No oropharyngeal exudate.     Tonsils: No tonsillar exudate. 0 on the right. 0 on the left.  Eyes:     General: No scleral icterus.    Extraocular Movements: Extraocular movements intact.  Cardiovascular:     Rate and Rhythm: Normal rate and regular rhythm.  Pulmonary:     Effort: Pulmonary effort is normal. No respiratory distress.     Breath sounds: Normal breath sounds. No  wheezing, rhonchi or rales.  Abdominal:     General: Abdomen is flat. Bowel sounds are normal. There is no distension.     Palpations: Abdomen is soft.     Tenderness: There is no abdominal tenderness.  Musculoskeletal:     Cervical back: Normal range of motion and neck supple.  Lymphadenopathy:     Cervical: No cervical adenopathy.  Skin:    General: Skin is warm and dry.     Coloration: Skin is not jaundiced or pale.     Findings: No erythema or rash.  Neurological:     Mental Status: She is alert and oriented to person, place, and time.  Psychiatric:        Behavior: Behavior is cooperative.      UC Treatments / Results  Labs (all labs ordered are listed, but only abnormal results are displayed) Labs Reviewed  SARS CORONAVIRUS 2 (TAT 6-24 HRS)  POCT RAPID STREP A (OFFICE)    EKG   Radiology DG Chest 2 View  Result Date: 12/05/2022 CLINICAL DATA:  cough, malaise x 2 weeks EXAM: CHEST - 2 VIEW COMPARISON:  Chest x-ray 02/24/2013 FINDINGS: The heart and mediastinal contours are within normal limits. No focal consolidation. No pulmonary edema. No pleural effusion. No pneumothorax. No acute osseous abnormality. IMPRESSION: No active cardiopulmonary disease. Electronically Signed   By: Tish Frederickson M.D.   On: 12/05/2022 19:35    Procedures Procedures (including critical care  time)  Medications Ordered in UC Medications - No data to display  Initial Impression / Assessment and Plan / UC Course  I have reviewed the triage vital signs and the nursing notes.  Pertinent labs & imaging results that were available during my care of the patient were reviewed by me and considered in my medical decision making (see chart for details).   Patient is well-appearing, normotensive, afebrile, not tachycardic, not tachypneic, oxygenating well on room air.    1. Acute pharyngitis, unspecified etiology 2. Viral URI with cough 3. Encounter for screening for COVID-19 Suspect viral  etiology; vital signs and exam today reassuring Patient request strep throat test; this was negative COVID-19 test is pending Supportive care discussed with patient patient  ER return precautions discussed with patient  The patient was given the opportunity to ask questions.  All questions answered to their satisfaction.  The patient is in agreement to this plan.    Final Clinical Impressions(s) / UC Diagnoses   Final diagnoses:  Acute pharyngitis, unspecified etiology  Viral URI with cough  Encounter for screening for COVID-19     Discharge Instructions      You have a viral upper respiratory infection.  Symptoms should improve over the next week to 10 days.  If you develop chest pain or shortness of breath, go to the emergency room.  We have tested you today for COVID-19.  Rapid strep throat test today is negative.  You will see the results in Mychart and we will call you with positive results.  Please stay home and isolate until you are aware of the results.    Some things that can make you feel better are: - Increased rest - Increasing fluid with water/sugar free electrolytes - Acetaminophen and ibuprofen as needed for fever/pain - Salt water gargling, chloraseptic spray and throat lozenges for sore throat - OTC guaifenesin (Mucinex) 600 mg twice daily for congestion - Saline sinus flushes or a neti pot - Humidifying the air - Cough syrup at night time as needed for dry cough     ED Prescriptions     Medication Sig Dispense Auth. Provider   promethazine-dextromethorphan (PROMETHAZINE-DM) 6.25-15 MG/5ML syrup Take 2.5 mLs by mouth at bedtime as needed for cough. Do not take with alcohol or while driving or operating heavy machinery.  May cause drowsiness. 118 mL Valentino Nose, NP      PDMP not reviewed this encounter.   Valentino Nose, NP 12/06/22 1018

## 2022-12-07 LAB — SARS CORONAVIRUS 2 (TAT 6-24 HRS): SARS Coronavirus 2: NEGATIVE

## 2022-12-28 ENCOUNTER — Ambulatory Visit (INDEPENDENT_AMBULATORY_CARE_PROVIDER_SITE_OTHER): Payer: 59 | Admitting: Orthopaedic Surgery

## 2022-12-28 ENCOUNTER — Encounter: Payer: Self-pay | Admitting: Orthopaedic Surgery

## 2022-12-28 DIAGNOSIS — M25511 Pain in right shoulder: Secondary | ICD-10-CM | POA: Diagnosis not present

## 2022-12-28 DIAGNOSIS — G8929 Other chronic pain: Secondary | ICD-10-CM | POA: Diagnosis not present

## 2022-12-28 MED ORDER — METHYLPREDNISOLONE ACETATE 40 MG/ML IJ SUSP
40.0000 mg | Freq: Once | INTRAMUSCULAR | Status: AC
  Administered 2022-12-28: 40 mg via INTRA_ARTICULAR

## 2022-12-28 NOTE — Progress Notes (Signed)
PROCEDURE NOTE:  The patient request injection, verbal consent was obtained.  The right shoulder was prepped appropriately after time out was performed.   Sterile technique was observed and injection of 1 cc of DepoMedrol 40mg  with several cc's of plain xylocaine. Anesthesia was provided by ethyl chloride and a 20-gauge needle was used to inject the shoulder area. A posterior approach was used.  The injection was tolerated well.  A band aid dressing was applied.  The patient was advised to apply ice later today and tomorrow to the injection sight as needed.  Encounter Diagnosis  Name Primary?   Chronic right shoulder pain Yes   I will see prn.  Call if any problem.  Precautions discussed.  Electronically Signed Darreld Mclean, MD 6/20/20249:27 AM

## 2023-01-09 ENCOUNTER — Other Ambulatory Visit: Payer: Self-pay

## 2023-01-09 ENCOUNTER — Ambulatory Visit (INDEPENDENT_AMBULATORY_CARE_PROVIDER_SITE_OTHER): Payer: 59 | Admitting: Internal Medicine

## 2023-01-09 DIAGNOSIS — Z136 Encounter for screening for cardiovascular disorders: Secondary | ICD-10-CM

## 2023-01-12 NOTE — Progress Notes (Signed)
Order(s) created erroneously. Erroneous order ID: [id number]  Order moved by: Ian Malkin  Order move date/time: 01/12/2023 2:13 PM  Source Patient: [id number]  Source Contact: 01/09/2023  Destination Patient: [id number]  Destination Contact: 09/24/2012

## 2023-01-16 ENCOUNTER — Other Ambulatory Visit (INDEPENDENT_AMBULATORY_CARE_PROVIDER_SITE_OTHER): Payer: 59

## 2023-01-16 ENCOUNTER — Encounter: Payer: Self-pay | Admitting: Orthopaedic Surgery

## 2023-01-16 ENCOUNTER — Ambulatory Visit (INDEPENDENT_AMBULATORY_CARE_PROVIDER_SITE_OTHER): Payer: 59 | Admitting: Orthopaedic Surgery

## 2023-01-16 VITALS — BP 127/74 | HR 80 | Ht 63.0 in | Wt 127.0 lb

## 2023-01-16 DIAGNOSIS — G8929 Other chronic pain: Secondary | ICD-10-CM

## 2023-01-16 DIAGNOSIS — M25511 Pain in right shoulder: Secondary | ICD-10-CM | POA: Diagnosis not present

## 2023-01-16 NOTE — Progress Notes (Signed)
My shoulder had pain.  She had marked pain of the right shoulder two days ago.  It happened when she was packing.  She is planning to move soon.  The pain lasted a while.  She could not move the right shoulder much at all but the pain slowly lessened and she has gotten better.  She has motion today.  MRI in January of this year showed the chronic rotator cuff tear was unchanged.  I feel it may have changed slightly causing this pain.  ROM is good today, pain in the extremes with no effusion.  NV intact.  Grips normal. ROM neck is full.  Encounter Diagnosis  Name Primary?   Chronic right shoulder pain Yes   X-rays were done of the right shoulder, reported separately.  She will observe this for now.  She may need to see Dr. Dallas Schimke of this office if it gets worse.  ,Call if any problem.  Precautions discussed.  Electronically Signed Darreld Mclean, MD 7/9/202410:45 AM

## 2023-02-01 ENCOUNTER — Encounter: Payer: Self-pay | Admitting: Adult Health

## 2023-02-01 ENCOUNTER — Ambulatory Visit (INDEPENDENT_AMBULATORY_CARE_PROVIDER_SITE_OTHER): Payer: 59 | Admitting: Adult Health

## 2023-02-01 VITALS — BP 174/78 | HR 67 | Ht 63.0 in | Wt 129.0 lb

## 2023-02-01 DIAGNOSIS — R300 Dysuria: Secondary | ICD-10-CM

## 2023-02-01 DIAGNOSIS — B009 Herpesviral infection, unspecified: Secondary | ICD-10-CM

## 2023-02-01 DIAGNOSIS — R238 Other skin changes: Secondary | ICD-10-CM | POA: Diagnosis not present

## 2023-02-01 LAB — POCT URINALYSIS DIPSTICK
Glucose, UA: NEGATIVE
Ketones, UA: NEGATIVE
Leukocytes, UA: NEGATIVE
Nitrite, UA: NEGATIVE
Protein, UA: NEGATIVE

## 2023-02-01 MED ORDER — VALACYCLOVIR HCL 1 G PO TABS: 1000.0000 mg | ORAL_TABLET | Freq: Two times a day (BID) | ORAL | 1 refills | Status: DC

## 2023-02-01 NOTE — Progress Notes (Addendum)
  Subjective:     Patient ID: Anne Davenport, female   DOB: Jun 21, 1941, 82 y.o.   MRN: [id number]  HPI Anne Davenport is a 82 year old white female, widowed, sp hysterectomy in complaining of spot in vagina and burning with urination.  PCP is Dr Sudie Bailey.  Review of Systems +spot in vagina +burning with urination    Reviewed past medical,surgical, social and family history. Reviewed medications and allergies.  Objective:   Physical Exam BP (!) 174/78 (BP Location: Right Arm, Patient Position: Sitting, Cuff Size: Normal)   Pulse 67   Ht 5\' 3"  (1.6 m)   Wt 129 lb (58.5 kg)   BMI 22.85 kg/m  urine dipstick trace blood Skin warm and dry.Pelvic: external genitalia is normal in appearance, has vesicles left inner labia near introitus, culture obtained, vagina: pale,urethra has no lesions or masses noted, cervix and uterus are absent,adnexa: no masses or tenderness noted. Bladder is non tender and no masses felt.    Fall risk is low  Upstream - 02/01/23 1219       Pregnancy Intention Screening   Does the patient want to become pregnant in the next year? N/A    Does the patient's partner want to become pregnant in the next year? N/A    Would the patient like to discuss contraceptive options today? N/A      Contraception Wrap Up   Current Method Female Sterilization   hyst   End Method Female Sterilization   hyst   Contraception Counseling Provided No            Examination chaperoned by Malachy Mood LPN  Assessment:     1. Burning with urination Has burning with urination Trace blood on dipstick Will send urine for UA C&S to rule out UTI  - POCT Urinalysis Dipstick - Urine Culture - Urinalysis, Routine w reflex microscopic  2. Vesicles +vesicles left labia   3. Herpes Has vesicles left labia, culture sent  Will rx valtrex 1 gm 1 bid x 10 days Meds ordered this encounter  Medications   valACYclovir (VALTREX) 1000 MG tablet    Sig: Take 1 tablet (1,000 mg total) by mouth 2 (two)  times daily.    Dispense:  20 tablet    Refill:  1    Order Specific Question:   Supervising Provider    Answer:   Lazaro Arms [[id number]]       Plan:    Keep check on BP  Follow  up prn

## 2023-02-01 NOTE — Addendum Note (Signed)
Addended by: Colen Darling on: 02/01/2023 12:50 PM   Modules accepted: Orders

## 2023-02-08 ENCOUNTER — Ambulatory Visit: Payer: 59 | Admitting: Internal Medicine

## 2023-02-13 ENCOUNTER — Ambulatory Visit: Payer: 59 | Admitting: Orthopaedic Surgery

## 2023-02-22 ENCOUNTER — Ambulatory Visit (INDEPENDENT_AMBULATORY_CARE_PROVIDER_SITE_OTHER): Payer: 59 | Admitting: Internal Medicine

## 2023-02-22 ENCOUNTER — Encounter: Payer: Self-pay | Admitting: Internal Medicine

## 2023-02-22 VITALS — BP 138/76 | HR 72 | Ht 63.0 in | Wt 127.8 lb

## 2023-02-22 DIAGNOSIS — R079 Chest pain, unspecified: Secondary | ICD-10-CM | POA: Insufficient documentation

## 2023-02-22 DIAGNOSIS — M7918 Myalgia, other site: Secondary | ICD-10-CM

## 2023-02-22 DIAGNOSIS — E032 Hypothyroidism due to medicaments and other exogenous substances: Secondary | ICD-10-CM | POA: Diagnosis not present

## 2023-02-22 DIAGNOSIS — F320 Major depressive disorder, single episode, mild: Secondary | ICD-10-CM

## 2023-02-22 DIAGNOSIS — G4700 Insomnia, unspecified: Secondary | ICD-10-CM | POA: Insufficient documentation

## 2023-02-22 DIAGNOSIS — F329 Major depressive disorder, single episode, unspecified: Secondary | ICD-10-CM | POA: Insufficient documentation

## 2023-02-22 DIAGNOSIS — Z79899 Other long term (current) drug therapy: Secondary | ICD-10-CM

## 2023-02-22 DIAGNOSIS — E785 Hyperlipidemia, unspecified: Secondary | ICD-10-CM

## 2023-02-22 DIAGNOSIS — F5104 Psychophysiologic insomnia: Secondary | ICD-10-CM

## 2023-02-22 DIAGNOSIS — R7303 Prediabetes: Secondary | ICD-10-CM

## 2023-02-22 DIAGNOSIS — G8929 Other chronic pain: Secondary | ICD-10-CM | POA: Insufficient documentation

## 2023-02-22 NOTE — Assessment & Plan Note (Signed)
Mood stable.  Denies SI/HI.  She is currently prescribed venlafaxine 37.5 mg daily.  No medication changes are indicated today.

## 2023-02-22 NOTE — Assessment & Plan Note (Signed)
She endorses chronic pain in her right shoulder, left knee, and lumbar spine.  This is managed with as needed use of hydrocodone-acetaminophen 5-325 mg for severe pain relief.  PDMP reviewed and is appropriate.  This has most recently been managed by Dr. Hilda Lias (orthopedic surgery).  She states that she has only taken 1 pill in the last month.

## 2023-02-22 NOTE — Patient Instructions (Addendum)
It was a pleasure to see you today.  Thank you for giving [place] the opportunity to be involved in your care.  Below is a brief recap of your visit and next steps.  We will plan to see you again in 6 months.  Summary You have established care today We will check basic labs Controlled substance agreement signed, UDS ordered Follow up in 6 months   Schedule your Medicare Annual Wellness Visit at checkout.

## 2023-02-22 NOTE — Assessment & Plan Note (Signed)
She is currently prescribed lovastatin 40 mg daily.  Repeat lipid panel ordered today.

## 2023-02-22 NOTE — Assessment & Plan Note (Signed)
She takes at 0.5 mg nightly for insomnia.  PDMP reviewed and is appropriate.  Controlled substance agreement signed today.  UDS pending.

## 2023-02-22 NOTE — Progress Notes (Signed)
New Patient Office Visit  Subjective    Patient ID: Anne Davenport, female    DOB: August 28, 1940  Age: 82 y.o. MRN: [id number]  CC:  Chief Complaint  Patient presents with   Establish Care    HPI Anne Davenport presents to establish care.  She is an 82 year old woman who endorses a past medical history significant for hypothyroidism, HLD, MDD, insomnia, and chronic musculoskeletal pain.  Previously followed by Dr. Sudie Bailey.  [name] reports feeling well today.  She is asymptomatic currently and has no acute concerns to discuss aside from desiring to establish care.  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  She endorses a remote history of tobacco use and seldomly drinks alcohol.  Her family medical history is significant for prostate cancer in her father and CAD.  Chronic medical conditions and outstanding preventative care items discussed today are individually addressed A/P below.   Outpatient Encounter Medications as of 02/22/2023  Medication Sig   acetaminophen (TYLENOL) 500 MG tablet Take 500 mg by mouth every 6 (six) hours as needed for mild pain, moderate pain or headache.   Calcium Carbonate-Vitamin D 600-400 MG-UNIT tablet Take 1 tablet by mouth.    Cholecalciferol (VITAMIN D3) 50 MCG (2000 UT) capsule Take 2,000 Units by mouth daily.   DOTTI 0.1 MG/24HR patch Place 1 patch onto the skin 2 (two) times a week.   fluticasone (FLONASE) 50 MCG/ACT nasal spray Place 2 sprays into the nose daily as needed for rhinitis.    HYDROcodone-acetaminophen (NORCO/VICODIN) 5-325 MG tablet One tablet every four hours as needed for acute pain.  Limit of five days per [place] statue.   levothyroxine (SYNTHROID, LEVOTHROID) 75 MCG tablet Take 75 mcg by mouth at bedtime.    LORazepam (ATIVAN) 0.5 MG tablet Take 1 tablet (0.5 mg total) by mouth 2 (two) times daily as needed for anxiety. (Patient taking differently: Take 0.5 mg by mouth at  bedtime.)   lovastatin (MEVACOR) 40 MG tablet Take 40 mg by mouth daily.   Naproxen Sodium (ALEVE PO) Take 1 tablet by mouth as needed (back pain).    valACYclovir (VALTREX) 1000 MG tablet Take 1 tablet (1,000 mg total) by mouth 2 (two) times daily.   venlafaxine XR (EFFEXOR-XR) 37.5 MG 24 hr capsule Take 37.5 mg by mouth daily.   No facility-administered encounter medications on file as of 02/22/2023.    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   Screening for STD (sexually transmitted disease) 07/23/2015    Past Surgical History:  Procedure Laterality Date   ABDOMINAL HYSTERECTOMY     BLADDER REPAIR     COLONOSCOPY N/A 06/24/2015   Procedure: COLONOSCOPY;  Surgeon: Malissa Hippo, MD;  [facility];  Service: Endoscopy;  Laterality: N/A;  730   ESOPHAGOGASTRODUODENOSCOPY N/A 08/14/2014   Procedure: ESOPHAGOGASTRODUODENOSCOPY (EGD);  Surgeon: Malissa Hippo, MD;  [facility];  Service: Endoscopy;  Laterality: N/A;  1:10   TONSILLECTOMY     WRIST SURGERY Left     Family  History  Problem Relation Age of Onset   Diabetes Mother    Hypertension Mother    Alcohol abuse Brother    Cancer Daughter        breast   Hodgkin's lymphoma Daughter    Hypertension Daughter    Heart disease Daughter    Diabetes Maternal Grandfather    Heart disease Maternal Grandfather    Other Paternal Grandfather        poor circulation    Social History   Socioeconomic History   Marital status: Widowed    Spouse name: Not on file   Number of children: Not on file   Years of education: Not on file   Highest education level: Not  on file  Occupational History   Not on file  Tobacco Use   Smoking status: Former    Current packs/day: 0.00    Average packs/day: 0.5 packs/day for 10.0 years (5.0 ttl pk-yrs)    Types: Cigarettes    Start date: 07/09/1974    Quit date: 07/09/1984    Years since quitting: 38.6   Smokeless tobacco: Never  Vaping Use   Vaping status: Never Used  Substance and Sexual Activity   Alcohol use: Yes    Alcohol/week: 0.0 standard drinks of alcohol    Comment: "once in a while"   Drug use: No   Sexual activity: Not Currently    Birth control/protection: Surgical    Comment: hyst  Other Topics Concern   Not on file  Social History Narrative   Not on file   Social Determinants of Health   Financial Resource Strain: Not on file  Food Insecurity: Not on file  Transportation Needs: Not on file  Physical Activity: Not on file  Stress: Not on file  Social Connections: Not on file  Intimate Partner Violence: Not on file   Review of Systems  Constitutional:  Negative for chills and fever.  HENT:  Negative for sore throat.   Respiratory:  Negative for cough and shortness of breath.   Cardiovascular:  Negative for chest pain, palpitations and leg swelling.  Gastrointestinal:  Negative for abdominal pain, blood in stool, constipation, diarrhea, nausea and vomiting.  Genitourinary:  Negative for dysuria and hematuria.  Musculoskeletal:  Negative for myalgias.  Skin:  Negative for itching and rash.  Neurological:  Negative for dizziness and headaches.  Psychiatric/Behavioral:  Negative for depression and suicidal ideas.    Objective    BP 138/76   Pulse 72   Ht 5\' 3"  (1.6 m)   Wt 127 lb 12.8 oz (58 kg)   SpO2 96%   BMI 22.64 kg/m   Physical Exam Vitals reviewed.  Constitutional:      General: She is not in acute distress.    Appearance: Normal appearance. She is not toxic-appearing.  HENT:     Head: Normocephalic and atraumatic.     Right Ear: External ear normal.     Left  Ear: External ear normal.     Nose: Nose normal. No congestion or rhinorrhea.     Mouth/Throat:     Mouth: Mucous membranes are moist.     Pharynx: Oropharynx is clear. No oropharyngeal exudate or posterior oropharyngeal erythema.  Eyes:     General: No scleral icterus.    Extraocular Movements: Extraocular movements intact.     Conjunctiva/sclera: Conjunctivae normal.     Pupils: Pupils are equal, round, and reactive to light.  Cardiovascular:     Rate and Rhythm: Normal rate and  regular rhythm.     Pulses: Normal pulses.     Heart sounds: Normal heart sounds. No murmur heard.    No friction rub. No gallop.  Pulmonary:     Effort: Pulmonary effort is normal.     Breath sounds: Normal breath sounds. No wheezing, rhonchi or rales.  Abdominal:     General: Abdomen is flat. Bowel sounds are normal. There is no distension.     Palpations: Abdomen is soft.     Tenderness: There is no abdominal tenderness.  Musculoskeletal:        General: No swelling. Normal range of motion.     Cervical back: Normal range of motion.     Right lower leg: No edema.     Left lower leg: No edema.  Lymphadenopathy:     Cervical: No cervical adenopathy.  Skin:    General: Skin is warm and dry.     Capillary Refill: Capillary refill takes less than 2 seconds.     Coloration: Skin is not jaundiced.     Findings: Lesion (Skin tear on left forearm) present.  Neurological:     General: No focal deficit present.     Mental Status: She is alert and oriented to person, place, and time.  Psychiatric:        Mood and Affect: Mood normal.        Behavior: Behavior normal.    Assessment & Plan:   Problem List Items Addressed This Visit       Iatrogenic hypothyroidism - Primary (Chronic)    She is currently prescribed levothyroxine 75 mcg daily.  Repeat thyroid studies have been ordered today.      Prediabetes    States that she has previously been told that she was borderline diabetic but has never been  told that she was diabetic.  No A1c on file for review.  The previously reported history of diabetes mellitus is unclear.  A1c ordered today.  Records from her previous PCP have been requested.      MDD (major depressive disorder)    Mood stable.  Denies SI/HI.  She is currently prescribed venlafaxine 37.5 mg daily.  No medication changes are indicated today.      Insomnia    She takes at 0.5 mg nightly for insomnia.  PDMP reviewed and is appropriate.  Controlled substance agreement signed today.  UDS pending.      Hyperlipidemia    She is currently prescribed lovastatin 40 mg daily.  Repeat lipid panel ordered today.      Chronic musculoskeletal pain    She endorses chronic pain in her right shoulder, left knee, and lumbar spine.  This is managed with as needed use of hydrocodone-acetaminophen 5-325 mg for severe pain relief.  PDMP reviewed and is appropriate.  This has most recently been managed by Dr. Hilda Lias (orthopedic surgery).  She states that she has only taken 1 pill in the last month.      Intermittent chest pain    She has recently endorsed intermittent left-sided chest pain and requested a referral to cardiology for further evaluation.  She currently has an appointment scheduled for 9/4.      Return in about 6 months (around 08/25/2023).   Billie Lade, MD

## 2023-02-22 NOTE — Assessment & Plan Note (Signed)
She is currently prescribed levothyroxine 75 mcg daily.  Repeat thyroid studies have been ordered today.

## 2023-02-22 NOTE — Assessment & Plan Note (Signed)
She has recently endorsed intermittent left-sided chest pain and requested a referral to cardiology for further evaluation.  She currently has an appointment scheduled for 9/4.

## 2023-02-22 NOTE — Assessment & Plan Note (Signed)
States that she has previously been told that she was borderline diabetic but has never been told that she was diabetic.  No A1c on file for review.  The previously reported history of diabetes mellitus is unclear.  A1c ordered today.  Records from her previous PCP have been requested.

## 2023-02-23 ENCOUNTER — Telehealth: Payer: Self-pay | Admitting: Internal Medicine

## 2023-02-23 LAB — CMP14+EGFR
ALT: 20 IU/L (ref 0–32)
AST: 36 IU/L (ref 0–40)
Albumin: 4.1 g/dL (ref 3.7–4.7)
Alkaline Phosphatase: 60 IU/L (ref 44–121)
BUN/Creatinine Ratio: 29 — ABNORMAL HIGH (ref 12–28)
BUN: 19 mg/dL (ref 8–27)
Bilirubin Total: 0.4 mg/dL (ref 0.0–1.2)
CO2: 24 mmol/L (ref 20–29)
Calcium: 8.9 mg/dL (ref 8.7–10.3)
Chloride: 103 mmol/L (ref 96–106)
Creatinine, Ser: 0.65 mg/dL (ref 0.57–1.00)
Globulin, Total: 2.1 g/dL (ref 1.5–4.5)
Glucose: 107 mg/dL — ABNORMAL HIGH (ref 70–99)
Potassium: 4.1 mmol/L (ref 3.5–5.2)
Sodium: 138 mmol/L (ref 134–144)
Total Protein: 6.2 g/dL (ref 6.0–8.5)
eGFR: 88 mL/min/{1.73_m2} (ref >59)

## 2023-02-23 LAB — CBC WITH DIFFERENTIAL/PLATELET
Basophils Absolute: 0 10*3/uL (ref 0.0–0.2)
Basos: 1 %
EOS (ABSOLUTE): 0.2 10*3/uL (ref 0.0–0.4)
Eos: 4 %
Hematocrit: 40.2 % (ref 34.0–46.6)
Hemoglobin: 13.3 g/dL (ref 11.1–15.9)
Immature Grans (Abs): 0 10*3/uL (ref 0.0–0.1)
Immature Granulocytes: 0 %
Lymphocytes Absolute: 1.4 10*3/uL (ref 0.7–3.1)
Lymphs: 36 %
MCH: 29 pg (ref 26.6–33.0)
MCHC: 33.1 g/dL (ref 31.5–35.7)
MCV: 88 fL (ref 79–97)
Monocytes Absolute: 0.5 10*3/uL (ref 0.1–0.9)
Monocytes: 11 %
Neutrophils Absolute: 2 10*3/uL (ref 1.4–7.0)
Neutrophils: 48 %
Platelets: 212 10*3/uL (ref 150–450)
RBC: 4.59 x10E6/uL (ref 3.77–5.28)
RDW: 13.5 % (ref 11.7–15.4)
WBC: 4.1 10*3/uL (ref 3.4–10.8)

## 2023-02-23 LAB — HEMOGLOBIN A1C
Est. average glucose Bld gHb Est-mCnc: 148 mg/dL
Hgb A1c MFr Bld: 6.8 % — ABNORMAL HIGH (ref 4.8–5.6)

## 2023-02-23 LAB — LIPID PANEL
Chol/HDL Ratio: 3.4 ratio (ref 0.0–4.4)
Cholesterol, Total: 209 mg/dL — ABNORMAL HIGH (ref 100–199)
HDL: 61 mg/dL (ref >39)
LDL Chol Calc (NIH): 134 mg/dL — ABNORMAL HIGH (ref 0–99)
Triglycerides: 80 mg/dL (ref 0–149)
VLDL Cholesterol Cal: 14 mg/dL (ref 5–40)

## 2023-02-23 LAB — B12 AND FOLATE PANEL
Folate: 16.1 ng/mL (ref >3.0)
Vitamin B-12: 459 pg/mL (ref 232–1245)

## 2023-02-23 LAB — VITAMIN D 25 HYDROXY (VIT D DEFICIENCY, FRACTURES): Vit D, 25-Hydroxy: 37.9 ng/mL (ref 30.0–100.0)

## 2023-02-23 LAB — TSH+FREE T4
Free T4: 1.68 ng/dL (ref 0.82–1.77)
TSH: 0.492 u[IU]/mL (ref 0.450–4.500)

## 2023-02-23 NOTE — Telephone Encounter (Signed)
Pt has been informed of labs 

## 2023-02-23 NOTE — Telephone Encounter (Signed)
Patient would like a call back once bloodwork is resulted

## 2023-02-28 LAB — TOXASSURE SELECT 13 (MW), URINE

## 2023-03-01 ENCOUNTER — Telehealth: Payer: Self-pay | Admitting: Internal Medicine

## 2023-03-01 NOTE — Telephone Encounter (Signed)
Return call for labs

## 2023-03-01 NOTE — Telephone Encounter (Signed)
Spoke to patient

## 2023-03-07 ENCOUNTER — Ambulatory Visit: Payer: 59 | Admitting: Orthopaedic Surgery

## 2023-03-07 ENCOUNTER — Encounter: Payer: Self-pay | Admitting: Orthopaedic Surgery

## 2023-03-07 VITALS — BP 127/75 | HR 72 | Ht 63.0 in | Wt 126.0 lb

## 2023-03-07 DIAGNOSIS — G8929 Other chronic pain: Secondary | ICD-10-CM | POA: Diagnosis not present

## 2023-03-07 DIAGNOSIS — M25562 Pain in left knee: Secondary | ICD-10-CM | POA: Diagnosis not present

## 2023-03-07 MED ORDER — METHYLPREDNISOLONE ACETATE 40 MG/ML IJ SUSP
40.0000 mg | Freq: Once | INTRAMUSCULAR | Status: AC
  Administered 2023-03-07: 40 mg via INTRA_ARTICULAR

## 2023-03-07 NOTE — Progress Notes (Signed)
PROCEDURE NOTE:  The patient requests injections of the left knee , verbal consent was obtained.  The left knee was prepped appropriately after time out was performed.   Sterile technique was observed and injection of 1 cc of DepoMedrol 40 mg with several cc's of plain xylocaine. Anesthesia was provided by ethyl chloride and a 20-gauge needle was used to inject the knee area. The injection was tolerated well.  A band aid dressing was applied.  The patient was advised to apply ice later today and tomorrow to the injection sight as needed.  Encounter Diagnosis  Name Primary?   Chronic pain of left knee Yes   I will get MRI of the left knee as she is not improving and has giving way.  Return in three weeks.  Call if any problem.  Precautions discussed.  Electronically Signed Darreld Mclean, MD 8/28/20249:41 AM

## 2023-03-07 NOTE — Patient Instructions (Signed)
Central scheduling [phone] 

## 2023-03-10 ENCOUNTER — Ambulatory Visit (HOSPITAL_COMMUNITY)
Admission: RE | Admit: 2023-03-10 | Discharge: 2023-03-10 | Disposition: A | Discharge status: Home / Self Care | Payer: 59 | Source: Ambulatory Visit | Attending: Orthopaedic Surgery | Admitting: Orthopaedic Surgery

## 2023-03-10 DIAGNOSIS — G8929 Other chronic pain: Secondary | ICD-10-CM | POA: Diagnosis present

## 2023-03-10 DIAGNOSIS — M25562 Pain in left knee: Secondary | ICD-10-CM | POA: Diagnosis present

## 2023-03-14 ENCOUNTER — Ambulatory Visit: Payer: 59 | Attending: Internal Medicine | Admitting: Internal Medicine

## 2023-03-14 ENCOUNTER — Encounter: Payer: Self-pay | Admitting: Internal Medicine

## 2023-03-14 VITALS — BP 138/76 | HR 77 | Ht 63.0 in | Wt 124.0 lb

## 2023-03-14 DIAGNOSIS — Z8249 Family history of ischemic heart disease and other diseases of the circulatory system: Secondary | ICD-10-CM | POA: Diagnosis not present

## 2023-03-14 DIAGNOSIS — Z136 Encounter for screening for cardiovascular disorders: Secondary | ICD-10-CM

## 2023-03-14 DIAGNOSIS — R0789 Other chest pain: Secondary | ICD-10-CM

## 2023-03-14 NOTE — Progress Notes (Signed)
Cardiology Office Note  Date: 03/14/2023   ID: Anne Davenport, DOB 11-Mar-1941, MRN [id number]  PCP:  Anne Lade, MD  Cardiologist:  Marjo Bicker, MD Electrophysiologist:  None   History of Present Illness: Anne Davenport is a 82 y.o. female known to have HLD was referred to cardiology clinic for evaluation of CAD screening.  Patient's daughter passed over the age of 50 years due to CAD.  She was found dead on her couch and prior to this incident, she has been complaining of DOE and back pain.  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  But patient reported that her daughter was a chronic smoker and did not eat right.  Otherwise, patient was having sporadic episodes of chest pressure and chest tightness with no relation to rest or exercise.  Occurs few times in the ER.  No DOE, orthopnea, PND, dizziness, presyncope and syncope.  No leg swelling.  Denies smoking cigarettes.  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   Screening for STD (sexually transmitted disease) 07/23/2015    Past Surgical History:  Procedure Laterality Date   ABDOMINAL HYSTERECTOMY     BLADDER REPAIR     COLONOSCOPY N/A 06/24/2015   Procedure: COLONOSCOPY;  Surgeon: Anne Hippo, MD;  [facility];  Service: Endoscopy;  Laterality: N/A;  730   ESOPHAGOGASTRODUODENOSCOPY N/A 08/14/2014   Procedure: ESOPHAGOGASTRODUODENOSCOPY (EGD);  Surgeon: Anne Hippo, MD;  [facility];  Service: Endoscopy;  Laterality: N/A;  1:10   TONSILLECTOMY     WRIST SURGERY Left     Current Outpatient Medications  Medication Sig Dispense  Refill   acetaminophen (TYLENOL) 500 MG tablet Take 500 mg by mouth every 6 (six) hours as needed for mild pain, moderate pain or headache.     Calcium Carbonate-Vitamin D 600-400 MG-UNIT tablet Take 1 tablet by mouth.      Cholecalciferol (VITAMIN D3) 50 MCG (2000 UT) capsule Take 2,000 Units by mouth daily.     DOTTI 0.1 MG/24HR patch Place 1 patch onto the skin 2 (two) times a week.     fluticasone (FLONASE) 50 MCG/ACT nasal spray Place 2 sprays into the nose daily as needed for rhinitis.      HYDROcodone-acetaminophen (NORCO/VICODIN) 5-325 MG tablet One tablet every four hours as needed for acute pain.  Limit of five days per [place] statue. 30 tablet 0   levothyroxine (SYNTHROID, LEVOTHROID) 75 MCG tablet Take 75 mcg by mouth at bedtime.      LORazepam (ATIVAN) 0.5 MG tablet Take 1 tablet (0.5 mg total) by mouth 2 (two) times daily as needed for anxiety. (Patient taking differently: Take 0.5 mg by mouth at bedtime.) 60 tablet 0   lovastatin (MEVACOR) 40 MG tablet Take 40 mg by mouth daily.     Naproxen Sodium (ALEVE PO) Take 1 tablet by mouth as needed (back pain).      valACYclovir (VALTREX) 1000 MG tablet Take 1 tablet (1,000 mg total) by mouth 2 (two) times daily. 20 tablet 1   venlafaxine XR (EFFEXOR-XR) 37.5 MG 24  hr capsule Take 37.5 mg by mouth daily.     No current facility-administered medications for this visit.   Allergies:  Codeine, Molds & smuts, and Zocor [simvastatin]   Social History: The patient  reports that she quit smoking about 38 years ago. Her smoking use included cigarettes. She started smoking about 48 years ago. She has a 5 pack-year smoking history. She has never used smokeless tobacco. She reports current alcohol use. She reports that she does not use drugs.   Family History: The patient's family history includes Alcohol abuse in her brother; Cancer in her daughter; Diabetes in her maternal grandfather and mother; Heart disease in her daughter and maternal  grandfather; Hodgkin's lymphoma in her daughter; Hypertension in her daughter and mother; Other in her paternal grandfather.   ROS:  Please see the history of present illness. Otherwise, complete review of systems is positive for none.  All other systems are reviewed and negative.   Physical Exam: VS:  BP 138/76   Pulse 77   Ht 5\' 3"  (1.6 m)   Wt 124 lb (56.2 kg)   SpO2 95%   BMI 21.97 kg/m , BMI Body mass index is 21.97 kg/m.  Wt Readings from Last 3 Encounters:  03/14/23 124 lb (56.2 kg)  03/07/23 126 lb (57.2 kg)  02/22/23 127 lb 12.8 oz (58 kg)    General: Patient appears comfortable at rest. HEENT: Conjunctiva and lids normal, oropharynx clear with moist mucosa. Neck: Supple, no elevated JVP or carotid bruits, no thyromegaly. Lungs: Clear to auscultation, nonlabored breathing at rest. Cardiac: Regular rate and rhythm, no S3 or significant systolic murmur, no pericardial rub. Abdomen: Soft, nontender, no hepatomegaly, bowel sounds present, no guarding or rebound. Extremities: No pitting edema, distal pulses 2+. Skin: Warm and dry. Musculoskeletal: No kyphosis. Neuropsychiatric: Alert and oriented x3, affect grossly appropriate.  Recent Labwork: 02/22/2023: ALT 20; AST 36; BUN 19; Creatinine, Ser 0.65; Hemoglobin 13.3; Platelets 212; Potassium 4.1; Sodium 138; TSH 0.492     Component Value Date/Time   CHOL 209 (H) 02/22/2023 1115   TRIG 80 02/22/2023 1115   HDL 61 02/22/2023 1115   CHOLHDL 3.4 02/22/2023 1115   LDLCALC 134 (H) 02/22/2023 1115     Assessment and Plan:  Chest tightness/pressure, sporadic Family history of CAD (daughter passed away at the age of 50 years due to CAD) HLD, not at goal   -Sporadic episodes of chest tightness and chest pressure with no relation to rest or exercise.  Her daughter passed away at the age of 64 years (autopsy revealed three-vessel CAD and enlarged heart) around 2 years ago.  Obtain Lexiscan. No murmur on exam, no need of  echo. -Continue lovastatin 40 mg nightly, goal LDL is 100.  Follow-up with PCP for HLD management.    Medication Adjustments/Labs and Tests Ordered: Current medicines are reviewed at length with the patient today.  Concerns regarding medicines are outlined above.    Disposition:  Follow up pending results  Signed, Nilah Belcourt Verne Spurr, MD, 03/14/2023 4:53 PM    [facility] at [facility] [address], [place], [place] [address]

## 2023-03-14 NOTE — Patient Instructions (Signed)
Medication Instructions:  Your physician recommends that you continue on your current medications as directed. Please refer to the Current Medication list given to you today.  *If you need a refill on your cardiac medications before your next appointment, please call your pharmacy*   Lab Work: None If you have labs (blood work) drawn today and your tests are completely normal, you will receive your results only by: MyChart Message (if you have MyChart) OR A paper copy in the mail If you have any lab test that is abnormal or we need to change your treatment, we will call you to review the results.   Testing/Procedures: Your physician has requested that you have a lexiscan myoview. For further information please visit [email or web address] Please follow instruction sheet, as given.    Follow-Up: At [facility], you and your health needs are our priority.  As part of our continuing mission to provide you with exceptional heart care, we have created designated Provider Care Teams.  These Care Teams include your primary Cardiologist (physician) and Advanced Practice Providers (APPs -  Physician Assistants and Nurse Practitioners) who all work together to provide you with the care you need, when you need it.  We recommend signing up for the patient portal called "MyChart".  Sign up information is provided on this After Visit Summary.  MyChart is used to connect with patients for Virtual Visits (Telemedicine).  Patients are able to view lab/test results, encounter notes, upcoming appointments, etc.  Non-urgent messages can be sent to your provider as well.   To learn more about what you can do with MyChart, go to [email or web address].    Your next appointment:    Pending Testing Results  Provider:    Luane School, MD  Other Instructions

## 2023-03-15 ENCOUNTER — Other Ambulatory Visit (HOSPITAL_COMMUNITY): Payer: Self-pay | Admitting: Internal Medicine

## 2023-03-15 DIAGNOSIS — Z1231 Encounter for screening mammogram for malignant neoplasm of breast: Secondary | ICD-10-CM

## 2023-03-20 ENCOUNTER — Ambulatory Visit: Payer: 59 | Admitting: Orthopaedic Surgery

## 2023-03-21 ENCOUNTER — Ambulatory Visit (INDEPENDENT_AMBULATORY_CARE_PROVIDER_SITE_OTHER): Payer: 59 | Admitting: Orthopaedic Surgery

## 2023-03-21 ENCOUNTER — Encounter: Payer: Self-pay | Admitting: Orthopaedic Surgery

## 2023-03-21 VITALS — BP 127/75 | Ht 63.0 in | Wt 124.0 lb

## 2023-03-21 DIAGNOSIS — M23242 Derangement of anterior horn of lateral meniscus due to old tear or injury, left knee: Secondary | ICD-10-CM | POA: Diagnosis not present

## 2023-03-21 DIAGNOSIS — G8929 Other chronic pain: Secondary | ICD-10-CM

## 2023-03-21 DIAGNOSIS — M794 Hypertrophy of (infrapatellar) fat pad: Secondary | ICD-10-CM | POA: Diagnosis not present

## 2023-03-21 DIAGNOSIS — M23322 Other meniscus derangements, posterior horn of medial meniscus, left knee: Secondary | ICD-10-CM

## 2023-03-21 NOTE — Progress Notes (Signed)
My knee is a little better today.  She had MRI of the left knee showing: IMPRESSION: 1. Progressive degeneration and degenerative tearing of the anterior horn of the lateral meniscus with associated parameniscal cyst formation posteriorly in Hoffa's fat. 2. Mild degenerative undersurface tearing of the posterior horn of the medial meniscus. 3. The cruciate and collateral ligaments are intact. 4. No acute osseous findings. Minimal degenerative chondrosis in the medial and lateral compartments.   I have explained the findings to her.  She would like to see Dr. Romeo Apple to discuss possible arthroscopy of the knee.  I told her it would be up to him.  She could also consider the occasional injections.  I have independently reviewed the MRI.    Left knee has ROM of 0 to 110 today, crepitus, no effusion, positive medial McMurray, good gait, NV intact, no distal edema.  Encounter Diagnosis  Name Primary?   Chronic pain of left knee Yes   To see Dr. Romeo Apple for his evaluation.  Call if any problem.  Precautions discussed.  Electronically Signed Darreld Mclean, MD 9/11/20249:44 AM

## 2023-03-27 ENCOUNTER — Ambulatory Visit (HOSPITAL_COMMUNITY)
Admission: RE | Admit: 2023-03-27 | Discharge: 2023-03-27 | Disposition: A | Discharge status: Home / Self Care | Payer: 59 | Source: Ambulatory Visit | Attending: Internal Medicine | Admitting: Internal Medicine

## 2023-03-27 ENCOUNTER — Encounter (HOSPITAL_BASED_OUTPATIENT_CLINIC_OR_DEPARTMENT_OTHER)
Admission: RE | Admit: 2023-03-27 | Discharge: 2023-03-27 | Disposition: A | Discharge status: Home / Self Care | Payer: 59 | Source: Ambulatory Visit | Attending: Internal Medicine

## 2023-03-27 ENCOUNTER — Encounter (HOSPITAL_COMMUNITY): Payer: Self-pay

## 2023-03-27 DIAGNOSIS — R0789 Other chest pain: Secondary | ICD-10-CM | POA: Diagnosis present

## 2023-03-27 MED ORDER — REGADENOSON 0.4 MG/5ML IV SOLN
INTRAVENOUS | Status: AC
  Administered 2023-03-27: 0.4 mg via INTRAVENOUS
  Filled 2023-03-27: qty 5

## 2023-03-27 MED ORDER — TECHNETIUM TC 99M TETROFOSMIN IV KIT
30.0000 | PACK | Freq: Once | INTRAVENOUS | Status: AC | PRN
  Administered 2023-03-27: 32 via INTRAVENOUS

## 2023-03-27 MED ORDER — TECHNETIUM TC 99M TETROFOSMIN IV KIT
10.0000 | PACK | Freq: Once | INTRAVENOUS | Status: AC | PRN
  Administered 2023-03-27: 10.8 via INTRAVENOUS

## 2023-03-27 MED ORDER — SODIUM CHLORIDE FLUSH 0.9 % IV SOLN
INTRAVENOUS | Status: AC
  Administered 2023-03-27: 10 mL via INTRAVENOUS
  Filled 2023-03-27: qty 10

## 2023-03-28 LAB — NM MYOCAR MULTI W/SPECT W/WALL MOTION / EF
LV dias vol: 47 mL (ref 46–106)
LV sys vol: 14 mL
Nuc Stress EF: 69 %
Peak HR: 93 {beats}/min
RATE: 0.4
Rest HR: 61 {beats}/min
Rest Nuclear Isotope Dose: 10.8 mCi
SDS: 4
SRS: 3
SSS: 7
Stress Nuclear Isotope Dose: 32 mCi
TID: 1.08

## 2023-04-12 ENCOUNTER — Ambulatory Visit: Payer: 59

## 2023-04-18 ENCOUNTER — Ambulatory Visit: Payer: 59 | Admitting: Orthopedic Surgery

## 2023-04-19 ENCOUNTER — Ambulatory Visit: Payer: 59

## 2023-04-19 DIAGNOSIS — Z23 Encounter for immunization: Secondary | ICD-10-CM

## 2023-05-28 ENCOUNTER — Ambulatory Visit: Payer: 59 | Admitting: Adult Health

## 2023-06-18 ENCOUNTER — Encounter (HOSPITAL_COMMUNITY): Payer: Self-pay

## 2023-06-18 ENCOUNTER — Ambulatory Visit (HOSPITAL_COMMUNITY)
Admission: RE | Admit: 2023-06-18 | Discharge: 2023-06-18 | Disposition: A | Discharge status: Home / Self Care | Payer: 59 | Source: Ambulatory Visit | Attending: Internal Medicine | Admitting: Internal Medicine

## 2023-06-18 DIAGNOSIS — Z1231 Encounter for screening mammogram for malignant neoplasm of breast: Secondary | ICD-10-CM | POA: Insufficient documentation

## 2023-07-19 ENCOUNTER — Ambulatory Visit (INDEPENDENT_AMBULATORY_CARE_PROVIDER_SITE_OTHER): Payer: 59 | Admitting: Orthopaedic Surgery

## 2023-07-19 ENCOUNTER — Encounter: Payer: Self-pay | Admitting: Orthopaedic Surgery

## 2023-07-19 VITALS — Ht 63.0 in | Wt 127.0 lb

## 2023-07-19 DIAGNOSIS — M7061 Trochanteric bursitis, right hip: Secondary | ICD-10-CM | POA: Diagnosis not present

## 2023-07-19 MED ORDER — METHYLPREDNISOLONE ACETATE 40 MG/ML IJ SUSP
40.0000 mg | Freq: Once | INTRAMUSCULAR | Status: AC
  Administered 2023-07-19: 40 mg via INTRA_ARTICULAR

## 2023-07-19 NOTE — Progress Notes (Signed)
 PROCEDURE NOTE:  The patient request injection, verbal consent was obtained.  The right trochanteric area of the hip was prepped appropriately after time out was performed.   Sterile technique was observed and injection of 1 cc of DepoMedrol 40 mg with several cc's of plain xylocaine . Anesthesia was provided by ethyl chloride and a 20-gauge needle was used to inject the hip area. The injection was tolerated well.  A band aid dressing was applied.  The patient was advised to apply ice later today and tomorrow to the injection sight as needed.  Encounter Diagnosis  Name Primary?   Trochanteric bursitis, right hip Yes   Return prn.  Call if any problem.  Precautions discussed.  Electronically Signed Lemond Stable, MD 1/9/20259:45 AM

## 2023-07-19 NOTE — Addendum Note (Signed)
 Addended by: Baird Kay on: 07/19/2023 10:25 AM   Modules accepted: Orders

## 2023-07-25 ENCOUNTER — Encounter: Payer: Self-pay | Admitting: Orthopaedic Surgery

## 2023-07-25 ENCOUNTER — Ambulatory Visit: Payer: 59 | Admitting: Orthopaedic Surgery

## 2023-07-25 ENCOUNTER — Ambulatory Visit (INDEPENDENT_AMBULATORY_CARE_PROVIDER_SITE_OTHER): Payer: Self-pay

## 2023-07-25 VITALS — BP 125/89 | HR 80 | Ht 63.0 in | Wt 127.0 lb

## 2023-07-25 DIAGNOSIS — W009XXA Unspecified fall due to ice and snow, initial encounter: Secondary | ICD-10-CM | POA: Diagnosis not present

## 2023-07-25 DIAGNOSIS — M25572 Pain in left ankle and joints of left foot: Secondary | ICD-10-CM

## 2023-07-25 MED ORDER — HYDROCODONE-ACETAMINOPHEN 5-325 MG PO TABS: 1.0000 | ORAL_TABLET | ORAL | 0 refills | Status: AC | PRN

## 2023-07-25 NOTE — Progress Notes (Signed)
 I fell on the ice yesterday.  She fell on the ice yesterday and hurt her left foot, more around the great toe.  She has swelling and pain when walking.  She has no other injury.  The left foot is tender over the great toe.  There is tenderness and swelling but no redness or bruising.  NV intact.  Encounter Diagnosis  Name Primary?   Pain in left ankle and joints of left foot Yes   X-rays were done of the left foot, reported separately.  X-rays negative for fracture.  She declines a CAM walker.  I have reviewed the [place]  Controlled Substance Reporting System web site prior to prescribing narcotic medicine for this patient.  Elevate, ice, rest.  Return in two weeks.  She may call and cancel if doing well.  Call if any problem.  Precautions discussed.  Electronically Signed Pleasant Brilliant, MD 1/15/20259:32 AM

## 2023-08-08 ENCOUNTER — Ambulatory Visit: Payer: 59 | Admitting: Orthopaedic Surgery

## 2023-08-27 ENCOUNTER — Encounter: Payer: Self-pay | Admitting: Internal Medicine

## 2023-08-27 ENCOUNTER — Ambulatory Visit (INDEPENDENT_AMBULATORY_CARE_PROVIDER_SITE_OTHER): Payer: 59 | Admitting: Internal Medicine

## 2023-08-27 ENCOUNTER — Telehealth: Payer: Self-pay | Admitting: Internal Medicine

## 2023-08-27 VITALS — BP 164/75 | HR 71 | Ht 63.0 in | Wt 136.6 lb

## 2023-08-27 DIAGNOSIS — R220 Localized swelling, mass and lump, head: Secondary | ICD-10-CM | POA: Diagnosis not present

## 2023-08-27 DIAGNOSIS — N951 Menopausal and female climacteric states: Secondary | ICD-10-CM

## 2023-08-27 DIAGNOSIS — F325 Major depressive disorder, single episode, in full remission: Secondary | ICD-10-CM

## 2023-08-27 DIAGNOSIS — E1140 Type 2 diabetes mellitus with diabetic neuropathy, unspecified: Secondary | ICD-10-CM | POA: Diagnosis not present

## 2023-08-27 DIAGNOSIS — E032 Hypothyroidism due to medicaments and other exogenous substances: Secondary | ICD-10-CM | POA: Diagnosis not present

## 2023-08-27 DIAGNOSIS — E119 Type 2 diabetes mellitus without complications: Secondary | ICD-10-CM

## 2023-08-27 DIAGNOSIS — E1169 Type 2 diabetes mellitus with other specified complication: Secondary | ICD-10-CM

## 2023-08-27 NOTE — Assessment & Plan Note (Signed)
 There is a mobile, tender mass in the right submandibular region.  Soft tissue ultrasound ordered today for better characterization.

## 2023-08-27 NOTE — Assessment & Plan Note (Signed)
 She remains on twice weekly Dotti patches for symptom relief.

## 2023-08-27 NOTE — Assessment & Plan Note (Signed)
 Mood remains stable with venlafaxine.  No medication changes are indicated today.

## 2023-08-27 NOTE — Progress Notes (Signed)
 Established Patient Office Visit  Subjective   Patient ID: Anne Davenport, female    DOB: [birth year]  Age: 83 y.o. MRN: [id number]  Chief Complaint  Patient presents with   itchy ear    Itchy ears    Hypothyroidism    Six month follow up, patient is concerned for hair loss due to thyroid medication    Anne Davenport returns to care today for routine follow-up.  She was last evaluated by me in August 2024 as a new patient presenting to establish care.  No medication changes were made at that time, basic labs repeated, and 21-month follow-up arranged.  In the interim, she has been evaluated by cardiology and seen by orthopedic surgery in the setting of a left foot injury from a recent fall.  There have otherwise been no acute interval events. Anne Davenport reports feeling fairly well today but has multiple concerns to discuss.  She is concerned about a tender mass in the right submandibular region.  She recently saw her dentist, who did not identify any concerning findings within her oral cavity.  PCP follow-up recommended.  She is additionally interested in reducing her dose of levothyroxine.  Thyroid studies were within normal limits when last updated, but she wonders if her dose is too high because her hair has been falling out.  Anne Davenport has also stopped taking lorazepam since her last appointment and feels well off medication.  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   Screening for STD (sexually transmitted disease) 07/23/2015   Past Surgical History:  Procedure Laterality Date   ABDOMINAL HYSTERECTOMY     BLADDER REPAIR     COLONOSCOPY N/A 06/24/2015    Procedure: COLONOSCOPY;  Surgeon: Malissa Hippo, MD;  [facility];  Service: Endoscopy;  Laterality: N/A;  730   ESOPHAGOGASTRODUODENOSCOPY N/A 08/14/2014   Procedure: ESOPHAGOGASTRODUODENOSCOPY (EGD);  Surgeon: Malissa Hippo, MD;  [facility]: [facility];  Service: Endoscopy;  Laterality: N/A;  1:10   TONSILLECTOMY     WRIST SURGERY Left    Social History   Tobacco Use   Smoking status: Former    Current packs/day: 0.00    Average packs/day: 0.5 packs/day for 10.0 years (5.0 ttl pk-yrs)    Types: Cigarettes    Start date: 07/09/1974    Quit date: 07/09/1984    Years since quitting: 39.1   Smokeless tobacco: Never  Vaping Use   Vaping status: Never Used  Substance Use Topics   Alcohol use: Yes    Alcohol/week: 0.0 standard drinks of alcohol    Comment: "once in a while"   Drug use: No   Family History  Problem Relation Age of Onset   Diabetes Mother    Hypertension Mother    Alcohol abuse Brother    Cancer Daughter        breast   Hodgkin's lymphoma Daughter    Hypertension Daughter    Heart disease Daughter    Diabetes Maternal Grandfather    Heart disease Maternal Grandfather    Other Paternal Grandfather  poor circulation   Allergies  Allergen Reactions   Codeine Itching and Nausea And Vomiting   Molds & Smuts Other (See Comments)    Reaction:  Itchy, watery eyes    Zocor [Simvastatin] Nausea And Vomiting and Rash   Review of Systems  Constitutional:  Negative for chills and fever.  HENT:  Negative for sore throat.        Tender mass, right submandibular region  Respiratory:  Negative for cough and shortness of breath.   Cardiovascular:  Negative for chest pain, palpitations and leg swelling.  Gastrointestinal:  Negative for abdominal pain, blood in stool, constipation, diarrhea, nausea and vomiting.  Genitourinary:  Negative for dysuria and hematuria.  Musculoskeletal:  Negative for myalgias.  Skin:  Negative for itching and rash.   Neurological:  Negative for dizziness and headaches.  Psychiatric/Behavioral:  Negative for depression and suicidal ideas.      Objective:     BP (!) 164/75 (BP Location: Left Arm, Patient Position: Sitting, Cuff Size: Normal)   Pulse 71   Ht 5\' 3"  (1.6 m)   Wt 136 lb 9.6 oz (62 kg)   SpO2 96%   BMI 24.20 kg/m  BP Readings from Last 3 Encounters:  08/27/23 (!) 164/75  07/25/23 125/89  03/21/23 127/75   Physical Exam Vitals reviewed.  Constitutional:      General: She is not in acute distress.    Appearance: Normal appearance. She is not toxic-appearing.  HENT:     Head: Normocephalic and atraumatic.     Right Ear: External ear normal.     Left Ear: External ear normal.     Nose: Nose normal. No congestion or rhinorrhea.     Mouth/Throat:     Mouth: Mucous membranes are moist.     Pharynx: Oropharynx is clear. No oropharyngeal exudate or posterior oropharyngeal erythema.  Eyes:     General: No scleral icterus.    Extraocular Movements: Extraocular movements intact.     Conjunctiva/sclera: Conjunctivae normal.     Pupils: Pupils are equal, round, and reactive to light.  Neck:     Comments: There is a mobile, tender mass in the right submandibular region Cardiovascular:     Rate and Rhythm: Normal rate and regular rhythm.     Pulses: Normal pulses.     Heart sounds: Normal heart sounds. No murmur heard.    No friction rub. No gallop.  Pulmonary:     Effort: Pulmonary effort is normal.     Breath sounds: Normal breath sounds. No wheezing, rhonchi or rales.  Abdominal:     General: Abdomen is flat. Bowel sounds are normal. There is no distension.     Palpations: Abdomen is soft.     Tenderness: There is no abdominal tenderness.  Musculoskeletal:        General: No swelling. Normal range of motion.     Cervical back: Normal range of motion.     Right lower leg: No edema.     Left lower leg: No edema.  Lymphadenopathy:     Cervical: No cervical adenopathy.   Skin:    General: Skin is warm and dry.     Capillary Refill: Capillary refill takes less than 2 seconds.     Coloration: Skin is not jaundiced.  Neurological:     General: No focal deficit present.     Mental Status: She is alert and oriented to person, place, and time.  Psychiatric:        Mood  and Affect: Mood normal.        Behavior: Behavior normal.   Last CBC Lab Results  Component Value Date   WBC 4.1 02/22/2023   HGB 13.3 02/22/2023   HCT 40.2 02/22/2023   MCV 88 02/22/2023   MCH 29.0 02/22/2023   RDW 13.5 02/22/2023   PLT 212 02/22/2023   Last metabolic panel Lab Results  Component Value Date   GLUCOSE 107 (H) 02/22/2023   NA 138 02/22/2023   K 4.1 02/22/2023   CL 103 02/22/2023   CO2 24 02/22/2023   BUN 19 02/22/2023   CREATININE 0.65 02/22/2023   EGFR 88 02/22/2023   CALCIUM 8.9 02/22/2023   PROT 6.2 02/22/2023   ALBUMIN 4.1 02/22/2023   LABGLOB 2.1 02/22/2023   BILITOT 0.4 02/22/2023   ALKPHOS 60 02/22/2023   AST 36 02/22/2023   ALT 20 02/22/2023   ANIONGAP 8 05/20/2019   Last lipids Lab Results  Component Value Date   CHOL 209 (H) 02/22/2023   HDL 61 02/22/2023   LDLCALC 134 (H) 02/22/2023   TRIG 80 02/22/2023   CHOLHDL 3.4 02/22/2023   Last hemoglobin A1c Lab Results  Component Value Date   HGBA1C 6.8 (H) 02/22/2023   Last thyroid functions Lab Results  Component Value Date   TSH 0.492 02/22/2023   Last vitamin D Lab Results  Component Value Date   VD25OH 37.9 02/22/2023   Last vitamin B12 and Folate Lab Results  Component Value Date   VITAMINB12 459 02/22/2023   FOLATE 16.1 02/22/2023     Assessment & Plan:   Problem List Items Addressed This Visit       Iatrogenic hypothyroidism   Currently prescribed levothyroxine 75 mcg daily.  Thyroid studies updated in August 2024 were within normal limits.  Her acute concern today is requesting to reduce her dose of levothyroxine if thyroid studies remain within normal limits.  She  is concerned about alopecia and believes that levothyroxine may be contributing to her symptoms. -Repeat thyroid studies ordered today.  Consider dose adjustments pending results.      Type 2 diabetes mellitus with diabetic neuropathy (HCC)   A1c 6.8 on labs from August 2024.  She is not currently on any medication for treatment of diabetes mellitus.  Repeat A1c and urine microalbumin/creatinine ratio ordered today.      MDD (major depressive disorder)   Mood remains stable with venlafaxine.  No medication changes are indicated today.      Vasomotor symptoms due to menopause   She remains on twice weekly Dotti patches for symptom relief.      Mass of right submandibular region - Primary   There is a mobile, tender mass in the right submandibular region.  Soft tissue ultrasound ordered today for better characterization.      Return in about 6 months (around 02/24/2024).   Billie Lade, MD

## 2023-08-27 NOTE — Patient Instructions (Signed)
 It was a pleasure to see you today.  Thank you for giving [place] the opportunity to be involved in your care.  Below is a brief recap of your visit and next steps.  We will plan to see you again in 6 months.  Summary No medication changes today Repeat labs ordered Will check ultrasound of your neck Follow up in 6 months

## 2023-08-27 NOTE — Assessment & Plan Note (Signed)
 Currently prescribed levothyroxine 75 mcg daily.  Thyroid studies updated in August 2024 were within normal limits.  Her acute concern today is requesting to reduce her dose of levothyroxine if thyroid studies remain within normal limits.  She is concerned about alopecia and believes that levothyroxine may be contributing to her symptoms. -Repeat thyroid studies ordered today.  Consider dose adjustments pending results.

## 2023-08-27 NOTE — Telephone Encounter (Signed)
 Copied from CRM [id number]. Topic: Clinical - Medication Refill >> Aug 27, 2023  2:58 PM Hector Shade B wrote: Most Recent Primary Care Visit:  Provider: Christel Mormon E  Department: [facility]  Visit Type: OFFICE VISIT  Date: 08/27/2023  Medication: Mometasone 0.1%   Has the patient contacted their pharmacy? Yes (Agent: If no, request that the patient contact the pharmacy for the refill. If patient does not wish to contact the pharmacy document the reason why and proceed with request.) (Agent: If yes, when and what did the pharmacy advise?) patient stated that no pharmacy had it, [facility] stated if the provided with a prescription she can get it for the patient.   Is this the correct pharmacy for this prescription? Yes If no, delete pharmacy and type the correct one.  This is the patient's preferred pharmacy:  [facility] - [place], [place] - [address] [place] [address] Phone: [phone] Fax: [phone]  [facility] [address] [place] - [address] [place] [address] [place] [address] [place] [address] Phone: [phone] Fax: [phone]   Has the prescription been filled recently? No  Is the patient out of the medication? No  Has the patient been seen for an appointment in the last year OR does the patient have an upcoming appointment? Yes  Can we respond through MyChart? Yes  Agent: Please be advised that Rx refills may take up to 3 business days. We ask that you follow-up with your pharmacy.

## 2023-08-27 NOTE — Assessment & Plan Note (Addendum)
 A1c 6.8 on labs from August 2024.  She is not currently on any medication for treatment of diabetes mellitus.  Repeat A1c and urine microalbumin/creatinine ratio ordered today.

## 2023-08-28 ENCOUNTER — Encounter: Payer: Self-pay | Admitting: Internal Medicine

## 2023-08-28 NOTE — Telephone Encounter (Signed)
 Spoke to patient she is not needing this medication

## 2023-08-30 LAB — TSH+FREE T4
Free T4: 1.38 ng/dL (ref 0.82–1.77)
TSH: 1.9 u[IU]/mL (ref 0.450–4.500)

## 2023-08-30 LAB — MICROALBUMIN / CREATININE URINE RATIO: Creatinine, Urine: 38.7 mg/dL

## 2023-08-30 LAB — HEMOGLOBIN A1C
Est. average glucose Bld gHb Est-mCnc: 160 mg/dL
Hgb A1c MFr Bld: 7.2 % — ABNORMAL HIGH (ref 4.8–5.6)

## 2023-09-03 ENCOUNTER — Ambulatory Visit (HOSPITAL_COMMUNITY)
Admission: RE | Admit: 2023-09-03 | Discharge: 2023-09-03 | Disposition: A | Discharge status: Home / Self Care | Payer: 59 | Source: Ambulatory Visit | Attending: Internal Medicine | Admitting: Internal Medicine

## 2023-09-03 DIAGNOSIS — R220 Localized swelling, mass and lump, head: Secondary | ICD-10-CM | POA: Diagnosis not present

## 2023-09-03 DIAGNOSIS — Z0389 Encounter for observation for other suspected diseases and conditions ruled out: Secondary | ICD-10-CM | POA: Diagnosis not present

## 2023-09-05 DIAGNOSIS — H04123 Dry eye syndrome of bilateral lacrimal glands: Secondary | ICD-10-CM | POA: Diagnosis not present

## 2023-09-10 ENCOUNTER — Encounter: Payer: Self-pay | Admitting: Internal Medicine

## 2023-10-03 LAB — LAB REPORT - SCANNED: Microalb Creat Ratio: 29.999

## 2023-10-11 ENCOUNTER — Other Ambulatory Visit: Payer: Self-pay | Admitting: Internal Medicine

## 2023-10-17 ENCOUNTER — Encounter: Payer: Self-pay | Admitting: Orthopaedic Surgery

## 2023-10-17 ENCOUNTER — Ambulatory Visit (INDEPENDENT_AMBULATORY_CARE_PROVIDER_SITE_OTHER): Admitting: Orthopaedic Surgery

## 2023-10-17 DIAGNOSIS — M7061 Trochanteric bursitis, right hip: Secondary | ICD-10-CM | POA: Diagnosis not present

## 2023-10-17 MED ORDER — METHYLPREDNISOLONE ACETATE 40 MG/ML IJ SUSP
40.0000 mg | Freq: Once | INTRAMUSCULAR | Status: AC
  Administered 2023-10-17: 40 mg via INTRA_ARTICULAR

## 2023-10-17 NOTE — Progress Notes (Signed)
 PROCEDURE NOTE:  The patient request injection, verbal consent was obtained.  The right trochanteric area of the hip was prepped appropriately after time out was performed.   Sterile technique was observed and injection of 1 cc of DepoMedrol 40 mg with several cc's of plain xylocaine. Anesthesia was provided by ethyl chloride and a 20-gauge needle was used to inject the hip area. The injection was tolerated well.  A band aid dressing was applied.  The patient was advised to apply ice later today and tomorrow to the injection sight as needed.  Encounter Diagnosis  Name Primary?   Trochanteric bursitis, right hip Yes   I will see her in two weeks.  I am also concerned about right sciatica.  Call if any problem.  Precautions discussed.  Electronically Signed Darreld Mclean, MD 4/9/202510:13 AM

## 2023-10-17 NOTE — Addendum Note (Signed)
 Addended by: Elvina Mattes T on: 10/17/2023 11:39 AM   Modules accepted: Orders

## 2023-10-22 ENCOUNTER — Other Ambulatory Visit: Payer: Self-pay | Admitting: Internal Medicine

## 2023-10-22 NOTE — Telephone Encounter (Signed)
 Pt called back to report that she needs [facility] to [facility], please advise. Pt is out of town and left her supply back home in [place].   [id number] [place] - [address] AT [facility] [address] [place]  [address] [place] [address]  Phone: [phone] Fax: [phone]

## 2023-11-06 ENCOUNTER — Other Ambulatory Visit: Payer: Self-pay | Admitting: Internal Medicine

## 2023-11-07 ENCOUNTER — Ambulatory Visit: Admitting: Orthopaedic Surgery

## 2023-11-14 ENCOUNTER — Encounter: Payer: Self-pay | Admitting: Orthopaedic Surgery

## 2023-11-14 ENCOUNTER — Other Ambulatory Visit (INDEPENDENT_AMBULATORY_CARE_PROVIDER_SITE_OTHER): Payer: Self-pay

## 2023-11-14 ENCOUNTER — Ambulatory Visit: Admitting: Orthopaedic Surgery

## 2023-11-14 VITALS — BP 142/75 | HR 74 | Ht 63.0 in | Wt 136.0 lb

## 2023-11-14 DIAGNOSIS — M545 Low back pain, unspecified: Secondary | ICD-10-CM | POA: Diagnosis not present

## 2023-11-14 MED ORDER — LIDOCAINE 5 % EX PTCH: 1.0000 | MEDICATED_PATCH | CUTANEOUS | 3 refills | Status: AC

## 2023-11-14 NOTE — Progress Notes (Signed)
 My back is hurting now  She has right lower back pain.  The trochanteric injection last time helped but her pain is now in the lower back going to the right lower leg.  She has no weakness, no new trauma.  Right lower back is tender, no spasm, ROM is good but tender, NV intact, SLR negative, muscle tone and strength normal.  X-rays were done of the lumbar spine, reported separately.  Encounter Diagnosis  Name Primary?   Lumbar pain Yes   I will schedule MRI of the lumbar spine.  Return in two weeks.  Call if any problem.  Precautions discussed.  I will Rx Lidocaine  5% patches.  Call if any problem.  Precautions discussed.  Electronically Signed Pleasant Brilliant, MD 5/7/20252:23 PM

## 2023-11-14 NOTE — Patient Instructions (Signed)
 MRI will be scheduled at [facility], they will call you, if you have not heard from them in a week call them to schedule [phone].

## 2023-11-21 ENCOUNTER — Ambulatory Visit (HOSPITAL_COMMUNITY)
Admission: RE | Admit: 2023-11-21 | Discharge: 2023-11-21 | Disposition: A | Discharge status: Home / Self Care | Source: Ambulatory Visit | Attending: Orthopaedic Surgery | Admitting: Orthopaedic Surgery

## 2023-11-21 DIAGNOSIS — M4807 Spinal stenosis, lumbosacral region: Secondary | ICD-10-CM | POA: Diagnosis not present

## 2023-11-21 DIAGNOSIS — M48061 Spinal stenosis, lumbar region without neurogenic claudication: Secondary | ICD-10-CM | POA: Diagnosis not present

## 2023-11-21 DIAGNOSIS — M545 Low back pain, unspecified: Secondary | ICD-10-CM | POA: Diagnosis not present

## 2023-11-21 DIAGNOSIS — M5136 Other intervertebral disc degeneration, lumbar region with discogenic back pain only: Secondary | ICD-10-CM | POA: Diagnosis not present

## 2023-11-21 DIAGNOSIS — M4805 Spinal stenosis, thoracolumbar region: Secondary | ICD-10-CM | POA: Diagnosis not present

## 2023-11-28 ENCOUNTER — Telehealth: Payer: Self-pay | Admitting: Orthopaedic Surgery

## 2023-11-28 ENCOUNTER — Ambulatory Visit: Admitting: Orthopaedic Surgery

## 2023-11-30 ENCOUNTER — Encounter: Payer: Self-pay | Admitting: Internal Medicine

## 2023-11-30 ENCOUNTER — Ambulatory Visit (INDEPENDENT_AMBULATORY_CARE_PROVIDER_SITE_OTHER): Payer: Self-pay | Admitting: Internal Medicine

## 2023-11-30 VITALS — BP 136/76 | HR 77 | Ht 63.0 in | Wt 136.2 lb

## 2023-11-30 DIAGNOSIS — E782 Mixed hyperlipidemia: Secondary | ICD-10-CM | POA: Diagnosis not present

## 2023-11-30 DIAGNOSIS — E1140 Type 2 diabetes mellitus with diabetic neuropathy, unspecified: Secondary | ICD-10-CM | POA: Diagnosis not present

## 2023-11-30 DIAGNOSIS — E032 Hypothyroidism due to medicaments and other exogenous substances: Secondary | ICD-10-CM

## 2023-11-30 NOTE — Patient Instructions (Signed)
 It was a pleasure to see you today.  Thank you for giving us  the opportunity to be involved in your care.  Below is a brief recap of your visit and next steps.  We will plan to see you again in 6 months.  Summary No medications today Follow up in 6 months

## 2023-11-30 NOTE — Assessment & Plan Note (Signed)
 A1c increased to 7.2 from 6.8 on labs from February.  She would like to continue a dietary approach to diabetes control.  No medication changes were made today.

## 2023-11-30 NOTE — Assessment & Plan Note (Signed)
 Lipid panel updated in August 2024.  Total cholesterol 209 and LDL 134.  She has improved the consistency with which she takes lovastatin.  Repeat lipid panel at next follow-up appointment.

## 2023-11-30 NOTE — Progress Notes (Signed)
 Established Patient Office Visit  Subjective   Patient ID: Anne Davenport, female    DOB: Nov 01, 1940  Age: 83 y.o. MRN: [id number]  Chief Complaint  Patient presents with   Follow-up   Anne Davenport returns to care today for routine follow-up.  She was last evaluated by me on 2/17.  No medication changes were made at that time, repeat labs ordered, and a soft tissue ultrasound ordered for better characterization of a right submandibular mass.  She has been seen by orthopedic surgery in the interim.  There have otherwise been no acute interval events.  Today she reports feeling well and has no acute concerns to discuss.  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   Screening for STD (sexually transmitted disease) 07/23/2015   Past Surgical History:  Procedure Laterality Date   ABDOMINAL HYSTERECTOMY     BLADDER REPAIR     COLONOSCOPY N/A 06/24/2015   Procedure: COLONOSCOPY;  Surgeon: Ruby Corporal, MD;  [facility];  Service: Endoscopy;  Laterality: N/A;  730   ESOPHAGOGASTRODUODENOSCOPY N/A 08/14/2014   Procedure: ESOPHAGOGASTRODUODENOSCOPY (EGD);  Surgeon: Ruby Corporal, MD;  [facility];  Service: Endoscopy;  Laterality: N/A;  1:10   TONSILLECTOMY     WRIST SURGERY Left    Social History   Tobacco Use   Smoking status: Former    Current packs/day: 0.00    Average packs/day: 0.5 packs/day for 10.0 years (5.0 ttl pk-yrs)    Types: Cigarettes    Start date: 07/09/1974    Quit date: 07/09/1984    Years since quitting: 39.4   Smokeless tobacco: Never  Vaping Use   Vaping status: Never Used  Substance Use Topics    Alcohol use: Yes    Alcohol/week: 0.0 standard drinks of alcohol    Comment: "once in a while"   Drug use: No   Family History  Problem Relation Age of Onset   Diabetes Mother    Hypertension Mother    Alcohol abuse Brother    Cancer Daughter        breast   Hodgkin's lymphoma Daughter    Hypertension Daughter    Heart disease Daughter    Diabetes Maternal Grandfather    Heart disease Maternal Grandfather    Other Paternal Grandfather        poor circulation   Allergies  Allergen Reactions   Codeine Itching and Nausea And Vomiting   Molds & Smuts Other (See Comments)    Reaction:  Itchy, watery eyes    Zocor [Simvastatin] Nausea And Vomiting and Rash   Review of Systems  Constitutional:  Negative for chills and fever.  HENT:  Negative for sore throat.   Respiratory:  Negative for cough and shortness of breath.   Cardiovascular:  Negative for chest pain, palpitations and leg swelling.  Gastrointestinal:  Negative for abdominal pain, blood in stool, constipation, diarrhea, nausea and vomiting.  Genitourinary:  Negative for dysuria and hematuria.  Musculoskeletal:  Negative for myalgias.  Skin:  Negative for itching and rash.  Neurological:  Negative for dizziness and headaches.  Psychiatric/Behavioral:  Negative for depression and suicidal ideas.      Objective:     BP 136/76   Pulse 77   Ht 5\' 3"  (1.6 m)   Wt 136 lb 4 oz (61.8 kg)   SpO2 95%   BMI 24.14 kg/m  BP Readings from Last 3 Encounters:  11/30/23 136/76  11/14/23 (!) 142/75  08/27/23 (!) 164/75   Physical Exam Vitals reviewed.  Constitutional:      General: She is not in acute distress.    Appearance: Normal appearance. She is not toxic-appearing.  HENT:     Head: Normocephalic and atraumatic.     Right Ear: External ear normal.     Left Ear: External ear normal.     Nose: Nose normal. No congestion or rhinorrhea.     Mouth/Throat:     Mouth: Mucous membranes are moist.     Pharynx: Oropharynx  is clear. No oropharyngeal exudate or posterior oropharyngeal erythema.  Eyes:     General: No scleral icterus.    Extraocular Movements: Extraocular movements intact.     Conjunctiva/sclera: Conjunctivae normal.     Pupils: Pupils are equal, round, and reactive to light.  Cardiovascular:     Rate and Rhythm: Normal rate and regular rhythm.     Pulses: Normal pulses.     Heart sounds: Normal heart sounds. No murmur heard.    No friction rub. No gallop.  Pulmonary:     Effort: Pulmonary effort is normal.     Breath sounds: Normal breath sounds. No wheezing, rhonchi or rales.  Abdominal:     General: Abdomen is flat. Bowel sounds are normal. There is no distension.     Palpations: Abdomen is soft.     Tenderness: There is no abdominal tenderness.  Musculoskeletal:        General: No swelling. Normal range of motion.     Cervical back: Normal range of motion.     Right lower leg: No edema.     Left lower leg: No edema.  Lymphadenopathy:     Cervical: No cervical adenopathy.  Skin:    General: Skin is warm and dry.     Capillary Refill: Capillary refill takes less than 2 seconds.     Coloration: Skin is not jaundiced.  Neurological:     General: No focal deficit present.     Mental Status: She is alert and oriented to person, place, and time.  Psychiatric:        Mood and Affect: Mood normal.        Behavior: Behavior normal.   Last CBC Lab Results  Component Value Date   WBC 4.1 02/22/2023   HGB 13.3 02/22/2023   HCT 40.2 02/22/2023   MCV 88 02/22/2023   MCH 29.0 02/22/2023   RDW 13.5 02/22/2023   PLT 212 02/22/2023   Last metabolic panel Lab Results  Component Value Date   GLUCOSE 107 (H) 02/22/2023   NA 138 02/22/2023   K 4.1 02/22/2023   CL 103 02/22/2023   CO2 24 02/22/2023   BUN 19 02/22/2023   CREATININE 0.65 02/22/2023   EGFR 88 02/22/2023   CALCIUM 8.9 02/22/2023   PROT 6.2 02/22/2023   ALBUMIN 4.1 02/22/2023   LABGLOB 2.1 02/22/2023   BILITOT 0.4  02/22/2023   ALKPHOS 60 02/22/2023   AST 36 02/22/2023   ALT 20 02/22/2023   ANIONGAP 8 05/20/2019   Last lipids Lab Results  Component Value Date   CHOL  209 (H) 02/22/2023   HDL 61 02/22/2023   LDLCALC 134 (H) 02/22/2023   TRIG 80 02/22/2023   CHOLHDL 3.4 02/22/2023   Last hemoglobin A1c Lab Results  Component Value Date   HGBA1C 7.2 (H) 08/27/2023   Last thyroid  functions Lab Results  Component Value Date   TSH 1.900 08/27/2023   Last vitamin D  Lab Results  Component Value Date   VD25OH 37.9 02/22/2023   Last vitamin B12 and Folate Lab Results  Component Value Date   VITAMINB12 459 02/22/2023   FOLATE 16.1 02/22/2023     Assessment & Plan:   Problem List Items Addressed This Visit       Iatrogenic hypothyroidism - Primary   She remains on levothyroxine  75 mcg daily.  Asymptomatic currently.  TSH WNL when updated in February.  No changes are indicated today.      Type 2 diabetes mellitus with diabetic neuropathy (HCC)   A1c increased to 7.2 from 6.8 on labs from February.  She would like to continue a dietary approach to diabetes control.  No medication changes were made today.      Hyperlipidemia (Chronic)   Lipid panel updated in August 2024.  Total cholesterol 209 and LDL 134.  She has improved the consistency with which she takes lovastatin.  Repeat lipid panel at next follow-up appointment.      Return in about 6 months (around 06/01/2024).   Tobi Fortes, MD

## 2023-11-30 NOTE — Assessment & Plan Note (Signed)
 She remains on levothyroxine  75 mcg daily.  Asymptomatic currently.  TSH WNL when updated in February.  No changes are indicated today.

## 2023-12-01 ENCOUNTER — Emergency Department (HOSPITAL_COMMUNITY)
Admission: EM | Admit: 2023-12-01 | Discharge: 2023-12-01 | Disposition: A | Discharge status: Home / Self Care | Attending: Emergency Medicine | Admitting: Emergency Medicine

## 2023-12-01 ENCOUNTER — Emergency Department (HOSPITAL_COMMUNITY)

## 2023-12-01 ENCOUNTER — Encounter (HOSPITAL_COMMUNITY): Payer: Self-pay | Admitting: Emergency Medicine

## 2023-12-01 ENCOUNTER — Other Ambulatory Visit: Payer: Self-pay

## 2023-12-01 DIAGNOSIS — M25561 Pain in right knee: Secondary | ICD-10-CM | POA: Insufficient documentation

## 2023-12-01 DIAGNOSIS — S83206A Unspecified tear of unspecified meniscus, current injury, right knee, initial encounter: Secondary | ICD-10-CM | POA: Diagnosis not present

## 2023-12-01 DIAGNOSIS — M1711 Unilateral primary osteoarthritis, right knee: Secondary | ICD-10-CM | POA: Diagnosis not present

## 2023-12-01 MED ORDER — OXYCODONE-ACETAMINOPHEN 5-325 MG PO TABS
2.0000 | ORAL_TABLET | Freq: Once | ORAL | Status: AC
  Administered 2023-12-01: 2 via ORAL
  Filled 2023-12-01: qty 2

## 2023-12-01 MED ORDER — NAPROXEN 500 MG PO TABS: 500.0000 mg | ORAL_TABLET | Freq: Two times a day (BID) | ORAL | 0 refills | Status: DC

## 2023-12-01 NOTE — Discharge Instructions (Signed)
 Xray is normal = no broken bones See Dr. Phyllis Breeze or the orthopedist of your choice in 1 week  Call Monday for appointment.  Please take Naprosyn, 500mg  by mouth twice daily as needed for pain - this in an antiinflammatory medicine (NSAID) and is similar to ibuprofen - many people feel that it is stronger than ibuprofen and it is easier to take since it is a smaller pill.  Please use this only for 1 week - if your pain persists, you will need to follow up with your doctor in the office for ongoing guidance and pain control.  Use the crutches and brace to help walk Ice and elevate

## 2023-12-01 NOTE — ED Triage Notes (Signed)
 Pt reports with right knee pain, hx of torn meniscus (last year), reports pain when getting out of bed this am and unable to bear weight on it

## 2023-12-01 NOTE — ED Provider Notes (Signed)
 [place] EMERGENCY DEPARTMENT AT [facility] Provider Note   CSN: [id number] Arrival date & time: 12/01/23  4098     History  Chief Complaint  Patient presents with   Knee Pain    Anne Davenport is a 83 y.o. female.   Knee Pain  This patient has a history of torn meniscus on the right side which was previously diagnosed by her orthopedist Dr. Iline Mallory, she states that when she tried to get out of bed this morning she felt an acute onset of a sharp pain in her knee with a feeling of her leg giving out.  She has had ongoing pain and mild swelling since that time.  No fevers or chills no numbness or weakness, she has a follow-up with orthopedics on Wednesday of this coming week    Home Medications Prior to Admission medications   Medication Sig Start Date End Date Taking? Authorizing Provider  naproxen (NAPROSYN) 500 MG tablet Take 1 tablet (500 mg total) by mouth 2 (two) times daily with a meal. 12/01/23  Yes Early Glisson, MD  acetaminophen  (TYLENOL ) 500 MG tablet Take 500 mg by mouth every 6 (six) hours as needed for mild pain, moderate pain or headache.    [provider]  Calcium Carbonate-Vitamin D  600-400 MG-UNIT tablet Take 1 tablet by mouth.    [provider]  Cholecalciferol (VITAMIN D3) 50 MCG (2000 UT) capsule Take 2,000 Units by mouth daily.    [provider]  DOTTI 0.1 MG/24HR patch Place 1 patch onto the skin 2 (two) times a week. 09/20/22   [provider]  fluticasone (FLONASE) 50 MCG/ACT nasal spray Place 2 sprays into the nose daily as needed for rhinitis.     [provider]  HYDROcodone -acetaminophen  (NORCO/VICODIN) 5-325 MG tablet One tablet every four hours as needed for acute pain.  Limit of five days per [place]. 11/14/22   Pleasant Brilliant, MD  levothyroxine  (SYNTHROID ) 75 MCG tablet TAKE ONE (1) TABLET BY MOUTH EVERY DAY IN THE MORNING ON AN EMPTY STOMACH 10/22/23   Tobi Fortes, MD  lidocaine   (LIDODERM ) 5 % Place 1 patch onto the skin daily. Remove & Discard patch within 12 hours or as directed by MD 11/14/23   Pleasant Brilliant, MD  LORazepam  (ATIVAN ) 0.5 MG tablet TAKE ONE-HALF TO ONE TABLET BY MOUTH AT BEDTIME 10/11/23   Dixon, Phillip E, MD  lovastatin (MEVACOR) 40 MG tablet TAKE ONE (1) TABLET BY MOUTH EVERY DAY 11/06/23   Dixon, Phillip E, MD  Naproxen Sodium (ALEVE PO) Take 1 tablet by mouth as needed (back pain).     [provider]  tiZANidine  (ZANAFLEX ) 4 MG tablet TAKE ONE TABLET BY MOUTH EVERY NIGHT BEFORE BED AS NEEDED FOR SPASMS 11/28/23   Pleasant Brilliant, MD  valACYclovir  (VALTREX ) 1000 MG tablet Take 1 tablet (1,000 mg total) by mouth 2 (two) times daily. Patient not taking: Reported on 11/30/2023 02/01/23   Lendia Quay A, NP  venlafaxine XR (EFFEXOR-XR) 37.5 MG 24 hr capsule Take 37.5 mg by mouth daily. 11/07/21   [provider]      Allergies    Codeine, Molds & smuts, and Zocor [simvastatin]    Review of Systems   Review of Systems  All other systems reviewed and are negative.   Physical Exam Updated Vital Signs BP (!) 164/91 (BP Location: Left Arm)   Pulse 74   Temp 97.8 F (36.6 C) (Oral)   Resp 17  Ht 1.6 m (5\' 3" )   Wt 61.7 kg   SpO2 97%   BMI 24.09 kg/m  Physical Exam Vitals and nursing note reviewed.  Constitutional:      Appearance: She is well-developed. She is not diaphoretic.  HENT:     Head: Normocephalic and atraumatic.  Eyes:     General:        Right eye: No discharge.        Left eye: No discharge.     Conjunctiva/sclera: Conjunctivae normal.  Pulmonary:     Effort: Pulmonary effort is normal. No respiratory distress.  Musculoskeletal:        General: Swelling and tenderness present. No signs of injury.     Right lower leg: No edema.     Left lower leg: No edema.     Comments: Very mild asymmetry of the knees with the right knee feeling a bit more full but no obvious clinical effusion.  Decreased range of motion  of the right knee secondary to pain, she can straight leg raise without difficulty.  No tenderness over the bony structures of the knee, appears to have stability by stressing both anterior posterior medial and lateral.  Skin:    General: Skin is warm and dry.     Findings: No erythema or rash.  Neurological:     Mental Status: She is alert.     Coordination: Coordination normal.     ED Results / Procedures / Treatments   Labs (all labs ordered are listed, but only abnormal results are displayed) Labs Reviewed - No data to display  EKG None  Radiology DG Knee Complete 4 Views Right Result Date: 12/01/2023 CLINICAL DATA:  Right knee pain, history of torn meniscus EXAM: RIGHT KNEE - COMPLETE 4+ VIEW COMPARISON:  01/31/2022 FINDINGS: Medial compartmental articular space narrowing with marginal spurring. Tele femoral spurring. Appearance compatible with osteoarthritis. Indeterminate for knee effusion due to the exaggerated knee flexion on the lateral projection. Mild spurring along the tibial spine and intercondylar notch. No fracture or acute bony findings. IMPRESSION: 1. Medial compartmental osteoarthritis. 2. Indeterminate for knee effusion due to the exaggerated knee flexion on the lateral projection. Electronically Signed   By: Freida Jes M.D.   On: 12/01/2023 10:43    Procedures Procedures    Medications Ordered in ED Medications  oxyCODONE -acetaminophen  (PERCOCET/ROXICET) 5-325 MG per tablet 2 tablet (2 tablets Oral Given 12/01/23 1610)    ED Course/ Medical Decision Making/ A&P                                 Medical Decision Making Amount and/or Complexity of Data Reviewed Radiology: ordered.  Risk Prescription drug management.    This patient presents to the ED for concern of knee pain differential diagnosis includes,, fracture, hemarthrosis, torn meniscus or internal derangement of the knee    Additional history obtained:  Additional history obtained  from electronic medical record External records from outside source obtained and reviewed including MRI of the right knee performed in 2023 about 2 years ago, showed that there was lateral compartment osteoarthritis and degenerative tearing of the lateral meniscus   Imaging Studies ordered:  I ordered imaging studies including x-ray of the right knee I independently visualized and interpreted imaging which showed mild effusion, no frzcture I agree with the radiologist interpretation   Medicines ordered and prescription drug management:  I ordered medication including Percocet for pain Reevaluation  of the patient after these medicines showed that the patient improved I have reviewed the patients home medicines and have made adjustments as needed   Problem List / ED Course:  Patient informed of all of her results, placed in a knee immobilizer and given crutches for ambulation Has good follow-up with Dr. Iline Mallory in the outpatient setting   Social Determinants of Health:   None I did discuss the results with the family member the patient's permission and he will be there to help her over the next few days          Final Clinical Impression(s) / ED Diagnoses Final diagnoses:  Acute pain of right knee    Rx / DC Orders ED Discharge Orders          Ordered    naproxen (NAPROSYN) 500 MG tablet  2 times daily with meals        12/01/23 1049              Early Glisson, MD 12/01/23 1050

## 2023-12-05 ENCOUNTER — Ambulatory Visit: Admitting: Orthopaedic Surgery

## 2023-12-05 ENCOUNTER — Encounter: Payer: Self-pay | Admitting: Orthopaedic Surgery

## 2023-12-05 VITALS — BP 136/76 | Ht 63.0 in | Wt 136.0 lb

## 2023-12-05 DIAGNOSIS — G8929 Other chronic pain: Secondary | ICD-10-CM | POA: Diagnosis not present

## 2023-12-05 DIAGNOSIS — M25561 Pain in right knee: Secondary | ICD-10-CM

## 2023-12-05 DIAGNOSIS — M545 Low back pain, unspecified: Secondary | ICD-10-CM | POA: Diagnosis not present

## 2023-12-05 NOTE — Progress Notes (Signed)
 My knee really hurts.  She got up Saturday and had marked right knee pain.  She could hardly bend the knee.  She went to the ER at [facility].  X-rays were done. She was given pain medicine and a knee immobilizer.  She had swelling then and difficulty moving the knee.  She had no trauma.  She has had a known meniscus tear on the left from MRI done 03-16-23.  Findings then were: IMPRESSION: 1. Progressive degeneration and degenerative tearing of the anterior horn of the lateral meniscus with associated parameniscal cyst formation posteriorly in Hoffa's fat. 2. Mild degenerative undersurface tearing of the posterior horn of the medial meniscus. 3. The cruciate and collateral ligaments are intact. 4. No acute osseous findings. Minimal degenerative chondrosis in the medial and lateral compartments.  She is moving the knee better today.  She has just minimal effusion and crepitus.  ROM is 0 to 105.  She is fearful about putting full weight on the knee but she can.  The knee immobilizer is down to the ankle.  NV intact.  Medial joint pain,  Positive medial [place]ray.  I had originally wanted her to have a new MRI but having reviewed this and the study was done in September (I had thought much earlier in year), I will have her see Dr. Phyllis Breeze and discuss possible arthroscopy of the knee.  She declined arthroscopy last year as she got better.  She was scheduled to see her today for her back.  She had MRI of the lumbar spine showing: IMPRESSION: 1. 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 2. Mild spinal stenosis and mild left neural foraminal stenosis at L3-4. 3. Mild lateral recess stenosis at L2-3 and L4-5. 4. Unchanged moderate right neural foraminal stenosis at L5-S1.  I have explained the findings to her.  She says her back is better now and she is concerned about the knee.  I have independently reviewed the MRI.    Encounter Diagnoses  Name Primary?   Chronic pain  of right knee Yes   Lumbar pain    I will have her see Dr. Phyllis Breeze for the knee.  Call if any problem.  Precautions discussed.  Electronically Signed Pleasant Brilliant, MD 5/28/202510:13 AM

## 2023-12-10 ENCOUNTER — Telehealth: Payer: Self-pay | Admitting: Orthopaedic Surgery

## 2023-12-10 NOTE — Telephone Encounter (Signed)
 Pt is aware we are not going to set up for MRI instead Dr. Linnell Richardson wants her to followup with Dr. Phyllis Breeze in our office

## 2023-12-10 NOTE — Telephone Encounter (Signed)
 Dr. Vicente Graham pt - pt stated she saw Dr. Linnell Richardson last week and he wanted her to have a MRI on her right knee.  She called to schedule and they stated they don't have an order.  She wants to know what she needs to do and she wants to know if insurance has approved it.  [phone]

## 2023-12-12 NOTE — Telephone Encounter (Signed)
I have called the patient and scheduled her 

## 2023-12-19 ENCOUNTER — Ambulatory Visit: Admitting: Orthopedic Surgery

## 2023-12-19 ENCOUNTER — Encounter: Payer: Self-pay | Admitting: Orthopedic Surgery

## 2023-12-19 VITALS — BP 116/80 | HR 81 | Ht 63.0 in | Wt 136.0 lb

## 2023-12-19 DIAGNOSIS — M1711 Unilateral primary osteoarthritis, right knee: Secondary | ICD-10-CM

## 2023-12-19 DIAGNOSIS — M23300 Other meniscus derangements, unspecified lateral meniscus, right knee: Secondary | ICD-10-CM

## 2023-12-19 NOTE — Progress Notes (Signed)
 Chief Complaint  Patient presents with   Knee Pain    Right    BP 116/80   Pulse 81   Ht 5' 3 (1.6 m)   Wt 136 lb (61.7 kg)   BMI 24.09 kg/m   Encounter Diagnoses  Name Primary?   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     Allergies  Allergen Reactions   Codeine Itching and Nausea And Vomiting   Molds & Smuts Other (See Comments)    Reaction:  Itchy, watery eyes    Zocor [Simvastatin] Nausea And Vomiting and Rash     Anne Davenport is an 83 year old female 2-year history of right knee pain for which she was treated with usual nonoperative measures including injection  She had an increase amount of pain in the knee after previous MRI in 2024 showed she had a torn lateral meniscus  She was reevaluated and felt that the meniscal tear had worsened and she was referred to me for possible surgical intervention  Her main complaints now are global knee pain lateral side greater than medial  MRI and imaging studies show osteoarthritis of the knee.  I feel there is too much arthritis to recommend lateral meniscectomy as definitive management as I think she would still have pain  We discussed this after reviewing the images with her  I recommend a total knee arthroplasty instead of a knee arthroscopy with lateral meniscectomy.  However, if she wants to proceed with lateral meniscectomy instead to see if it will give her pain relief I am amenable to that as well.  She is going on vacation when she gets back she will call me and let me know which procedure she would like to do and at that time we can have her come in for preop evaluation long-leg films and discussion on the risks and benefits of the procedure if its knee replacement  Encounter Diagnoses  Name Primary?   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

## 2023-12-19 NOTE — Progress Notes (Signed)
   BP 116/80   Pulse 81   Ht 5' 3 (1.6 m)   Wt 136 lb (61.7 kg)   BMI 24.09 kg/m   Body mass index is 24.09 kg/m.  Chief Complaint  Patient presents with   Knee Pain    Right     No diagnosis found.  DOI/DOS/ Date: 2 years has had increased right knee pain here for surgical consult   Unchanged

## 2023-12-26 ENCOUNTER — Ambulatory Visit: Admitting: Internal Medicine

## 2024-01-24 ENCOUNTER — Ambulatory Visit: Admitting: Orthopedic Surgery

## 2024-01-24 DIAGNOSIS — M1711 Unilateral primary osteoarthritis, right knee: Secondary | ICD-10-CM

## 2024-01-24 MED ORDER — NAPROXEN 500 MG PO TABS: 500.0000 mg | ORAL_TABLET | Freq: Two times a day (BID) | ORAL | 0 refills | Status: DC

## 2024-01-24 MED ORDER — METHYLPREDNISOLONE ACETATE 40 MG/ML IJ SUSP
40.0000 mg | Freq: Once | INTRAMUSCULAR | Status: AC
  Administered 2024-01-24: 40 mg via INTRA_ARTICULAR

## 2024-01-24 NOTE — Progress Notes (Signed)
   There were no vitals taken for this visit.  There is no height or weight on file to calculate BMI.  Chief Complaint  Patient presents with   Knee Pain

## 2024-01-24 NOTE — Progress Notes (Signed)
 Follow-up visit for Anne Davenport  She has osteoarthritis of the right knee and torn meniscus, torn lateral meniscus  She was considering surgical intervention and was considering arthroscopic surgery versus total knee arthroplasty  She is still having pain and swelling in her right knee, she presents now to discuss whether or not she wants to have knee replacement and is also considering hyaluronic acid injection  She is making it pretty good she says occasionally she will limp she can make it up the stairs but has some trouble going down  Exam of the right knee shows a large joint effusion.  She feels tightness when she tries to flex the knee and gets really tight at about 95 degrees of flexion and has trouble extending her knee  We did do an aspiration injection of the right knee  We will restart her anti-inflammatories which seem to help and order hyaluronic acid.  Procedure note injection and aspiration right knee joint  Verbal consent was obtained to aspirate and inject the right knee joint   Timeout was completed to confirm the site of aspiration and injection  An 18-gauge needle was used to aspirate the knee joint from a suprapatellar lateral approach.  The medications used were 40 mg of Depo-Medrol  and 1% lidocaine  3 cc  Anesthesia was provided by ethyl chloride and the skin was prepped with alcohol.  After cleaning the skin with alcohol an 18-gauge needle was used to aspirate the right knee joint.  We obtained 25  cc of fluid CLR YELL  We follow this by injection of 40 mg of Depo-Medrol  and 3 cc 1% lidocaine .  There were no complications. A sterile bandage was applied.  Meds ordered this encounter  Medications   methylPREDNISolone  acetate (DEPO-MEDROL ) injection 40 mg   naproxen  (NAPROSYN ) 500 MG tablet    Sig: Take 1 tablet (500 mg total) by mouth 2 (two) times daily with a meal.    Dispense:  60 tablet    Refill:  0

## 2024-01-31 ENCOUNTER — Other Ambulatory Visit: Payer: Self-pay

## 2024-01-31 NOTE — Telephone Encounter (Unsigned)
 Copied from CRM [id number]. Topic: Clinical - Medication Refill >> Jan 31, 2024  3:26 PM Montie POUR wrote: Medication: DOTTI 0.1 MG/24HR patch  Has the patient contacted their pharmacy? Yes (Agent: If no, request that the patient contact the pharmacy for the refill. If patient does not wish to contact the pharmacy document the reason why and proceed with request.) (Agent: If yes, when and what did the pharmacy advise?) Pharmacy needs order to refill  This is the patient's preferred pharmacy:  [facility] - [place], [place] - [address] [place] [address] Phone: [phone] Fax: [phone]   Is this the correct pharmacy for this prescription? Yes If no, delete pharmacy and type the correct one.   Has the prescription been filled recently? No  Is the patient out of the medication? Yes - She is having bad hot flashes   Has the patient been seen for an appointment in the last year OR does the patient have an upcoming appointment? Yes  Can we respond through MyChart? Yes  Agent: Please be advised that Rx refills may take up to 3 business days. We ask that you follow-up with your pharmacy.

## 2024-02-01 MED ORDER — DOTTI 0.1 MG/24HR TD PTTW: 1.0000 | MEDICATED_PATCH | TRANSDERMAL | 5 refills | Status: DC

## 2024-02-05 ENCOUNTER — Telehealth: Payer: Self-pay

## 2024-02-05 ENCOUNTER — Other Ambulatory Visit: Payer: Self-pay | Admitting: Internal Medicine

## 2024-02-05 ENCOUNTER — Other Ambulatory Visit: Payer: Self-pay

## 2024-02-05 NOTE — Telephone Encounter (Signed)
 Copied from CRM [id number]. Topic: Clinical - Prescription Issue >> Feb 05, 2024 11:34 AM Carlatta H wrote: Reason for CRM: Patients needs the .75 DOTTI  0.1 MG/24HR patch [[id number]]//Uyz 1mg  patch is too strong for the patient//Please call the patient to advise

## 2024-02-05 NOTE — Telephone Encounter (Signed)
 VOB submitted for Durolane, right knee.

## 2024-02-06 ENCOUNTER — Encounter: Payer: Self-pay | Admitting: Emergency Medicine

## 2024-02-06 ENCOUNTER — Other Ambulatory Visit: Payer: Self-pay

## 2024-02-06 ENCOUNTER — Telehealth: Payer: Self-pay

## 2024-02-06 ENCOUNTER — Ambulatory Visit
Admission: EM | Admit: 2024-02-06 | Discharge: 2024-02-06 | Disposition: A | Discharge status: Home / Self Care | Attending: Family Medicine | Admitting: Family Medicine

## 2024-02-06 DIAGNOSIS — F325 Major depressive disorder, single episode, in full remission: Secondary | ICD-10-CM

## 2024-02-06 DIAGNOSIS — N951 Menopausal and female climacteric states: Secondary | ICD-10-CM

## 2024-02-06 DIAGNOSIS — S91209A Unspecified open wound of unspecified toe(s) with damage to nail, initial encounter: Secondary | ICD-10-CM

## 2024-02-06 MED ORDER — VENLAFAXINE HCL ER 37.5 MG PO CP24: 37.5000 mg | ORAL_CAPSULE | Freq: Every day | ORAL | 1 refills | Status: DC

## 2024-02-06 MED ORDER — BACITRACIN 500 UNIT/GM EX OINT
1.0000 | TOPICAL_OINTMENT | Freq: Once | CUTANEOUS | Status: AC
  Administered 2024-02-06: 1 via TOPICAL

## 2024-02-06 MED ORDER — AMOXICILLIN-POT CLAVULANATE 875-125 MG PO TABS: 1.0000 | ORAL_TABLET | Freq: Two times a day (BID) | ORAL | 0 refills | Status: DC

## 2024-02-06 MED ORDER — MUPIROCIN 2 % EX OINT: 1.0000 | TOPICAL_OINTMENT | Freq: Two times a day (BID) | CUTANEOUS | 0 refills | Status: DC

## 2024-02-06 MED ORDER — CHLORHEXIDINE GLUCONATE 4 % EX SOLN: Freq: Every day | CUTANEOUS | 0 refills | Status: DC | PRN

## 2024-02-06 MED ORDER — ESTRADIOL 0.075 MG/24HR TD PTTW: 1.0000 | MEDICATED_PATCH | TRANSDERMAL | 12 refills | Status: AC

## 2024-02-06 NOTE — Telephone Encounter (Signed)
 Patient came into the office said been waiting on this medication the 1% patch is too strong. Please contact patient at [phone] and advise the patient.

## 2024-02-06 NOTE — Discharge Instructions (Signed)
 In the area once to twice daily with the Hibiclens  solution and apply the mupirocin  ointment and a nonstick dressing that will keep the nail in place.  The nail should grow out over time as the nailbed is still attached.  Take the full course of antibiotics, follow-up for worsening symptoms at any point.  Follow-up with podiatry for recheck in the next 1 to 2 weeks

## 2024-02-06 NOTE — ED Provider Notes (Signed)
 [facility]    CSN: [id number] Arrival date & time: 02/06/24  1348      History   Chief Complaint No chief complaint on file.   HPI Anne Davenport is a 83 y.o. female.   Patient presenting today with an avulsion injury to the left great toenail after moving a box that fell onto her toe pulling the nail backward earlier today.  Denies significant bleeding or drainage, numbness, tingling, decreased range of motion in the toe.  So far keeping the area clean and covered, otherwise not try anything over-the-counter for symptoms.  Last Tdap was in 2021.    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   Screening for STD (sexually transmitted disease) 07/23/2015    Patient Active Problem List   Diagnosis Date Noted   Vasomotor symptoms due to menopause 08/27/2023   Mass of right submandibular region 08/27/2023   Chest pressure 03/14/2023   Family history of coronary artery disease in daughter 03/14/2023   Prediabetes 02/22/2023   MDD (major depressive disorder) 02/22/2023   Insomnia 02/22/2023   Hyperlipidemia 02/22/2023   Chronic musculoskeletal pain 02/22/2023   Intermittent chest pain 02/22/2023   Herpes 02/01/2023   Vesicles 02/01/2023   Burning with urination 01/13/2022   Hematuria 01/13/2022   Urinary frequency 01/13/2022   S/P hysterectomy 01/13/2022   Boil, vulva 01/30/2020   RLQ abdominal pain 10/23/2019   Yeast vaginitis 10/23/2019   Vaginal irritation 10/23/2019   Bladder pain 10/23/2019   Type 2 diabetes mellitus with diabetic neuropathy (HCC) 03/13/2019   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   Right knee  pain 08/18/2015   Right shoulder pain 08/18/2015   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   Screening for STD (sexually transmitted disease) 07/23/2015   Bartholin cyst 05/20/2015   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, 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   Iatrogenic hypothyroidism 04/09/2015   High cholesterol 09/07/2014    Past Surgical History:  Procedure Laterality Date   ABDOMINAL HYSTERECTOMY     BLADDER REPAIR     COLONOSCOPY N/A 06/24/2015   Procedure: COLONOSCOPY;  Surgeon: Claudis RAYMOND Rivet, MD;  Location: [facility];  Service: Endoscopy;  Laterality: N/A;  730   ESOPHAGOGASTRODUODENOSCOPY N/A 08/14/2014   Procedure: ESOPHAGOGASTRODUODENOSCOPY (EGD);  Surgeon: Claudis RAYMOND Rivet, MD;  Location: [facility];  Service: Endoscopy;  Laterality: N/A;  1:10   TONSILLECTOMY     WRIST SURGERY Left     OB History     Gravida  4   Para  3   Term      Preterm      AB  1   Living  2      SAB  1   IAB      Ectopic      Multiple      Live Births               Home Medications    Prior to Admission medications   Medication Sig Start Date End Date Taking? Authorizing Provider  amoxicillin -clavulanate (AUGMENTIN ) 875-125 MG tablet Take 1 tablet by mouth every 12 (twelve)  hours. 02/06/24  Yes Stuart Vernell Norris, PA-C  chlorhexidine  (HIBICLENS ) 4 % external liquid Apply topically daily as needed. 02/06/24  Yes Stuart Vernell Norris, PA-C  mupirocin  ointment (BACTROBAN ) 2 % Apply 1 Application topically 2 (two) times daily. 02/06/24  Yes Stuart Vernell Norris, PA-C  acetaminophen  (TYLENOL ) 500 MG tablet Take 500 mg by mouth every 6 (six) hours as needed for mild pain, moderate pain or headache.    [provider]  Calcium Carbonate-Vitamin D  600-400 MG-UNIT tablet Take 1 tablet by mouth.    [provider]  Cholecalciferol (VITAMIN D3) 50 MCG (2000 UT) capsule Take 2,000 Units by mouth daily.    [provider]  DOTTI  0.1  MG/24HR patch Place 1 patch (0.1 mg total) onto the skin 2 (two) times a week. 02/04/24   Bevely Doffing, FNP  fluticasone (FLONASE) 50 MCG/ACT nasal spray Place 2 sprays into the nose daily as needed for rhinitis.     [provider]  HYDROcodone -acetaminophen  (NORCO/VICODIN) 5-325 MG tablet One tablet every four hours as needed for acute pain.  Limit of five days per [place] statue. 11/14/22   Brenna Lin, MD  levothyroxine  (SYNTHROID ) 75 MCG tablet TAKE ONE (1) TABLET BY MOUTH EVERY DAY IN THE MORNING ON AN EMPTY STOMACH 02/05/24   Bevely Doffing, FNP  lidocaine  (LIDODERM ) 5 % Place 1 patch onto the skin daily. Remove & Discard patch within 12 hours or as directed by MD 11/14/23   Brenna Lin, MD  LORazepam  (ATIVAN ) 0.5 MG tablet TAKE ONE-HALF TO ONE TABLET BY MOUTH AT BEDTIME 10/11/23   Dixon, Phillip E, MD  lovastatin (MEVACOR) 40 MG tablet TAKE ONE (1) TABLET BY MOUTH EVERY DAY 11/06/23   Melvenia Manus BRAVO, MD  naproxen  (NAPROSYN ) 500 MG tablet Take 1 tablet (500 mg total) by mouth 2 (two) times daily with a meal. 01/24/24   Margrette Taft BRAVO, MD  Naproxen  Sodium (ALEVE  PO) Take 1 tablet by mouth as needed (back pain).     [provider]  tiZANidine  (ZANAFLEX ) 4 MG tablet TAKE ONE TABLET BY MOUTH EVERY NIGHT BEFORE BED AS NEEDED FOR SPASMS 11/28/23   Brenna Lin, MD  valACYclovir  (VALTREX ) 1000 MG tablet Take 1 tablet (1,000 mg total) by mouth 2 (two) times daily. 02/01/23   Signa Delon LABOR, NP  venlafaxine  XR (EFFEXOR -XR) 37.5 MG 24 hr capsule Take 37.5 mg by mouth daily. 11/07/21   [provider]    Family History Family History  Problem Relation Age of Onset   Diabetes Mother    Hypertension Mother    Alcohol abuse Brother    Cancer Daughter        breast   Hodgkin's lymphoma Daughter    Hypertension Daughter    Heart disease Daughter    Diabetes Maternal Grandfather    Heart disease Maternal Grandfather    Other Paternal Grandfather        poor  circulation    Social History Social History   Tobacco Use   Smoking status: Former    Current packs/day: 0.00    Average packs/day: 0.5 packs/day for 10.0 years (5.0 ttl pk-yrs)    Types: Cigarettes    Start date: 07/09/1974    Quit date: 07/09/1984    Years since quitting: 39.6   Smokeless tobacco: Never  Vaping Use   Vaping status: Never Used  Substance Use Topics   Alcohol use: Yes    Alcohol/week: 0.0 standard drinks of alcohol    Comment:  once in a while   Drug use: No     Allergies   Codeine, Molds & smuts, and Zocor [simvastatin]   Review of Systems Review of Systems Per HPI  Physical Exam Triage Vital Signs ED Triage Vitals  Encounter Vitals Group     BP 02/06/24 1357 124/75     Girls Systolic BP Percentile --      Girls Diastolic BP Percentile --      Boys Systolic BP Percentile --      Boys Diastolic BP Percentile --      Pulse Rate 02/06/24 1357 76     Resp 02/06/24 1357 18     Temp 02/06/24 1357 97.6 F (36.4 C)     Temp Source 02/06/24 1357 Oral     SpO2 02/06/24 1357 97 %     Weight --      Height --      Head Circumference --      Peak Flow --      Pain Score 02/06/24 1359 6     Pain Loc --      Pain Education --      Exclude from Growth Chart --    No data found.  Updated Vital Signs BP 124/75 (BP Location: Right Arm)   Pulse 76   Temp 97.6 F (36.4 C) (Oral)   Resp 18   SpO2 97%   Visual Acuity Right Eye Distance:   Left Eye Distance:   Bilateral Distance:    Right Eye Near:   Left Eye Near:    Bilateral Near:     Physical Exam Vitals and nursing note reviewed.  Constitutional:      Appearance: Normal appearance. She is not ill-appearing.  HENT:     Head: Atraumatic.  Eyes:     Extraocular Movements: Extraocular movements intact.     Conjunctiva/sclera: Conjunctivae normal.  Cardiovascular:     Rate and Rhythm: Normal rate.  Pulmonary:     Effort: Pulmonary effort is normal.  Musculoskeletal:        General:  Tenderness and signs of injury present. No deformity. Normal range of motion.     Cervical back: Normal range of motion and neck supple.  Skin:    General: Skin is warm.     Comments: Partial avulsion of the left great toenail, nailbed still intact diffusely.  Bleeding controlled  Neurological:     Mental Status: She is alert and oriented to person, place, and time.     Comments: Left lower extremity neurovascularly intact  Psychiatric:        Mood and Affect: Mood normal.        Thought Content: Thought content normal.        Judgment: Judgment normal.      UC Treatments / Results  Labs (all labs ordered are listed, but only abnormal results are displayed) Labs Reviewed - No data to display  EKG   Radiology No results found.  Procedures Procedures (including critical care time)  Medications Ordered in UC Medications  bacitracin  ointment 1 Application (1 Application Topical Given 02/06/24 1502)    Initial Impression / Assessment and Plan / UC Course  I have reviewed the triage vital signs and the nursing notes.  Pertinent labs & imaging results that were available during my care of the patient were reviewed by me and considered in my medical decision making (see chart for details).     Wound cleaned, bacitracin  applied and discussed good home  wound care.  Will treat with course of antibiotics and follow-up with podiatrist in the next 1 to 2 weeks.  Will forego removal of the nail in hopes that it will grow back out in a healthy trajectory.  Return for worsening symptoms.  Final Clinical Impressions(s) / UC Diagnoses   Final diagnoses:  Avulsion of toenail of left foot     Discharge Instructions      In the area once to twice daily with the Hibiclens  solution and apply the mupirocin  ointment and a nonstick dressing that will keep the nail in place.  The nail should grow out over time as the nailbed is still attached.  Take the full course of antibiotics, follow-up  for worsening symptoms at any point.  Follow-up with podiatry for recheck in the next 1 to 2 weeks    ED Prescriptions     Medication Sig Dispense Auth. Provider   amoxicillin -clavulanate (AUGMENTIN ) 875-125 MG tablet Take 1 tablet by mouth every 12 (twelve) hours. 20 tablet Stuart Vernell Norris, PA-C   mupirocin  ointment (BACTROBAN ) 2 % Apply 1 Application topically 2 (two) times daily. 60 g Stuart Vernell Norris, PA-C   chlorhexidine  (HIBICLENS ) 4 % external liquid Apply topically daily as needed. 236 mL Stuart Vernell Norris, [place]      PDMP not reviewed this encounter.   Stuart Vernell Norris, [place] 02/06/24 1505

## 2024-02-06 NOTE — ED Triage Notes (Signed)
 While moving a box, it fell on left great toe knocking the nail back.  Injury happened today.

## 2024-02-06 NOTE — Telephone Encounter (Signed)
 [facility] called stating that pt is allergic to Augmentin . Called pt to verify and she stated that the reaction she has is an upset stomach. Per provider, pt should take medication with food as she did not do in the past. Pt verbalized understanding. Stated she will take Augmentin  with food

## 2024-02-14 DIAGNOSIS — M79672 Pain in left foot: Secondary | ICD-10-CM | POA: Diagnosis not present

## 2024-02-14 DIAGNOSIS — L600 Ingrowing nail: Secondary | ICD-10-CM | POA: Diagnosis not present

## 2024-02-14 DIAGNOSIS — M79675 Pain in left toe(s): Secondary | ICD-10-CM | POA: Diagnosis not present

## 2024-02-25 ENCOUNTER — Ambulatory Visit: Payer: 59 | Admitting: Internal Medicine

## 2024-02-28 ENCOUNTER — Telehealth: Payer: Self-pay

## 2024-02-28 NOTE — Telephone Encounter (Signed)
 Please schedule patient for gel injection with Dr. Margrette.  All information has been added to referral. Thank you.

## 2024-02-29 ENCOUNTER — Encounter: Payer: Self-pay | Admitting: Radiology

## 2024-03-04 ENCOUNTER — Telehealth: Payer: Self-pay | Admitting: Orthopaedic Surgery

## 2024-03-04 DIAGNOSIS — L03031 Cellulitis of right toe: Secondary | ICD-10-CM | POA: Diagnosis not present

## 2024-03-04 DIAGNOSIS — M79671 Pain in right foot: Secondary | ICD-10-CM | POA: Diagnosis not present

## 2024-03-04 DIAGNOSIS — M79674 Pain in right toe(s): Secondary | ICD-10-CM | POA: Diagnosis not present

## 2024-03-06 ENCOUNTER — Ambulatory Visit (INDEPENDENT_AMBULATORY_CARE_PROVIDER_SITE_OTHER): Admitting: Orthopedic Surgery

## 2024-03-06 DIAGNOSIS — M1711 Unilateral primary osteoarthritis, right knee: Secondary | ICD-10-CM | POA: Diagnosis not present

## 2024-03-06 MED ORDER — SODIUM HYALURONATE 60 MG/3ML IX PRSY
60.0000 mg | PREFILLED_SYRINGE | Freq: Once | INTRA_ARTICULAR | Status: AC
  Administered 2024-03-06: 60 mg via INTRA_ARTICULAR

## 2024-03-06 NOTE — Progress Notes (Signed)
 Today is the first injection of Durolane in the right knee for osteoarthritis  Durolane was injected into the right knee with the following technique  First we confirmed that it was the right knee and that it was Durolane  We sprayed the area with ethyl chloride after cleaning with alcohol  We injected the full Durolane  Applied a Band-Aid and then the patient was told to return in 3 months to check on the progress of the knee

## 2024-03-12 ENCOUNTER — Ambulatory Visit: Admitting: Orthopaedic Surgery

## 2024-03-14 ENCOUNTER — Telehealth: Payer: Self-pay

## 2024-03-14 NOTE — Telephone Encounter (Signed)
 Left a message for patient to call the office and get appointments for gel injections scheduled with Dr. Margrette.

## 2024-03-19 ENCOUNTER — Ambulatory Visit: Admitting: Orthopedic Surgery

## 2024-03-19 DIAGNOSIS — M7061 Trochanteric bursitis, right hip: Secondary | ICD-10-CM

## 2024-03-19 MED ORDER — METHYLPREDNISOLONE ACETATE 40 MG/ML IJ SUSP
40.0000 mg | Freq: Once | INTRAMUSCULAR | Status: AC
  Administered 2024-03-19: 40 mg via INTRA_ARTICULAR

## 2024-03-19 NOTE — Addendum Note (Signed)
 Addended byBETHA JENEAN GREIG LELON on: 03/19/2024 03:00 PM   Modules accepted: Orders

## 2024-03-19 NOTE — Progress Notes (Signed)
     Chief Complaint  Patient presents with   Knee Pain    Right- got Durolane 03/06/24 now I have fluid in my knee     Encounter Diagnosis  Name Primary?   Trochanteric bursitis, right hip Yes   83 year old female status post Durolane injection for osteoarthritis right knee comes in with worsening right knee pain  However, upon further questioning the patient is actually having trouble with her hip with pain over the greater trochanter somewhat radiating into the groin.  She is having to pick her leg up to get into the car  She says the knee does not actually hurt but she did notice some swelling and became concerned  Examination of the right knee does reveal an effusion she has a slight deficit in terms of extension but no pain is really noted on range of motion of the right knee  Range of motion of the right hip shows pain with hip flexion internal rotation and adduction and tenderness over the greater trochanter  Patient does have a history of lumbar disc disease previous injections done with good result  Seems to have greater trochanteric bursitis  Recommend injection  If no improvement recheck in a few weeks  Procedure note injection for right hip bursitis  Verbal consent was obtained for injection of the right hip  Timeout was completed to confirm the injection site  The medications used were 40 mg of Depo-Medrol  and 1% lidocaine  3 cc  Anesthesia was provided by ethyl chloride and the skin was prepped with alcohol.  After cleaning the skin with alcohol a 25-gauge needle was used to inject the greater trochanteric bursa right hip   No complications were noted

## 2024-03-19 NOTE — Progress Notes (Signed)
   There were no vitals taken for this visit.  There is no height or weight on file to calculate BMI.  Chief Complaint  Patient presents with   Knee Pain    Right- got Durolane 03/06/24 now I have fluid in my knee

## 2024-03-22 ENCOUNTER — Emergency Department (HOSPITAL_COMMUNITY)

## 2024-03-22 ENCOUNTER — Emergency Department (HOSPITAL_COMMUNITY)
Admission: EM | Admit: 2024-03-22 | Discharge: 2024-03-22 | Disposition: A | Discharge status: Home / Self Care | Attending: Emergency Medicine | Admitting: Emergency Medicine

## 2024-03-22 ENCOUNTER — Other Ambulatory Visit: Payer: Self-pay

## 2024-03-22 ENCOUNTER — Encounter (HOSPITAL_COMMUNITY): Payer: Self-pay | Admitting: Emergency Medicine

## 2024-03-22 DIAGNOSIS — Z87891 Personal history of nicotine dependence: Secondary | ICD-10-CM | POA: Insufficient documentation

## 2024-03-22 DIAGNOSIS — M79661 Pain in right lower leg: Secondary | ICD-10-CM | POA: Diagnosis not present

## 2024-03-22 DIAGNOSIS — I10 Essential (primary) hypertension: Secondary | ICD-10-CM | POA: Diagnosis not present

## 2024-03-22 DIAGNOSIS — Z780 Asymptomatic menopausal state: Secondary | ICD-10-CM | POA: Insufficient documentation

## 2024-03-22 DIAGNOSIS — M50322 Other cervical disc degeneration at C5-C6 level: Secondary | ICD-10-CM | POA: Diagnosis not present

## 2024-03-22 DIAGNOSIS — M160 Bilateral primary osteoarthritis of hip: Secondary | ICD-10-CM | POA: Diagnosis not present

## 2024-03-22 DIAGNOSIS — E039 Hypothyroidism, unspecified: Secondary | ICD-10-CM | POA: Diagnosis not present

## 2024-03-22 DIAGNOSIS — W010XXA Fall on same level from slipping, tripping and stumbling without subsequent striking against object, initial encounter: Secondary | ICD-10-CM | POA: Diagnosis not present

## 2024-03-22 DIAGNOSIS — M47812 Spondylosis without myelopathy or radiculopathy, cervical region: Secondary | ICD-10-CM | POA: Diagnosis not present

## 2024-03-22 DIAGNOSIS — S81811A Laceration without foreign body, right lower leg, initial encounter: Secondary | ICD-10-CM | POA: Diagnosis not present

## 2024-03-22 DIAGNOSIS — M79673 Pain in unspecified foot: Secondary | ICD-10-CM | POA: Diagnosis not present

## 2024-03-22 DIAGNOSIS — M25571 Pain in right ankle and joints of right foot: Secondary | ICD-10-CM | POA: Diagnosis not present

## 2024-03-22 DIAGNOSIS — S0990XA Unspecified injury of head, initial encounter: Secondary | ICD-10-CM | POA: Diagnosis not present

## 2024-03-22 DIAGNOSIS — M47816 Spondylosis without myelopathy or radiculopathy, lumbar region: Secondary | ICD-10-CM | POA: Diagnosis not present

## 2024-03-22 DIAGNOSIS — M25551 Pain in right hip: Secondary | ICD-10-CM | POA: Diagnosis not present

## 2024-03-22 DIAGNOSIS — S199XXA Unspecified injury of neck, initial encounter: Secondary | ICD-10-CM | POA: Diagnosis not present

## 2024-03-22 DIAGNOSIS — M2010 Hallux valgus (acquired), unspecified foot: Secondary | ICD-10-CM | POA: Diagnosis not present

## 2024-03-22 DIAGNOSIS — M2040 Other hammer toe(s) (acquired), unspecified foot: Secondary | ICD-10-CM | POA: Diagnosis not present

## 2024-03-22 DIAGNOSIS — M4312 Spondylolisthesis, cervical region: Secondary | ICD-10-CM | POA: Diagnosis not present

## 2024-03-22 DIAGNOSIS — W19XXXA Unspecified fall, initial encounter: Secondary | ICD-10-CM | POA: Diagnosis not present

## 2024-03-22 MED ORDER — ACETAMINOPHEN 500 MG PO TABS
1000.0000 mg | ORAL_TABLET | Freq: Once | ORAL | Status: AC
  Administered 2024-03-22: 1000 mg via ORAL
  Filled 2024-03-22: qty 2

## 2024-03-22 MED ORDER — LIDOCAINE HCL (PF) 1 % IJ SOLN
30.0000 mL | Freq: Once | INTRAMUSCULAR | Status: AC
  Administered 2024-03-22: 30 mL
  Filled 2024-03-22: qty 30

## 2024-03-22 MED ORDER — HYDROMORPHONE HCL 1 MG/ML IJ SOLN
1.0000 mg | Freq: Once | INTRAMUSCULAR | Status: AC
  Administered 2024-03-22: 1 mg via SUBCUTANEOUS
  Filled 2024-03-22: qty 1

## 2024-03-22 NOTE — Discharge Instructions (Addendum)
 We evaluated you for your fall.  Your testing in the emergency department was reassuring.  We did not see any signs of any broken bones or serious head injuries.  You did have a laceration on your right leg which we repaired with sutures.  Please keep this area clean and dry.  You can wash it gently with soap and water .  Please have these removed in about 10 to 14 days.  Please follow-up with your primary doctor for suture removal.  Please return if you develop any new or worsening symptoms.  Please also return if you develop any redness, swelling, or drainage from your wound as this could be a sign of an infection.

## 2024-03-22 NOTE — ED Notes (Signed)
 Patient transported to radiology

## 2024-03-22 NOTE — ED Provider Notes (Signed)
 [place] [facility]ERGENCY DEPARTMENT AT [facility] Provider Note  CSN: [id number] Arrival date & time: 03/22/24 1004  Chief Complaint(s) Laceration  HPI Anne Davenport is a 83 y.o. female history of hyperlipidemia, hypertension, hypothyroidism, presenting to the emergency department with fall.  Patient reports that she was at a yard sale and she tripped over a bush.  She hit the right side of her head.  She reports some pain mainly in the right anterior shin where she has laceration from the bush.  She also reports pain in her right big toe, right hip.  Reports pain is mild.  Most pain is in the shin.  Denies any headaches, nausea or vomiting.  Denies any chest pain or abdominal pain.  Denies any difficulty breathing.  Denies pain in either of her upper extremities or her left lower extremity.   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   Screening for STD (sexually transmitted disease) 07/23/2015   Patient Active Problem List   Diagnosis Date Noted   Vasomotor symptoms due to menopause 08/27/2023   Mass of right submandibular region 08/27/2023   Chest pressure 03/14/2023   Family history of coronary artery disease in daughter 03/14/2023   Prediabetes 02/22/2023   MDD (major depressive disorder) 02/22/2023   Insomnia 02/22/2023   Hyperlipidemia 02/22/2023   Chronic musculoskeletal pain 02/22/2023   Intermittent chest pain 02/22/2023   Herpes 02/01/2023   Vesicles 02/01/2023   Burning with urination 01/13/2022   Hematuria 01/13/2022   Urinary frequency 01/13/2022   S/P hysterectomy 01/13/2022   Boil, vulva  01/30/2020   RLQ abdominal pain 10/23/2019   Yeast vaginitis 10/23/2019   Vaginal irritation 10/23/2019   Bladder pain 10/23/2019   Type 2 diabetes mellitus with diabetic neuropathy (HCC) 03/13/2019   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   Right knee pain 08/18/2015   Right shoulder pain 08/18/2015   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   Screening for STD (sexually transmitted disease) 07/23/2015   Bartholin cyst 05/20/2015   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 Authorizing Provider  acetaminophen  (TYLENOL ) 500 MG tablet Take 500 mg by mouth every 6 (six) hours as needed for mild pain, moderate pain or headache.    [provider]  amoxicillin -clavulanate (AUGMENTIN ) 875-125 MG tablet Take 1 tablet by mouth every 12 (twelve) hours. 02/06/24   Stuart Vernell Norris, PA-C  Calcium Carbonate-Vitamin D  600-400 MG-UNIT tablet Take 1 tablet by mouth.    [provider]  chlorhexidine  (HIBICLENS ) 4 % external liquid Apply topically daily as needed. 02/06/24   Stuart Vernell Norris, PA-C  Cholecalciferol (VITAMIN D3) 50 MCG (2000 UT) capsule Take 2,000 Units by mouth daily.    [provider]  estradiol  (DOTTI ) 0.075 MG/24HR Place 1 patch onto the skin 2 (two) times a week. 02/07/24   Bevely Doffing, FNP  fluticasone (FLONASE) 50 MCG/ACT nasal spray Place 2 sprays into the nose daily as needed  for rhinitis.     [provider]  HYDROcodone -acetaminophen  (NORCO/VICODIN) 5-325 MG tablet TAKE ONE TABLET BY MOUTH EVERY 4 HOURS AS NEEDED FOR UP TO 5 DAYS FOR MODERATE PAIN (PAIN SCORE 4-6) 03/04/24   Brenna Lin, MD  levothyroxine  (SYNTHROID ) 75 MCG tablet TAKE ONE (1) TABLET BY MOUTH EVERY DAY IN THE MORNING ON AN EMPTY STOMACH 02/05/24   Bevely Doffing,  FNP  lidocaine  (LIDODERM ) 5 % Place 1 patch onto the skin daily. Remove & Discard patch within 12 hours or as directed by MD 11/14/23   Brenna Lin, MD  LORazepam  (ATIVAN ) 0.5 MG tablet TAKE ONE-HALF TO ONE TABLET BY MOUTH AT BEDTIME 10/11/23   Dixon, Phillip E, MD  lovastatin (MEVACOR) 40 MG tablet TAKE ONE (1) TABLET BY MOUTH EVERY DAY 11/06/23   Dixon, Phillip E, MD  mupirocin  ointment (BACTROBAN ) 2 % Apply 1 Application topically 2 (two) times daily. 02/06/24   Stuart Vernell Norris, PA-C  naproxen  (NAPROSYN ) 500 MG tablet Take 1 tablet (500 mg total) by mouth 2 (two) times daily with a meal. 01/24/24   Margrette Taft BRAVO, MD  Naproxen  Sodium (ALEVE  PO) Take 1 tablet by mouth as needed (back pain).     [provider]  tiZANidine  (ZANAFLEX ) 4 MG tablet TAKE ONE TABLET BY MOUTH EVERY NIGHT BEFORE BED AS NEEDED FOR SPASMS 11/28/23   Brenna Lin, MD  valACYclovir  (VALTREX ) 1000 MG tablet Take 1 tablet (1,000 mg total) by mouth 2 (two) times daily. 02/01/23   Signa Delon LABOR, NP  venlafaxine  XR (EFFEXOR -XR) 37.5 MG 24 hr capsule Take 1 capsule (37.5 mg total) by mouth daily. 02/06/24   Bevely Doffing, FNP                                                                                                                                    Past Surgical History Past Surgical History:  Procedure Laterality Date   ABDOMINAL HYSTERECTOMY     BLADDER REPAIR     COLONOSCOPY N/A 06/24/2015   Procedure: COLONOSCOPY;  Surgeon: Claudis RAYMOND Rivet, MD;  Location: [facility];  Service: Endoscopy;  Laterality: N/A;  730   ESOPHAGOGASTRODUODENOSCOPY N/A 08/14/2014   Procedure: ESOPHAGOGASTRODUODENOSCOPY (EGD);  Surgeon: Claudis RAYMOND Rivet, MD;  Location: [facility];  Service: Endoscopy;  Laterality: N/A;  1:10   TONSILLECTOMY     WRIST SURGERY Left    Family History Family History  Problem Relation Age of Onset   Diabetes Mother    Hypertension Mother    Alcohol abuse Brother    Cancer Daughter         breast   Hodgkin's lymphoma Daughter    Hypertension Daughter    Heart disease Daughter    Diabetes Maternal Grandfather    Heart disease Maternal Grandfather    Other Paternal Grandfather        poor circulation    Social History Social History  Tobacco Use   Smoking status: Former    Current packs/day: 0.00    Average packs/day: 0.5 packs/day for 10.0 years (5.0 ttl pk-yrs)    Types: Cigarettes    Start date: 07/09/1974    Quit date: 07/09/1984    Years since quitting: 39.7   Smokeless tobacco: Never  Vaping Use   Vaping status: Never Used  Substance Use Topics   Alcohol use: Yes    Alcohol/week: 0.0 standard drinks of alcohol    Comment: once in a while   Drug use: No   Allergies Codeine, Molds & smuts, and Zocor [simvastatin]  Review of Systems Review of Systems  All other systems reviewed and are negative.   Physical Exam Vital Signs  I have reviewed the triage vital signs BP (!) 175/92 (BP Location: Left Arm)   Pulse 65   Temp (!) 97.4 F (36.3 C) (Axillary)   Resp (!) 21   Ht 5' 3 (1.6 m)   Wt 68 kg   SpO2 100%   BMI 26.57 kg/m  Physical Exam Vitals and nursing note reviewed.  Constitutional:      General: She is not in acute distress.    Appearance: She is well-developed.  HENT:     Head: Normocephalic and atraumatic.     Mouth/Throat:     Mouth: Mucous membranes are moist.  Eyes:     Pupils: Pupils are equal, round, and reactive to light.  Cardiovascular:     Rate and Rhythm: Normal rate and regular rhythm.     Heart sounds: No murmur heard. Pulmonary:     Effort: Pulmonary effort is normal. No respiratory distress.     Breath sounds: Normal breath sounds.  Abdominal:     General: Abdomen is flat.     Palpations: Abdomen is soft.     Tenderness: There is no abdominal tenderness.  Musculoskeletal:        General: No tenderness.     Right lower leg: No edema.     Left lower leg: No edema.     Comments: No midline C, T,  L-spine tenderness.  No chest wall tenderness or crepitus.  Full range of motion of bilateral upper extremities with no focal tenderness or deformity.  Left lower extremity without any focal tenderness or deformity.  Full range of motion of the right hip with mild tenderness of the right hip.  No tenderness or limitation in range of motion of the knee, ankle.  Mild tenderness over the right great toe.  No deformity.  Skin:    General: Skin is warm and dry.     Comments: Large V-shaped approximately 20 cm laceration over the right anterior shin without exposed bone  Neurological:     General: No focal deficit present.     Mental Status: She is alert. Mental status is at baseline.  Psychiatric:        Mood and Affect: Mood normal.        Behavior: Behavior normal.     ED Results and Treatments Labs (all labs ordered are listed, but only abnormal results are displayed) Labs Reviewed - No data to display  Radiology No results found.  Pertinent labs & imaging results that were available during my care of the patient were reviewed by me and considered in my medical decision making (see MDM for details).  Medications Ordered in ED Medications  HYDROmorphone  (DILAUDID ) injection 1 mg (has no administration in time range)  acetaminophen  (TYLENOL ) tablet 1,000 mg (has no administration in time range)                                                                                                                                     Procedures Procedures  (including critical care time)  Medical Decision Making / ED Course   MDM:  83 year old presenting to the emergency department for laceration.  Patient overall well-appearing, physical examination with laceration, some tenderness in the right lower extremity.  Will obtain CT head and neck as she did hit her head although  no external signs of trauma.  Also obtain some x-rays of the right lower extremity.  No other signs of trauma on exam.  Tdap is up-to-date.  Will treat pain.  Will repair laceration after irrigation.  Will reassess.  If imaging negative anticipate discharge.      Additional history obtained: -Additional history obtained from {wsadditionalhistorian:28072} -External records from outside source obtained and reviewed including: Chart review including previous notes, labs, imaging, consultation notes including ***   Lab Tests: -I ordered, reviewed, and interpreted labs.   The pertinent results include:   Labs Reviewed - No data to display  Notable for ***  EKG   EKG Interpretation Date/Time:    Ventricular Rate:    PR Interval:    QRS Duration:    QT Interval:    QTC Calculation:   R Axis:      Text Interpretation:           Imaging Studies ordered: I ordered imaging studies including *** On my interpretation imaging demonstrates *** I independently visualized and interpreted imaging. I agree with the radiologist interpretation   Medicines ordered and prescription drug management: Meds ordered this encounter  Medications   HYDROmorphone  (DILAUDID ) injection 1 mg   acetaminophen  (TYLENOL ) tablet 1,000 mg    -I have reviewed the patients home medicines and have made adjustments as needed   Consultations Obtained: I requested consultation with the ***,  and discussed lab and imaging findings as well as pertinent plan - they recommend: ***   Cardiac Monitoring: The patient was maintained on a cardiac monitor.  I personally viewed and interpreted the cardiac monitored which showed an underlying rhythm of: ***  Social Determinants of Health:  Diagnosis or treatment significantly limited by social determinants of health: {wssoc:28071}   Reevaluation: After the interventions noted above, I reevaluated the patient and found that their symptoms have  {resolved/improved/worsened:23923::improved}  Co morbidities that complicate the patient evaluation  Past Medical History:  Diagnosis Date   Anxiety    Back pain  mid back   Bartholin cyst 05/20/2015   Family history of adverse reaction to anesthesia    Daughter stopped breathing with Ativan    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   Hyperlipemia    Hypertension    IBS (irritable bowel syndrome)    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  Despite best efforts to proofread,  errors can occur which can change the documentation meaning.

## 2024-03-22 NOTE — ED Notes (Signed)
 Attempted to irrigate the wound but pt was unable to tolerate. MD notified.

## 2024-03-22 NOTE — ED Triage Notes (Signed)
 Pt via EMS from home after a mechanical fall outside with large skin tear to right shin. Wound was wrapped by the time EMS arrived but they were told that bone is visible. Pt is distraught at time of arrival to ED. Vitals WNL, GCS 15.

## 2024-03-24 ENCOUNTER — Encounter (HOSPITAL_COMMUNITY): Payer: Self-pay | Admitting: Emergency Medicine

## 2024-03-24 ENCOUNTER — Other Ambulatory Visit: Payer: Self-pay

## 2024-03-24 ENCOUNTER — Emergency Department (HOSPITAL_COMMUNITY)
Admission: EM | Admit: 2024-03-24 | Discharge: 2024-03-24 | Discharge status: Against Medical Advice | Attending: Emergency Medicine | Admitting: Emergency Medicine

## 2024-03-24 ENCOUNTER — Ambulatory Visit: Payer: Self-pay

## 2024-03-24 DIAGNOSIS — Z4800 Encounter for change or removal of nonsurgical wound dressing: Secondary | ICD-10-CM | POA: Diagnosis not present

## 2024-03-24 DIAGNOSIS — S81801D Unspecified open wound, right lower leg, subsequent encounter: Secondary | ICD-10-CM | POA: Diagnosis not present

## 2024-03-24 DIAGNOSIS — Z5321 Procedure and treatment not carried out due to patient leaving prior to being seen by health care provider: Secondary | ICD-10-CM | POA: Diagnosis not present

## 2024-03-24 DIAGNOSIS — X58XXXD Exposure to other specified factors, subsequent encounter: Secondary | ICD-10-CM | POA: Insufficient documentation

## 2024-03-24 NOTE — Telephone Encounter (Signed)
 Patient scheduled 9/17

## 2024-03-24 NOTE — ED Triage Notes (Signed)
 Pt in by POV for right lower leg wound. Pt had 18 stiches placed on Saturday following a fall. Pt states it has been bleeding since. Also c/o groin pain that started yesterday.

## 2024-03-24 NOTE — Telephone Encounter (Signed)
 FYI Only or Action Required?: FYI only for provider.  Patient was last seen in primary care on 11/30/2023 by Melvenia Manus BRAVO, MD.  Called Nurse Triage reporting Groin Pain and Laceration.  Symptoms began several days ago.  Interventions attempted: Rest, hydration, or home remedies.  Symptoms are: gradually worsening.  Triage Disposition: Go to ED Now (Notify PCP)  Patient/caregiver understands and will follow disposition?: Yes  Copied from CRM #[id number]. Topic: Clinical - Red Word Triage >> Mar 24, 2024 10:26 AM Mercedes MATSU wrote: Red Word that prompted transfer to Nurse Triage: patient called in stating she fell on her leg, ambulance took her to hospital she has 18 stitches in her leg. Bandages was changed yesterday, now having groin pain and bleeding thru bandages. Reason for Disposition  Sounds like a serious injury to the triager  Answer Assessment - Initial Assessment Questions 1. MECHANISM: How did the injury happen? (e.g., twisting injury, direct blow)      Patient reports falling over the weekend-was having a yard sale and was cut by a bush. Ended up having a large laceration to the right leg.  2. ONSET: When did the injury happen? (e.g., minutes, hours ago)      Saturday 3. LOCATION: Where is the injury located?      Bilateral groin area 4. APPEARANCE of INJURY: What does the injury look like?  (e.g., looks normal; bruise, swelling)     Patient unsure.  5. PAIN: Is there pain? If Yes, ask: How bad is the pain?   What does it keep you from doing? (Scale 0-10; or none, mild, moderate, severe)     7 out of 10 6. SIZE: For cuts, bruises, or swelling, ask: How large is it? (e.g., inches or centimeters;  entire joint)      unsure 7. TETANUS: For any breaks in the skin, ask: When was your last tetanus booster?     Tetanus was updated on Saturday when she was seen in the ED. 8. OTHER SYMPTOMS: Do you have any other symptoms?      Bleeding thru the bandages of  her leg.  Patient was seen at the ED on Saturday for fall and laceration to leg. Patient states she received 18 stitches. Patient reports laceration is bleeding through bandages and having groin pain on both sides. Patient is recommended to be seen in the ED for reevaluation.  Protocols used: Groin Injury and Strain-A-AH

## 2024-03-26 ENCOUNTER — Ambulatory Visit

## 2024-03-26 VITALS — BP 130/76 | HR 82 | Ht 63.0 in | Wt 128.0 lb

## 2024-03-26 DIAGNOSIS — E1140 Type 2 diabetes mellitus with diabetic neuropathy, unspecified: Secondary | ICD-10-CM | POA: Diagnosis not present

## 2024-03-26 DIAGNOSIS — R300 Dysuria: Secondary | ICD-10-CM

## 2024-03-26 DIAGNOSIS — E032 Hypothyroidism due to medicaments and other exogenous substances: Secondary | ICD-10-CM | POA: Diagnosis not present

## 2024-03-26 DIAGNOSIS — M1611 Unilateral primary osteoarthritis, right hip: Secondary | ICD-10-CM

## 2024-03-26 DIAGNOSIS — S81811D Laceration without foreign body, right lower leg, subsequent encounter: Secondary | ICD-10-CM

## 2024-03-26 DIAGNOSIS — Z23 Encounter for immunization: Secondary | ICD-10-CM

## 2024-03-26 NOTE — Progress Notes (Signed)
 Established Patient Office Visit  Subjective   Patient ID: Anne Davenport, female    DOB: February 24, 1941  Age: 83 y.o. MRN: [id number]  Chief Complaint  Patient presents with   Transitions Of Care    Transfer of care from Dr Melvenia to Leita   Follow-up    ER follow up    HPI Discussed the use of AI scribe software for clinical note transcription with the patient, who gave verbal consent to proceed.  History of Present Illness   Anne Davenport is an 83 year old female who presents for follow-up after a fall resulting in head and leg injuries.  Fall-related trauma and musculoskeletal pain - Sustained a fall on March 22, 2024, at a yard sale, striking her head on the ground and injuring her right leg, hip, and knee. - Received stitches for lacerations; removal is due in approximately one week. - Initial inability to move her toe and pain in the right ankle, foot, hip, and knee prompted x-rays of these areas. - Persistent pain in the groin region since the fall, resulting in difficulty ambulating, though some improvement is noted. - Soreness present in the tailbone and pelvis. - Uncertainty regarding whether the groin or pelvis was imaged with x-ray.  Head injury evaluation - Struck her head during the fall. - CT scan performed due to concern for possible brain injury.  Urinary symptoms - Experienced urinary incontinence the night of the fall, which has since resolved. - Burning sensation at the end of urination began within the last week, raising concern for urinary tract infection.  Pain management - Currently using Aleve  and Tylenol  for pain control. - Hydrocodone  available but used sparingly, only taking half a dose when necessary. - Received Dilaudid  in the hospital for severe pain following the fall.  Chronic joint pain and interventions - Recent gel injections administered for bone-on-bone knee pain. - Received a hip injection for bursitis.  Hypertension - History of  hypertension.      Patient Active Problem List   Diagnosis Date Noted   Laceration of right lower leg 03/30/2024   Arthritis of right hip 03/30/2024   Vasomotor symptoms due to menopause 08/27/2023   Mass of right submandibular region 08/27/2023   Chest pressure 03/14/2023   Family history of coronary artery disease in daughter 03/14/2023   Prediabetes 02/22/2023   MDD (major depressive disorder) 02/22/2023   Insomnia 02/22/2023   Hyperlipidemia 02/22/2023   Chronic musculoskeletal pain 02/22/2023   Intermittent chest pain 02/22/2023   Herpes 02/01/2023   Vesicles 02/01/2023   Dysuria 01/13/2022   Hematuria 01/13/2022   Urinary frequency 01/13/2022   S/P hysterectomy 01/13/2022   Boil, vulva 01/30/2020   RLQ abdominal pain 10/23/2019   Yeast vaginitis 10/23/2019   Vaginal irritation 10/23/2019   Bladder pain 10/23/2019   Type 2 diabetes mellitus with diabetic neuropathy (HCC) 03/13/2019   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   Right knee pain 08/18/2015   Right shoulder pain 08/18/2015   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   Screening for STD (sexually transmitted disease) 07/23/2015   Bartholin cyst 05/20/2015   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  Constitutional:      Appearance: Normal appearance.  HENT:     Head: Normocephalic.  Eyes:     Extraocular Movements: Extraocular movements intact.     Pupils: Pupils are equal, round, and reactive to  light.  Cardiovascular:     Rate and Rhythm: Normal rate and regular rhythm.  Pulmonary:     Effort: Pulmonary effort is normal.     Breath sounds: Normal breath sounds.  Musculoskeletal:     Cervical back: Normal range of motion and neck supple.  Neurological:     Mental Status: She is alert and oriented to person, place, and time.  Psychiatric:        Mood and Affect: Mood normal.        Thought Content: Thought content normal.        The ASCVD Risk score (Arnett DK, et al., 2019) failed to calculate for the following reasons:   The 2019 ASCVD risk score is only valid for ages 41 to 60    Assessment & Plan:   Problem List Items Addressed This Visit       Endocrine   Iatrogenic hypothyroidism   She remains on levothyroxine  75 mcg daily.  Asymptomatic currently.  TSH & T4 updated today.       Relevant Orders   TSH + free T4 (Completed)   Type 2 diabetes mellitus with diabetic neuropathy (HCC) - Primary   A1c was 7.2 in February.  She would like to continue a dietary approach to diabetes control.  Will recheck labs today.        Relevant Orders   Basic Metabolic Panel (BMET) (Completed)   HgB A1c (Completed)     Musculoskeletal and Integument   Arthritis of right hip   Right hip osteoarthritis and trochanteric bursitis, status post recent injections Pain managed with Aleve  and Tylenol  post-injections. - Continue Aleve  and Tylenol  as needed.        Other   Dysuria   UA negative for signs of infection or other abnormality.  Recommend increased water  intake.  Recommend f/u as needed.        Relevant Orders   Urinalysis (Completed)   Laceration of right lower leg    Right leg laceration with sutures initially treated in ER 5 days ago.   Sutures intact with some oozing, no infection. - Schedule suture removal in 10-12 days from injury. - Advise dressing change every 3-4 days or if soiled.      Other Visit Diagnoses       Need for influenza vaccination        Relevant Orders   Flu vaccine HIGH DOSE PF(Fluzone Trivalent)      Return in about 1 week (around 04/02/2024) for for recheck of symptoms.    Leita Longs, FNP

## 2024-03-27 LAB — BASIC METABOLIC PANEL WITH GFR
BUN/Creatinine Ratio: 21 (ref 12–28)
BUN: 18 mg/dL (ref 8–27)
CO2: 23 mmol/L (ref 20–29)
Calcium: 9.6 mg/dL (ref 8.7–10.3)
Chloride: 100 mmol/L (ref 96–106)
Creatinine, Ser: 0.84 mg/dL (ref 0.57–1.00)
Glucose: 157 mg/dL — ABNORMAL HIGH (ref 70–99)
Potassium: 3.8 mmol/L (ref 3.5–5.2)
Sodium: 138 mmol/L (ref 134–144)
eGFR: 69 mL/min/1.73 (ref >59)

## 2024-03-27 LAB — TSH+FREE T4
Free T4: 1.52 ng/dL (ref 0.82–1.77)
TSH: 1.46 u[IU]/mL (ref 0.450–4.500)

## 2024-03-27 LAB — HEMOGLOBIN A1C
Est. average glucose Bld gHb Est-mCnc: 174 mg/dL
Hgb A1c MFr Bld: 7.7 % — ABNORMAL HIGH (ref 4.8–5.6)

## 2024-03-28 LAB — URINALYSIS
Bilirubin, UA: POSITIVE — AB
Leukocytes,UA: NEGATIVE
Nitrite, UA: NEGATIVE
RBC, UA: NEGATIVE
Specific Gravity, UA: >1.03 — ABNORMAL HIGH (ref 1.005–1.030)
Urobilinogen, Ur: 1 mg/dL (ref 0.2–1.0)
pH, UA: 5.5 (ref 5.0–7.5)

## 2024-03-30 ENCOUNTER — Ambulatory Visit: Payer: Self-pay

## 2024-03-30 DIAGNOSIS — M1611 Unilateral primary osteoarthritis, right hip: Secondary | ICD-10-CM | POA: Insufficient documentation

## 2024-03-30 DIAGNOSIS — S81811A Laceration without foreign body, right lower leg, initial encounter: Secondary | ICD-10-CM | POA: Insufficient documentation

## 2024-03-30 NOTE — Assessment & Plan Note (Addendum)
 UA negative for signs of infection or other abnormality.  Recommend increased water  intake.  Recommend f/u as needed.

## 2024-03-30 NOTE — Assessment & Plan Note (Signed)
 Right leg laceration with sutures initially treated in ER 5 days ago.   Sutures intact with some oozing, no infection. - Schedule suture removal in 10-12 days from injury. - Advise dressing change every 3-4 days or if soiled.

## 2024-03-30 NOTE — Assessment & Plan Note (Signed)
 She remains on levothyroxine  75 mcg daily.  Asymptomatic currently.  TSH & T4 updated today.

## 2024-03-30 NOTE — Assessment & Plan Note (Signed)
 A1c was 7.2 in February.  She would like to continue a dietary approach to diabetes control.  Will recheck labs today.

## 2024-03-30 NOTE — Assessment & Plan Note (Signed)
 Right hip osteoarthritis and trochanteric bursitis, status post recent injections Pain managed with Aleve  and Tylenol  post-injections. - Continue Aleve  and Tylenol  as needed.

## 2024-04-01 ENCOUNTER — Emergency Department (HOSPITAL_COMMUNITY)

## 2024-04-01 ENCOUNTER — Emergency Department (HOSPITAL_COMMUNITY)
Admission: EM | Admit: 2024-04-01 | Discharge: 2024-04-03 | Disposition: A | Discharge status: Home / Self Care | Attending: Emergency Medicine | Admitting: Emergency Medicine

## 2024-04-01 ENCOUNTER — Other Ambulatory Visit: Payer: Self-pay

## 2024-04-01 DIAGNOSIS — S32512A Fracture of superior rim of left pubis, initial encounter for closed fracture: Secondary | ICD-10-CM | POA: Diagnosis not present

## 2024-04-01 DIAGNOSIS — W1839XA Other fall on same level, initial encounter: Secondary | ICD-10-CM | POA: Insufficient documentation

## 2024-04-01 DIAGNOSIS — R9431 Abnormal electrocardiogram [ECG] [EKG]: Secondary | ICD-10-CM | POA: Diagnosis not present

## 2024-04-01 DIAGNOSIS — S32502A Unspecified fracture of left pubis, initial encounter for closed fracture: Secondary | ICD-10-CM | POA: Diagnosis not present

## 2024-04-01 DIAGNOSIS — M1712 Unilateral primary osteoarthritis, left knee: Secondary | ICD-10-CM | POA: Diagnosis not present

## 2024-04-01 DIAGNOSIS — R2681 Unsteadiness on feet: Secondary | ICD-10-CM | POA: Diagnosis not present

## 2024-04-01 DIAGNOSIS — M79605 Pain in left leg: Secondary | ICD-10-CM | POA: Diagnosis not present

## 2024-04-01 DIAGNOSIS — Z043 Encounter for examination and observation following other accident: Secondary | ICD-10-CM | POA: Diagnosis not present

## 2024-04-01 DIAGNOSIS — Z743 Need for continuous supervision: Secondary | ICD-10-CM | POA: Diagnosis not present

## 2024-04-01 DIAGNOSIS — M25562 Pain in left knee: Secondary | ICD-10-CM | POA: Diagnosis not present

## 2024-04-01 DIAGNOSIS — S32592A Other specified fracture of left pubis, initial encounter for closed fracture: Secondary | ICD-10-CM | POA: Diagnosis not present

## 2024-04-01 DIAGNOSIS — I10 Essential (primary) hypertension: Secondary | ICD-10-CM | POA: Insufficient documentation

## 2024-04-01 DIAGNOSIS — S79912A Unspecified injury of left hip, initial encounter: Secondary | ICD-10-CM | POA: Diagnosis present

## 2024-04-01 DIAGNOSIS — S3210XA Unspecified fracture of sacrum, initial encounter for closed fracture: Secondary | ICD-10-CM | POA: Diagnosis not present

## 2024-04-01 DIAGNOSIS — M6281 Muscle weakness (generalized): Secondary | ICD-10-CM | POA: Insufficient documentation

## 2024-04-01 DIAGNOSIS — E1165 Type 2 diabetes mellitus with hyperglycemia: Secondary | ICD-10-CM | POA: Insufficient documentation

## 2024-04-01 DIAGNOSIS — M25552 Pain in left hip: Secondary | ICD-10-CM | POA: Diagnosis not present

## 2024-04-01 LAB — CBC WITH DIFFERENTIAL/PLATELET
Abs Immature Granulocytes: 0.04 K/uL (ref 0.00–0.07)
Basophils Absolute: 0 K/uL (ref 0.0–0.1)
Basophils Relative: 0 %
Eosinophils Absolute: 0.1 K/uL (ref 0.0–0.5)
Eosinophils Relative: 2 %
HCT: 41.1 % (ref 36.0–46.0)
Hemoglobin: 13.5 g/dL (ref 12.0–15.0)
Immature Granulocytes: 1 %
Lymphocytes Relative: 22 %
Lymphs Abs: 1.6 K/uL (ref 0.7–4.0)
MCH: 29 pg (ref 26.0–34.0)
MCHC: 32.8 g/dL (ref 30.0–36.0)
MCV: 88.4 fL (ref 80.0–100.0)
Monocytes Absolute: 0.6 K/uL (ref 0.1–1.0)
Monocytes Relative: 8 %
Neutro Abs: 5 K/uL (ref 1.7–7.7)
Neutrophils Relative %: 67 %
Platelets: 269 K/uL (ref 150–400)
RBC: 4.65 MIL/uL (ref 3.87–5.11)
RDW: 13.4 % (ref 11.5–15.5)
WBC: 7.4 K/uL (ref 4.0–10.5)
nRBC: 0 % (ref 0.0–0.2)

## 2024-04-01 LAB — BASIC METABOLIC PANEL WITH GFR
Anion gap: 15 (ref 5–15)
BUN: 22 mg/dL (ref 8–23)
CO2: 17 mmol/L — ABNORMAL LOW (ref 22–32)
Calcium: 9.1 mg/dL (ref 8.9–10.3)
Chloride: 103 mmol/L (ref 98–111)
Creatinine, Ser: 0.92 mg/dL (ref 0.44–1.00)
GFR, Estimated: >60 mL/min (ref >60)
Glucose, Bld: 206 mg/dL — ABNORMAL HIGH (ref 70–99)
Potassium: 4.4 mmol/L (ref 3.5–5.1)
Sodium: 135 mmol/L (ref 135–145)

## 2024-04-01 LAB — CBG MONITORING, ED: Glucose-Capillary: 85 mg/dL (ref 70–99)

## 2024-04-01 MED ORDER — DOXYCYCLINE HYCLATE 100 MG PO TABS
100.0000 mg | ORAL_TABLET | Freq: Two times a day (BID) | ORAL | Status: DC
Start: 2024-04-01 — End: 2024-04-08
  Administered 2024-04-01 – 2024-04-03 (×4): 100 mg via ORAL
  Filled 2024-04-01 (×4): qty 1

## 2024-04-01 MED ORDER — INSULIN ASPART 100 UNIT/ML IJ SOLN: 0.0000 [IU] | Freq: Every day | INTRAMUSCULAR | Status: DC

## 2024-04-01 MED ORDER — NAPROXEN 250 MG PO TABS
500.0000 mg | ORAL_TABLET | Freq: Two times a day (BID) | ORAL | Status: DC
  Administered 2024-04-01 – 2024-04-03 (×4): 500 mg via ORAL
  Filled 2024-04-01 (×4): qty 2

## 2024-04-01 MED ORDER — ACETAMINOPHEN 500 MG PO TABS: 500.0000 mg | ORAL_TABLET | Freq: Four times a day (QID) | ORAL | Status: DC | PRN

## 2024-04-01 MED ORDER — LORAZEPAM 0.5 MG PO TABS
0.5000 mg | ORAL_TABLET | Freq: Every day | ORAL | Status: DC
  Administered 2024-04-01 – 2024-04-02 (×2): 0.5 mg via ORAL
  Filled 2024-04-01 (×2): qty 1

## 2024-04-01 MED ORDER — LEVOTHYROXINE SODIUM 50 MCG PO TABS
75.0000 ug | ORAL_TABLET | Freq: Every day | ORAL | Status: DC
Start: 2024-04-02 — End: 2024-04-03
  Administered 2024-04-02 – 2024-04-03 (×2): 75 ug via ORAL
  Filled 2024-04-01 (×2): qty 2

## 2024-04-01 MED ORDER — HYDROMORPHONE HCL 1 MG/ML IJ SOLN
0.5000 mg | INTRAMUSCULAR | Status: AC | PRN
  Administered 2024-04-01 (×2): 0.5 mg via INTRAVENOUS
  Filled 2024-04-01 (×2): qty 0.5

## 2024-04-01 MED ORDER — HYDROCODONE-ACETAMINOPHEN 5-325 MG PO TABS
1.0000 | ORAL_TABLET | Freq: Four times a day (QID) | ORAL | Status: DC | PRN
  Administered 2024-04-01: 1 via ORAL
  Filled 2024-04-01: qty 1

## 2024-04-01 MED ORDER — INSULIN ASPART 100 UNIT/ML IJ SOLN
0.0000 [IU] | Freq: Three times a day (TID) | INTRAMUSCULAR | Status: DC
  Administered 2024-04-02: 2 [IU] via SUBCUTANEOUS
  Filled 2024-04-01: qty 1

## 2024-04-01 MED ORDER — OXYCODONE-ACETAMINOPHEN 5-325 MG PO TABS
1.0000 | ORAL_TABLET | Freq: Once | ORAL | Status: AC
  Administered 2024-04-01: 1 via ORAL
  Filled 2024-04-01: qty 1

## 2024-04-01 MED ORDER — VENLAFAXINE HCL ER 37.5 MG PO CP24
37.5000 mg | ORAL_CAPSULE | Freq: Every day | ORAL | Status: DC
  Administered 2024-04-01 – 2024-04-03 (×3): 37.5 mg via ORAL
  Filled 2024-04-01 (×3): qty 1

## 2024-04-01 NOTE — ED Provider Notes (Signed)
 [facility] Provider Note   CSN: [id number] Arrival date & time: 04/01/24  1123     Patient presents with: Hip Pain   Anne Davenport is a 83 y.o. female.    Hip Pain  Patient presents for left hip pain.  She had a fall a week ago and has since had some mild pain in her left hip.  Earlier today, she was bending over a coffee table to reach for her phone.  When she did so, she felt a pop in her left hip and had onset of severe pain.  Pain radiates down anterior aspect of her left leg to her knee.  She has been unable to walk since this time.  She was able to stand with EMS.  She denies any other areas of discomfort.     Prior to Admission medications   Medication Sig Start Date End Date Taking? Authorizing Provider  acetaminophen  (TYLENOL ) 500 MG tablet Take 500 mg by mouth every 6 (six) hours as needed for mild pain, moderate pain or headache.    [provider]  amoxicillin -clavulanate (AUGMENTIN ) 875-125 MG tablet Take 1 tablet by mouth every 12 (twelve) hours. 02/06/24   Stuart Vernell Norris, PA-C  Calcium Carbonate-Vitamin D  600-400 MG-UNIT tablet Take 1 tablet by mouth.    [provider]  chlorhexidine  (HIBICLENS ) 4 % external liquid Apply topically daily as needed. 02/06/24   Stuart Vernell Norris, PA-C  Cholecalciferol (VITAMIN D3) 50 MCG (2000 UT) capsule Take 2,000 Units by mouth daily.    [provider]  estradiol  (DOTTI ) 0.075 MG/24HR Place 1 patch onto the skin 2 (two) times a week. 02/07/24   Bevely Doffing, FNP  fluticasone (FLONASE) 50 MCG/ACT nasal spray Place 2 sprays into the nose daily as needed for rhinitis.     [provider]  HYDROcodone -acetaminophen  (NORCO/VICODIN) 5-325 MG tablet TAKE ONE TABLET BY MOUTH EVERY 4 HOURS AS NEEDED FOR UP TO 5 DAYS FOR MODERATE PAIN (PAIN SCORE 4-6) 03/04/24   Brenna Lin, MD  levothyroxine  (SYNTHROID ) 75 MCG tablet TAKE ONE (1) TABLET BY MOUTH EVERY  DAY IN THE MORNING ON AN EMPTY STOMACH 02/05/24   Bevely Doffing, FNP  lidocaine  (LIDODERM ) 5 % Place 1 patch onto the skin daily. Remove & Discard patch within 12 hours or as directed by MD 11/14/23   Brenna Lin, MD  LORazepam  (ATIVAN ) 0.5 MG tablet TAKE ONE-HALF TO ONE TABLET BY MOUTH AT BEDTIME 10/11/23   Jaqwan Wieber, Phillip E, MD  lovastatin (MEVACOR) 40 MG tablet TAKE ONE (1) TABLET BY MOUTH EVERY DAY 11/06/23   Rande Roylance, Phillip E, MD  mupirocin  ointment (BACTROBAN ) 2 % Apply 1 Application topically 2 (two) times daily. 02/06/24   Stuart Vernell Norris, PA-C  naproxen  (NAPROSYN ) 500 MG tablet Take 1 tablet (500 mg total) by mouth 2 (two) times daily with a meal. 01/24/24   Margrette Taft BRAVO, MD  Naproxen  Sodium (ALEVE  PO) Take 1 tablet by mouth as needed (back pain).     [provider]  tiZANidine  (ZANAFLEX ) 4 MG tablet TAKE ONE TABLET BY MOUTH EVERY NIGHT BEFORE BED AS NEEDED FOR SPASMS 11/28/23   Brenna Lin, MD  valACYclovir  (VALTREX ) 1000 MG tablet Take 1 tablet (1,000 mg total) by mouth 2 (two) times daily. 02/01/23   Signa Delon LABOR, NP  venlafaxine  XR (EFFEXOR -XR) 37.5 MG 24 hr capsule Take 1 capsule (37.5 mg total) by mouth daily. 02/06/24   Bevely Doffing, FNP  Allergies: Codeine, Molds & smuts, and Zocor [simvastatin]    Review of Systems  Musculoskeletal:  Positive for arthralgias and myalgias.  All other systems reviewed and are negative.   Updated Vital Signs BP 138/71   Pulse 63   Temp 98 F (36.7 C) (Oral)   Resp (!) 28   Ht 5' 3 (1.6 m)   Wt 58.1 kg   SpO2 96%   BMI 22.69 kg/m   Physical Exam Vitals and nursing note reviewed.  Constitutional:      General: She is not in acute distress.    Appearance: Normal appearance. She is well-developed. She is not ill-appearing, toxic-appearing or diaphoretic.  HENT:     Head: Normocephalic and atraumatic.     Right Ear: External ear normal.     Left Ear: External ear normal.     Nose: Nose normal.  Eyes:      Extraocular Movements: Extraocular movements intact.     Conjunctiva/sclera: Conjunctivae normal.  Cardiovascular:     Rate and Rhythm: Normal rate and regular rhythm.  Pulmonary:     Effort: Pulmonary effort is normal. No respiratory distress.  Abdominal:     General: There is no distension.     Palpations: Abdomen is soft.  Musculoskeletal:        General: No swelling or deformity.     Cervical back: Normal range of motion and neck supple.  Skin:    General: Skin is warm and dry.     Coloration: Skin is not jaundiced or pale.  Neurological:     General: No focal deficit present.     Mental Status: She is alert and oriented to person, place, and time.  Psychiatric:        Mood and Affect: Mood normal.        Behavior: Behavior normal.     (all labs ordered are listed, but only abnormal results are displayed) Labs Reviewed  BASIC METABOLIC PANEL WITH GFR - Abnormal; Notable for the following components:      Result Value   CO2 17 (*)    Glucose, Bld 206 (*)    All other components within normal limits  CBC WITH DIFFERENTIAL/PLATELET    EKG: None  Radiology: CT PELVIS WO CONTRAST Result Date: 04/01/2024 CLINICAL DATA:  Fall, left hip pain. Suspicion for left pubic ramus fractures on conventional radiography. EXAM: CT PELVIS WITHOUT CONTRAST TECHNIQUE: Multidetector CT imaging of the pelvis was performed following the standard protocol without intravenous contrast. RADIATION DOSE REDUCTION: This exam was performed according to the departmental dose-optimization program which includes automated exposure control, adjustment of the mA and/or kV according to patient size and/or use of iterative reconstruction technique. COMPARISON:  Radiographs 04/08/2024 FINDINGS: OSSEOUS STRUCTURES AND JOINTS: Acute fracture of the left superior pubic ramus Acute nondisplaced fracture of the left inferior pubic ramus on image 108 series 3. There is typically an accompanying fracture of the sacrum  in this fracture pattern, although if present today the fracture is occult on CT. No hip effusion or fracture of the left proximal femur is observed. Mild spurring and sclerosis of the pubis. Moderate right foraminal impingement at L5-S1 due to intervertebral and facet spurring. With a small intermediary fragment as shown on image 48 series 6. MUSCULOTENDINOUS: Mild edema along fascia planes adjacent to the left pubic ramus fractures. SACRAL PLEXUS: No impinging lesion is identified involving the sacral plexus or proximal sciatic nerves. OTHER: Atherosclerosis is present, including aortoiliac atherosclerotic disease. Sigmoid colon diverticulosis. Uterus  absent. Urinary bladder unremarkable. IMPRESSION: 1. Acute fractures of the left superior and inferior pubic rami. There is typically an accompanying fracture of the sacrum in this fracture pattern, although no sacral fracture is currently visible. 2. Moderate right foraminal impingement at L5-S1 due to intervertebral and facet spurring. 3. Sigmoid colon diverticulosis. 4.  Aortic Atherosclerosis (ICD10-I70.0). Electronically Signed   By: Alexzavier Girardin Salvage M.D.   On: 04/01/2024 14:24   DG Hip Unilat With Pelvis 2-3 Views Left Result Date: 04/01/2024 CLINICAL DATA:  Fall, hip and leg pain EXAM: DG HIP (WITH OR WITHOUT PELVIS) 2-3V LEFT COMPARISON:  03/22/2024 FINDINGS: Subtle deformity left superior pubic ramus suspicious for fracture. Possible left inferior pubic ramus fracture. No obvious discontinuity of the arcuate lines. No sacral or pubic diastasis. No definite fracture of the left proximal femur observed. IMPRESSION: 1. Subtle deformity of the left pubic rami suspicious for left pelvic fracture. Consider CT of the bony pelvis without contrast for further characterization. Electronically Signed   By: Almyra Birman Salvage M.D.   On: 04/01/2024 13:19   DG Knee Left Port Result Date: 04/01/2024 CLINICAL DATA:  Fall, lower extremity pain EXAM: PORTABLE LEFT KNEE  - 1-2 VIEW COMPARISON:  Radiographs 07/27/2022 FINDINGS: Medial compartmental articular space narrowing. Mild marginal spurring in the patella along with spurring at the distal quadriceps attachment to the patella. No knee effusion.  No fracture or acute bony findings identified. IMPRESSION: 1. Mild osteoarthritis. No acute findings. Electronically Signed   By: Tsugio Elison Salvage M.D.   On: 04/01/2024 13:17   DG Chest Port 1 View Result Date: 04/01/2024 CLINICAL DATA:  Fall.  Hip and leg pain. EXAM: PORTABLE CHEST 1 VIEW COMPARISON:  12/05/2022 FINDINGS: Heart size within normal limits for apical lordotic AP projection. The lungs appear clear. No blunting of the costophrenic angles. Mild lower thoracic spondylosis. IMPRESSION: 1. No active cardiopulmonary disease is radiographically apparent. 2. Mild lower thoracic spondylosis. Electronically Signed   By: Derrich Gaby Salvage M.D.   On: 04/01/2024 13:16     Procedures   Medications Ordered in the ED  HYDROmorphone  (DILAUDID ) injection 0.5 mg (0.5 mg Intravenous Given 04/01/24 1202)  doxycycline  (VIBRA -TABS) tablet 100 mg (has no administration in time range)  acetaminophen  (TYLENOL ) tablet 500 mg (has no administration in time range)  LORazepam  (ATIVAN ) tablet 0.5 mg (has no administration in time range)  naproxen  (NAPROSYN ) tablet 500 mg (has no administration in time range)  venlafaxine  XR (EFFEXOR -XR) 24 hr capsule 37.5 mg (has no administration in time range)  levothyroxine  (SYNTHROID ) tablet 75 mcg (has no administration in time range)  HYDROcodone -acetaminophen  (NORCO/VICODIN) 5-325 MG per tablet 1 tablet (has no administration in time range)  oxyCODONE -acetaminophen  (PERCOCET/ROXICET) 5-325 MG per tablet 1 tablet (1 tablet Oral Given 04/01/24 1203)                                    Medical Decision Making Amount and/or Complexity of Data Reviewed Labs: ordered. Radiology: ordered.  Risk Prescription drug management.   This patient  presents to the ED for concern of left hip pain, this involves an extensive number of treatment options, and is a complaint that carries with it a high risk of complications and morbidity.  The differential diagnosis includes fracture, dislocation, connective tissue injury, neoplasm   Co morbidities / Chronic conditions that complicate the patient evaluation  HLD, DM, depression, HTN, anxiety   Additional history obtained:  Additional  history obtained from EMR External records from outside source obtained and reviewed including N/A   Lab Tests:  I Ordered, and personally interpreted labs.  The pertinent results include: normal hemoglobin, no leukocytosis, mild hyperglycemia without evidence of DKA, normal electrolytes   Imaging Studies ordered:  I ordered imaging studies including X-ray of chest, left knee, left pelvis; CT of pelvis  I independently visualized and interpreted imaging which showed acute fractures of left superior and inferior pubic rami I agree with the radiologist interpretation   Cardiac Monitoring: / EKG:  The patient was maintained on a cardiac monitor.  I personally viewed and interpreted the cardiac monitored which showed an underlying rhythm of: Sinus rhythm   Problem List / ED Course / Critical interventions / Medication management  Patient presents for left hip pain.  She had a fall 10 days ago and has had some mild discomfort since that time.  She was seen in the ED and underwent laceration repair.  Today, she was leaning forward and had acute onset of severe pain in area of left hip that radiates down anterior proximal left leg.  On arrival in the ED, no deformities are noted.  Range of motion is limited by pain.  Percocet and Dilaudid  were ordered for analgesia.  Workup was initiated.  On x-ray of left hip, there was a subtle deformity to left superior pubic ramus.  CT imaging was ordered to further evaluate.  On CT imaging, patient is found to have  inferior and superior pubic rami fractures on left side.  I spoke with orthopedic surgeon, Dr. Sharl who recommends weightbearing as tolerated.  This would be a nonoperative injury.  Patient was informed of her injury.  Currently, she lives alone.  Plan will be for PT evaluation in the ED.  In terms of her right lower extremity wound, there is some erythema and warmth.  Will cover with doxycycline .  Home medications were ordered. I ordered medication including Percocet and Dilaudid  for analgesia; continued home medications   Reevaluation of the patient after these medicines showed that the patient improved I have reviewed the patients home medicines and have made adjustments as needed  Social Determinants of Health:  Lives independently      Final diagnoses:  Closed fracture of multiple pubic rami, left, initial encounter [facility])    ED Discharge Orders     None          Melvenia Motto, MD 04/01/24 1552

## 2024-04-01 NOTE — ED Triage Notes (Signed)
 Pt arrived via [facility]. Pt was at home and sitting on couch. Pt bent over to pick up phone off the coffee table and felt an audible pop in her left hip. Upon inspection hip appears in-line. Upon palpation pt has 10/10 pain in hip and leg. Pt's leg runs down to her upper thigh. No other pain noted. Distal pulses noted.

## 2024-04-02 ENCOUNTER — Ambulatory Visit

## 2024-04-02 LAB — CBG MONITORING, ED
Glucose-Capillary: 114 mg/dL — ABNORMAL HIGH (ref 70–99)
Glucose-Capillary: 111 mg/dL — ABNORMAL HIGH (ref 70–99)
Glucose-Capillary: 163 mg/dL — ABNORMAL HIGH (ref 70–99)
Glucose-Capillary: 119 mg/dL — ABNORMAL HIGH (ref 70–99)

## 2024-04-02 NOTE — ED Notes (Signed)
 Transition of Care [facility]) - Emergency Department Mini Assessment   Patient Details  Name: CALEDONIA ZOU MRN: [id number] Date of Birth: 1940/11/17  Transition of Care [facility]) CM/SW Contact:    Noreen KATHEE Cleotilde ISRAEL Phone Number: 04/02/2024, 10:34 AM   Clinical Narrative:  CSW spoke with patient at bedside about PT recommendation for SNF. Patient is agreeable to SNF and chose [facility] , [facility], and [facility] as her  facility choices. Patient has a walk in shower, shower bench, pull bar in shower, and 3N1. Patient lives alone and shared that she is independent at home. Patient reports that her son lives close by but works out of town everyday, daughter in Social worker , is in school and works in [place], and her Higinio goes to grocery store for her. Patient reports that she just started driving Sunday and her fall occurred at yard sale at a friend house. Patient reports that she has never been to a facility before nor had HH, just a nurse visit once a year through her insurance. CSW will continue to follow.    ED Mini Assessment: What brought you to the Emergency Department? : Had a Fall on the 13th  Barriers to Discharge: Other (must enter comment) (SNF placement)  Barrier interventions: Find placement close by  Means of departure: Not know  Interventions which prevented an admission or readmission: SNF Placement    Patient Contact and Communications Key Contact 1: Cy Sermon with: Patient at bedside Contact Date: 04/02/24,   Contact time: 1033 Contact Phone Number: At bedside Call outcome: SNF placement  Patient states their goals for this hospitalization and ongoing recovery are:: Go to SNF close to her home CMS Medicare.gov Compare Post Acute Care list provided to:: Patient Choice offered to / list presented to : Patient  Admission diagnosis:  L Hip Pain Patient Active Problem List   Diagnosis Date Noted   Laceration of right lower leg 03/30/2024   Arthritis of right hip 03/30/2024    Vasomotor symptoms due to menopause 08/27/2023   Mass of right submandibular region 08/27/2023   Chest pressure 03/14/2023   Family history of coronary artery disease in daughter 03/14/2023   Prediabetes 02/22/2023   MDD (major depressive disorder) 02/22/2023   Insomnia 02/22/2023   Hyperlipidemia 02/22/2023   Chronic musculoskeletal pain 02/22/2023   Intermittent chest pain 02/22/2023   Herpes 02/01/2023   Vesicles 02/01/2023   Dysuria 01/13/2022   Hematuria 01/13/2022   Urinary frequency 01/13/2022   S/P hysterectomy 01/13/2022   Boil, vulva 01/30/2020   RLQ abdominal pain 10/23/2019   Yeast vaginitis 10/23/2019   Vaginal irritation 10/23/2019   Bladder pain 10/23/2019   Type 2 diabetes mellitus with diabetic neuropathy (HCC) 03/13/2019   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   Right knee pain 08/18/2015   Right shoulder pain 08/18/2015   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   Screening for STD (sexually transmitted disease) 07/23/2015   Bartholin cyst 05/20/2015   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 AT [address] [place] [phone]  [address] [place] [address] Phone: [phone] Fax: [phone]

## 2024-04-02 NOTE — ED Notes (Signed)
 Pt care taken, no complaints at this time. Waiting for rehab placement.

## 2024-04-02 NOTE — ED Provider Notes (Signed)
 Emergency Medicine Observation Re-evaluation Note  Anne Davenport is a 83 y.o. female, seen on rounds today.  Pt initially presented to the ED for complaints of Hip Pain Currently, the patient is sleeping.  Physical Exam  BP (!) 153/75   Pulse 66   Temp 98.9 F (37.2 C) (Oral)   Resp 16   Ht 5' 3 (1.6 m)   Wt 58.1 kg   SpO2 91%   BMI 22.69 kg/m  Physical Exam General: No acute distress Cardiac: Well-perfused Lungs: Nonlabored Psych: Unable to assess  ED Course / MDM  EKG:   I have reviewed the labs performed to date as well as medications administered while in observation.  Recent changes in the last 24 hours include evaluation.  Plan  Current plan is for physical therapy consult and social work consult for pubic rami fracture nonoperative.    Towana Ozell BROCKS, MD 04/02/24 [phone]

## 2024-04-02 NOTE — ED Provider Notes (Signed)
 83 year old female coming from home after a fall.  She was initially seen 9/23 and had a CT scan showing inferior and superior pelvic rami fractures on the left.  Orthopedics Dr. Sharl was consulted and recommended no operative intervention, weightbearing as tolerated.  She was seen in the emergency department by physical therapy who are recommending ongoing treatment and strength training with rolling walker.  Increased help with ADLs.  Social work was consulted and working on Scientific laboratory technician.  Patient is otherwise awake and alert in no significant pain.  Her lab work showed an elevated blood sugar.  She has been on a sliding scale here and her home medications.  Hydrocodone  as needed for pain.   Towana Ozell BROCKS, MD 04/02/24 [phone]

## 2024-04-02 NOTE — ED Notes (Signed)
 Pt in bed, pt reports decreased headache, pt in bed, resps even and unlabored, pt has no requests at this time, pt awaits PT

## 2024-04-02 NOTE — NC FL2 (Signed)
 [place]  MEDICAID [id number] LEVEL OF CARE FORM     IDENTIFICATION  Patient Name: Anne Davenport Birthdate: 1941/05/23 Sex: female Admission Date (Current Location): 04/01/2024  [facility] and [place] Number:  [employer] and Address:  [facility],  [address] [place] [address]      Provider Number: [id number]  Attending Physician Name and Address:  Towana Ozell BROCKS, MD  Relative Name and Phone Number:       Current Level of Care: Hospital Recommended Level of Care: Skilled Nursing Facility Prior Approval Number:    Date Approved/Denied:   PASRR Number:    Discharge Plan: SNF    Current Diagnoses: Patient Active Problem List   Diagnosis Date Noted   Laceration of right lower leg 03/30/2024   Arthritis of right hip 03/30/2024   Vasomotor symptoms due to menopause 08/27/2023   Mass of right submandibular region 08/27/2023   Chest pressure 03/14/2023   Family history of coronary artery disease in daughter 03/14/2023   Prediabetes 02/22/2023   MDD (major depressive disorder) 02/22/2023   Insomnia 02/22/2023   Hyperlipidemia 02/22/2023   Chronic musculoskeletal pain 02/22/2023   Intermittent chest pain 02/22/2023   Herpes 02/01/2023   Vesicles 02/01/2023   Dysuria 01/13/2022   Hematuria 01/13/2022   Urinary frequency 01/13/2022   S/P hysterectomy 01/13/2022   Boil, vulva 01/30/2020   RLQ abdominal pain 10/23/2019   Yeast vaginitis 10/23/2019   Vaginal irritation 10/23/2019   Bladder pain 10/23/2019   Type 2 diabetes mellitus with diabetic neuropathy (HCC) 03/13/2019   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   Right knee pain 08/18/2015   Right shoulder pain 08/18/2015   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   Screening for STD (sexually transmitted disease) 07/23/2015   Bartholin cyst 05/20/2015   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, 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   Iatrogenic hypothyroidism 04/09/2015   High cholesterol  09/07/2014    Orientation RESPIRATION BLADDER Height & Weight     Self, Time, Situation, Place  Normal Continent Weight: 128 lb 1.4 oz (58.1 kg) Height:  5' 3 (160 cm)  BEHAVIORAL SYMPTOMS/MOOD NEUROLOGICAL BOWEL NUTRITION STATUS      Continent Diet (regular)  AMBULATORY STATUS COMMUNICATION OF NEEDS Skin   Extensive Assist Verbally Bruising, Skin abrasions, Other (Comment) (18 stitches in lower right leg)                       Personal Care Assistance Level of Assistance  Bathing, Feeding, Dressing Bathing Assistance: Limited assistance Feeding assistance: Independent Dressing Assistance: Limited assistance     Functional Limitations Info  Sight, Hearing, Speech Sight Info: Impaired (glasses or contacts) Hearing Info: Impaired (has hearing aids when she needs them- don't use on a daily)      SPECIAL CARE FACTORS FREQUENCY  PT (By licensed PT)     PT Frequency: 5 x a week              Contractures Contractures Info: Not present    Additional Factors Info  Code Status, Allergies Code Status Info: FULL Allergies Info: Codeine, Zocor, and Molds and Smuts           Current Medications (04/02/2024):  This is the current hospital active medication list Current Facility-Administered Medications  Medication Dose Route Frequency Provider Last Rate Last Admin   acetaminophen  (TYLENOL ) tablet 500 mg  500 mg Oral Q6H PRN Melvenia Motto, MD       doxycycline  (VIBRA -TABS) tablet 100 mg  100 mg Oral Q12H Melvenia Motto, MD   100 mg at 04/02/24 0932   HYDROcodone -acetaminophen  (NORCO/VICODIN) 5-325 MG per tablet 1 tablet  1 tablet Oral Q6H PRN Melvenia Motto, MD   1 tablet at 04/01/24 1856   insulin  aspart (novoLOG ) injection 0-5 Units  0-5 Units Subcutaneous QHS Melvenia Motto, MD       insulin  aspart (novoLOG ) injection 0-9 Units  0-9 Units Subcutaneous TID WC Melvenia Motto, MD       levothyroxine  (SYNTHROID ) tablet 75 mcg  75 mcg Oral Q0600 Melvenia Motto, MD   75 mcg at 04/02/24 9261    LORazepam  (ATIVAN ) tablet 0.5 mg  0.5 mg Oral QHS Melvenia Motto, MD   0.5 mg at 04/01/24 2205   naproxen  (NAPROSYN ) tablet 500 mg  500 mg Oral BID WC Melvenia Motto, MD   500 mg at 04/02/24 9261   venlafaxine  XR (EFFEXOR -XR) 24 hr capsule 37.5 mg  37.5 mg Oral Daily Melvenia Motto, MD   37.5 mg at 04/02/24 9066   Current Outpatient Medications  Medication Sig Dispense Refill   acetaminophen  (TYLENOL ) 500 MG tablet Take 1,000 mg by mouth every 6 (six) hours as needed for mild pain (pain score 1-3), moderate pain (pain score 4-6) or headache.     calcium carbonate (OSCAL) 1500 (600 Ca) MG TABS tablet Take 600 mg of elemental calcium by mouth daily with breakfast.     cetirizine (ZYRTEC) 10 MG tablet Take 5 mg by mouth daily as needed for allergies.     Cholecalciferol (VITAMIN D3) 50 MCG (2000 UT) capsule Take 4,000 Units by mouth daily.     estradiol  (DOTTI ) 0.075 MG/24HR Place 1 patch onto the skin 2 (two) times a week. (Patient taking differently: Place 1 patch onto the skin 2 (two) times a week. Tuesday and Friday) 8 patch 12   fluticasone (FLONASE) 50 MCG/ACT nasal spray Place 2 sprays into the nose daily as needed for rhinitis or allergies.     HYDROcodone -acetaminophen  (NORCO/VICODIN) 5-325 MG tablet TAKE ONE TABLET BY MOUTH EVERY 4 HOURS AS NEEDED FOR UP TO 5 DAYS FOR MODERATE PAIN (PAIN SCORE 4-6) (Patient taking differently: Take 0.5 tablets by mouth every 4 (four) hours as needed for moderate pain (pain score 4-6). TAKE ONE TABLET BY MOUTH EVERY 4 HOURS AS NEEDED FOR UP TO 5 DAYS FOR MODERATE PAIN (PAIN SCORE 4-6)) 30 tablet 0   levothyroxine  (SYNTHROID ) 75 MCG tablet TAKE ONE (1) TABLET BY MOUTH EVERY DAY IN THE MORNING ON AN EMPTY STOMACH (Patient taking differently: Take 75 mcg by mouth daily before breakfast.) 90 tablet 0   lidocaine  (LIDODERM ) 5 % Place 1 patch onto the skin daily. Remove & Discard patch within 12 hours or as directed by MD 30 patch 3   lovastatin (MEVACOR) 40 MG tablet TAKE  ONE (1) TABLET BY MOUTH EVERY DAY (Patient taking differently: Take 40 mg by mouth at bedtime.) 90 tablet 0   Naproxen  Sodium (ALEVE  PO) Take 220 mg by mouth as needed (back pain).     venlafaxine  XR (EFFEXOR -XR) 37.5 MG 24 hr capsule Take 1 capsule (37.5 mg total) by mouth daily. 90 capsule 1     Discharge Medications: Please see discharge summary for a list of discharge medications.  Relevant Imaging Results:  Relevant Lab Results:   Additional Information SSN: [id number]  Noreen KATHEE [place], [place]

## 2024-04-02 NOTE — ED Notes (Signed)
 Pt in chair with eyes closed, resps even and unlabored.

## 2024-04-02 NOTE — Discharge Instructions (Signed)
 83 year old female coming from home after a fall.  She was initially seen 9/23 and had a CT scan showing inferior and superior pelvic rami fractures on the left.  Orthopedics Dr. Sharl was consulted and recommended no operative intervention, weightbearing as tolerated.  She was seen in the emergency department by physical therapy who are recommending ongoing treatment and strength training with rolling walker.  Increased help with ADLs.  Social work was consulted and working on Scientific laboratory technician.   Patient is otherwise awake and alert in no significant pain.  Her lab work showed an elevated blood sugar.  She has been on a sliding scale here and her home medications.  Hydrocodone  as needed for pain.

## 2024-04-02 NOTE — Plan of Care (Signed)
  Problem: Acute Rehab PT Goals(only PT should resolve) Goal: Pt Will Go Supine/Side To Sit Outcome: Progressing Flowsheets (Taken 04/02/2024 1204) Pt will go Supine/Side to Sit:  with contact guard assist  with minimal assist Goal: Patient Will Transfer Sit To/From Stand Outcome: Progressing Flowsheets (Taken 04/02/2024 1204) Patient will transfer sit to/from stand:  with contact guard assist  with minimal assist Goal: Pt Will Transfer Bed To Chair/Chair To Bed Outcome: Progressing Flowsheets (Taken 04/02/2024 1204) Pt will Transfer Bed to Chair/Chair to Bed:  with contact guard assist  with min assist Goal: Pt Will Ambulate Outcome: Progressing Flowsheets (Taken 04/02/2024 1204) Pt will Ambulate:  50 feet  with contact guard assist  with minimal assist  with rolling walker   12:04 PM, 04/02/24 Lynwood Music, MPT Physical Therapist with [facility] [phone] office [phone] mobile phone

## 2024-04-02 NOTE — Evaluation (Signed)
 Physical Therapy Evaluation Patient Details Name: Anne Davenport MRN: [id number] DOB: 11-27-1940 Today's Date: 04/02/2024  History of Present Illness  Anne Davenport is a 83 y.o. female.           Hip Pain    Patient presents for left hip pain.  She had a fall a week ago and has since had some mild pain in her left hip.  Earlier today, she was bending over a coffee table to reach for her phone.  When she did so, she felt a pop in her left hip and had onset of severe pain.  Pain radiates down anterior aspect of her left leg to her knee.  She has been unable to walk since this time.  She was able to stand with EMS.  She denies any other areas of discomfort.   Clinical Impression  Patient demonstrates slow labored movement for sitting up at bedside, unsteady on feet and with increased left hip pain when weightbearing, able to ambulate into hallway with slow labored movement and limited mostly due to c/o fatigue and increasing left hip pain. Patient tolerated sitting up in chair after therapy - nursing staff aware. Patient will benefit from continued skilled physical therapy in hospital and recommended venue below to increase strength, balance, endurance for safe ADLs and gait.           If plan is discharge home, recommend the following: A little help with walking and/or transfers;A little help with bathing/dressing/bathroom;Assistance with cooking/housework;Assist for transportation;Help with stairs or ramp for entrance   Can travel by private vehicle   Yes    Equipment Recommendations Rolling walker (2 wheels)  Recommendations for Other Services       Functional Status Assessment Patient has had a recent decline in their functional status and demonstrates the ability to make significant improvements in function in a reasonable and predictable amount of time.     Precautions / Restrictions Precautions Precautions: Fall Restrictions Weight Bearing Restrictions Per Provider Order: Yes LLE  Weight Bearing Per Provider Order: Weight bearing as tolerated      Mobility  Bed Mobility Overal bed mobility: Needs Assistance Bed Mobility: Supine to Sit     Supine to sit: Min assist, Mod assist     General bed mobility comments: increased time, labored movement    Transfers Overall transfer level: Needs assistance Equipment used: Rolling walker (2 wheels) Transfers: Sit to/from Stand, Bed to chair/wheelchair/BSC Sit to Stand: Min assist   Step pivot transfers: Min assist, Mod assist       General transfer comment: slow labored movement with limited weightearing on LLE    Ambulation/Gait Ambulation/Gait assistance: Min assist, Mod assist Gait Distance (Feet): 30 Feet Assistive device: Rolling walker (2 wheels) Gait Pattern/deviations: Step-to pattern, Decreased stance time - left, Decreased step length - left, Decreased step length - right, Decreased stride length, Antalgic Gait velocity: decreased     General Gait Details: slow labored movemetn with antalgic gait on LLE due to increased pain, limited mostly due to fatigue  Stairs            Wheelchair Mobility     Tilt Bed    Modified Rankin (Stroke Patients Only)       Balance Overall balance assessment: Needs assistance Sitting-balance support: Feet supported, No upper extremity supported Sitting balance-Leahy Scale: Fair Sitting balance - Comments: seated at EOB   Standing balance support: Reliant on assistive device for balance, During functional activity, Bilateral upper extremity supported Standing  balance-Leahy Scale: Fair Standing balance comment: using RW                             Pertinent Vitals/Pain Pain Assessment Pain Assessment: 0-10 Pain Score: 6  Pain Location: left hip, right lower leg at site of wound, lefth thigh Pain Descriptors / Indicators: Grimacing, Discomfort, Sharp Pain Intervention(s): Limited activity within patient's tolerance, Monitored during  session, Premedicated before session, Repositioned    Home Living Family/patient expects to be discharged to:: Private residence Living Arrangements: Alone Available Help at Discharge: Family;Available PRN/intermittently Type of Home: House Home Access: Stairs to enter Entrance Stairs-Rails: None Entrance Stairs-Number of Steps: 1   Home Layout: One level Home Equipment: Shower seat;Toilet riser;Cane - single point      Prior Function Prior Level of Function : Independent/Modified Independent;Driving             Mobility Comments: Community ambulation without AD, since fall with laceration to RLE has been using SPC, drives ADLs Comments: Independent     Extremity/Trunk Assessment   Upper Extremity Assessment Upper Extremity Assessment: Generalized weakness    Lower Extremity Assessment Lower Extremity Assessment: LLE deficits/detail LLE Deficits / Details: grossly -4/5 LLE: Unable to fully assess due to pain LLE Sensation: WNL LLE Coordination: WNL    Cervical / Trunk Assessment Cervical / Trunk Assessment: Normal  Communication   Communication Communication: No apparent difficulties    Cognition Arousal: Alert Behavior During Therapy: WFL for tasks assessed/performed   PT - Cognitive impairments: No apparent impairments                         Following commands: Intact       Cueing Cueing Techniques: Verbal cues, Tactile cues     General Comments      Exercises     Assessment/Plan    PT Assessment Patient needs continued PT services  PT Problem List Decreased strength;Decreased activity tolerance;Decreased balance;Decreased mobility;Pain       PT Treatment Interventions DME instruction;Gait training;Stair training;Functional mobility training;Therapeutic activities;Therapeutic exercise;Balance training;Patient/family education    PT Goals (Current goals can be found in the Care Plan section)  Acute Rehab PT Goals Patient Stated  Goal: return home with family to assist PT Goal Formulation: With patient Time For Goal Achievement: 04/16/24 Potential to Achieve Goals: Good    Frequency Min 2X/week     Co-evaluation               AM-PAC PT 6 Clicks Mobility  Outcome Measure Help needed turning from your back to your side while in a flat bed without using bedrails?: A Little Help needed moving from lying on your back to sitting on the side of a flat bed without using bedrails?: A Lot Help needed moving to and from a bed to a chair (including a wheelchair)?: A Little Help needed standing up from a chair using your arms (e.g., wheelchair or bedside chair)?: A Little Help needed to walk in hospital room?: A Little Help needed climbing 3-5 steps with a railing? : A Lot 6 Click Score: 16    End of Session   Activity Tolerance: Patient tolerated treatment well;Patient limited by fatigue;Patient limited by pain Patient left: in chair;with call bell/phone within reach;with family/visitor present Nurse Communication: Mobility status PT Visit Diagnosis: Unsteadiness on feet (R26.81);Other abnormalities of gait and mobility (R26.89);Muscle weakness (generalized) (M62.81)    Time: 9044-8974 PT Time  Calculation (min) (ACUTE ONLY): 30 min   Charges:   PT Evaluation $PT Eval Moderate Complexity: 1 Mod PT Treatments $Therapeutic Activity: 23-37 mins PT General Charges $$ ACUTE PT VISIT: 1 Visit         12:02 PM, 04/02/24 Lynwood Music, MPT Physical Therapist with [facility] [phone] office [phone] mobile phone

## 2024-04-03 LAB — CBG MONITORING, ED
Glucose-Capillary: 120 mg/dL — ABNORMAL HIGH (ref 70–99)
Glucose-Capillary: 146 mg/dL — ABNORMAL HIGH (ref 70–99)

## 2024-04-03 MED ORDER — DOXYCYCLINE HYCLATE 100 MG PO CAPS: 100.0000 mg | ORAL_CAPSULE | Freq: Two times a day (BID) | ORAL | 0 refills | Status: AC

## 2024-04-03 MED ORDER — HYDROCODONE-ACETAMINOPHEN 5-325 MG PO TABS: 2.0000 | ORAL_TABLET | ORAL | 0 refills | Status: AC | PRN

## 2024-04-03 NOTE — ED Notes (Signed)
 CSW received a secure message from nurse that patient changed her mind about Rehab and wants to go home with [facility] since her insurance is able to provide 8 hours of home care services. Patient agreeable to HHPT. No HH agency preference . Cory with [facility] was able to accept referral for HHPT. Patient agreeable- will update MD and nurse regarding DC readiness. CSW signing off.

## 2024-04-03 NOTE — ED Notes (Signed)
 EDP in room w/ pt removing stitches from right leg.

## 2024-04-03 NOTE — ED Notes (Signed)
 Spoke w/ family member Vertell. Stated she will pick pt up at 2:30PM and will have everything at the house set up and ready for pt to return.

## 2024-04-03 NOTE — ED Notes (Signed)
 Social work aware of pt request.

## 2024-04-03 NOTE — ED Notes (Signed)
 Patient stated that she spoke with her insurance provider and they stated that they could provide her with at home care, with a person up to 8 hours a day. Patient stated she wanted to go home and that she wanted this type of rehab.

## 2024-04-03 NOTE — ED Provider Notes (Signed)
  Physical Exam  BP 135/67   Pulse 64   Temp 97.6 F (36.4 C)   Resp 18   Ht 5' 3 (1.6 m)   Wt 58.1 kg   SpO2 95%   BMI 22.69 kg/m   Physical Exam  Procedures  Suture Removal  Date/Time: 04/03/2024 1:22 PM  Performed by: Patsey Lot, MD Authorized by: Patsey Lot, MD   Consent:    Consent obtained:  Verbal   Consent given by:  Patient   Risks discussed:  Bleeding, wound separation and pain   Alternatives discussed:  No treatment and delayed treatment Universal protocol:    Patient identity confirmed:  Verbally with patient Location:    Location:  Lower extremity   Lower extremity location:  Leg   Leg location:  R lower leg Procedure details:    Wound appearance:  No signs of infection and nonpurulent   Number of sutures removed:  18 Post-procedure details:    Post-removal:  No dressing applied   Procedure completion:  Tolerated well, no immediate complications   ED Course / MDM    Medical Decision Making Amount and/or Complexity of Data Reviewed Labs: ordered. Radiology: ordered.  Risk OTC drugs. Prescription drug management.   Patient would rather now go home for home health as opposed to nursing home placement.  Has been set up.  Sutures removed from healing wound on right lower leg.       Patsey Lot, MD 04/03/24 1323

## 2024-04-07 ENCOUNTER — Ambulatory Visit: Payer: Self-pay

## 2024-04-07 ENCOUNTER — Other Ambulatory Visit: Payer: Self-pay

## 2024-04-07 MED ORDER — SILVER SULFADIAZINE 1 % EX CREA: 1.0000 | TOPICAL_CREAM | Freq: Every day | CUTANEOUS | 0 refills | Status: AC

## 2024-04-07 NOTE — Telephone Encounter (Signed)
 Appt made.

## 2024-04-07 NOTE — Telephone Encounter (Signed)
 Pt states that she has already been scheduled with PCP tomorrow morning. Pt states she is concerned about what topical to put on her leg where the scar is healing. Pt states that the ED did not tell her what to apply. Pt would like to know what Leita would like me to use, please call pt back and advise.   Message from Alfonso HERO sent at 04/07/2024  9:59 AM EDT  Patient has a healing scar that stinging and scabbing wants to know if there is something she can dress it with to relieve the irritation.

## 2024-04-07 NOTE — ED Notes (Signed)
 TOC updated that family is calling inquiring about [facility] services that were arranged. CSW reached out to [facility] with [place] and asked that they follow up. [facility]'s office number provided to family to reach out and follow up as well.

## 2024-04-07 NOTE — Telephone Encounter (Signed)
 FYI Only or Action Required?: FYI only for provider.  Patient was last seen in primary care on 03/26/2024 by Bevely Doffing, FNP.  Called Nurse Triage reporting No chief complaint on file..  Symptoms began about a month ago.  Interventions attempted: Prescription medications: doxycycline  (VIBRAMYCIN ) 100 MG capsule.   Triage Disposition: No disposition on file.                 Patient/caregiver understands and will follow disposition?:  Reason for Disposition  Sounds like a recheck is needed to the triager  Answer Assessment - Initial Assessment Questions This RN scheduled pt for an appt tomorrow with PCP in office. This RN educated pt on new-worsening symptoms and when to call back/seek emergent care. Pt verbalized understanding and agrees to plan.    Pt states she fell and saw Doffing, [place], after on 9/17 Pt went to ED last week when something popped in her hip; pt has two fractures in her pelvis Pt states she is pretty much confined to a bed and wheelchair   R ankle wound from fall The wound is drying up- it got infected and she is taking an antibiotic; pt states the antibiotic is helping It feels tight and stings when moving leg Pt is wondering if there is something PCP recommends to put on wound to help with tightness  Denies fever, drainage from wound  Black scab; denies expanding in size  Protocols used: Falls and Falling-A-AH, Wound Infection on Antibiotic Follow-up Call-A-[facility]

## 2024-04-08 ENCOUNTER — Ambulatory Visit (INDEPENDENT_AMBULATORY_CARE_PROVIDER_SITE_OTHER)

## 2024-04-08 VITALS — BP 130/77 | HR 68 | Ht 63.0 in | Wt 136.1 lb

## 2024-04-08 DIAGNOSIS — E1140 Type 2 diabetes mellitus with diabetic neuropathy, unspecified: Secondary | ICD-10-CM

## 2024-04-08 DIAGNOSIS — S81811D Laceration without foreign body, right lower leg, subsequent encounter: Secondary | ICD-10-CM | POA: Diagnosis not present

## 2024-04-08 NOTE — Telephone Encounter (Signed)
 Patient seen in office

## 2024-04-08 NOTE — Progress Notes (Signed)
 Established Patient Office Visit  Subjective   Patient ID: Anne Davenport, female    DOB: Jun 07, 1941  Age: 83 y.o. MRN: [id number]  Chief Complaint  Patient presents with   Medical Management of Chronic Issues    Right leg wound     HPI Discussed the use of AI scribe software for clinical note transcription with the patient, who gave verbal consent to proceed.  History of Present Illness   Anne Davenport is an 83 year old female who presents with wound care concerns and follow-up after recent pelvic fractures.  Wound healing and care - Wound is less erythematous but has started leaking slightly - Stitches were removed one day later than planned - Wound feels tight and stings, causing concern about healing - Wound care routine includes use of gauze and Vaseline, initiated after wound infection - Prefers gauze over other bandages and has adequate supplies at home  Pelvic fractures and associated symptoms - Sustained two pelvic fractures after a fall on her right side, with subsequent significant pain on the left side - On September 23rd, experienced a 'pop' in her hip while reaching for her cell phone, resulting in inability to walk - Diagnosed with two pelvic fractures in the emergency department after being transported by her daughter-in-law - Scheduled to start physical therapy tomorrow - Opted to return home instead of rehabilitation facility, with support from others for food and care  Pain and functional limitations - Experiencing tightness and stinging at the wound site - Significant pain in the left hip and pelvis following the fall and subsequent 'pop' event - Unable to walk after the 'pop' in her hip  Urinary and gastrointestinal symptoms - Hematuria occurred after the fall - Constipation lasted for approximately five days following the fall, attributed to trauma  Glycemic control - Elevated blood glucose levels during hospital stay, with one episode requiring  short-acting insulin  injection - Plans to manage blood sugar through dietary changes, including eliminating ice cream and sugary foods, and increasing intake of steamed vegetables while reducing fast food - Plans to have blood glucose rechecked in December  Imaging and hospital course - Brain scan performed after initial fall showed no damage - Pelvic imaging performed, likely to assess for kidney injury and pelvic fractures - Received pain medication during hospital stay      Patient Active Problem List   Diagnosis Date Noted   Laceration of right lower leg 03/30/2024   Arthritis of right hip 03/30/2024   Vasomotor symptoms due to menopause 08/27/2023   Mass of right submandibular region 08/27/2023   Chest pressure 03/14/2023   Family history of coronary artery disease in daughter 03/14/2023   Prediabetes 02/22/2023   MDD (major depressive disorder) 02/22/2023   Insomnia 02/22/2023   Hyperlipidemia 02/22/2023   Chronic musculoskeletal pain 02/22/2023   Intermittent chest pain 02/22/2023   Herpes 02/01/2023   Vesicles 02/01/2023   Dysuria 01/13/2022   Hematuria 01/13/2022   Urinary frequency 01/13/2022   S/P hysterectomy 01/13/2022   Boil, vulva 01/30/2020   RLQ abdominal pain 10/23/2019   Yeast vaginitis 10/23/2019   Vaginal irritation 10/23/2019   Bladder pain 10/23/2019   Type 2 diabetes mellitus with diabetic neuropathy (HCC) 03/13/2019   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   Right knee pain 08/18/2015   Right shoulder pain 08/18/2015   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   Screening for STD (sexually transmitted disease) 07/23/2015   Bartholin cyst 05/20/2015   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  Constitutional:      Appearance: Normal appearance.  HENT:     Head: Normocephalic.  Eyes:     Extraocular Movements: Extraocular movements intact.     Pupils: Pupils are equal, round, and reactive to light.  Cardiovascular:     Rate and Rhythm: Normal rate and regular rhythm.  Pulmonary:     Effort: Pulmonary effort is normal.     Breath sounds: Normal breath sounds.  Musculoskeletal:     Cervical back: Normal range of motion and neck supple.  Neurological:     Mental Status: She is alert and oriented to person, place, and time.  Psychiatric:        Mood and Affect: Mood normal.        Thought Content: Thought content normal.    No results found for any visits on 04/08/24.    The ASCVD Risk score (Arnett DK, et al., 2019) failed to calculate for the following reasons:   The 2019 ASCVD risk score is only valid for ages 20 to 38    Assessment & Plan:   Problem List Items Addressed This Visit       Endocrine   Type 2 diabetes mellitus with diabetic neuropathy (HCC) - Primary     Other   Laceration of right lower leg   Delayed healing with leakage and tightness due to skin tension. Previously infected, now less erythematous. - Apply Silvadene  cream as directed. - Use gauze and nonstick pads for dressing. - Consider wet dressing and wrapping for tightness. - Refer to wound care center if no improvement.         Return in about 3 months (around 07/08/2024) for chronic follow-up with PCP.    Anne Longs, FNP

## 2024-04-10 ENCOUNTER — Encounter: Payer: Self-pay | Admitting: Orthopedic Surgery

## 2024-04-10 ENCOUNTER — Ambulatory Visit: Admitting: Orthopedic Surgery

## 2024-04-10 VITALS — BP 130/77 | Ht 63.0 in | Wt 136.0 lb

## 2024-04-10 DIAGNOSIS — S32512A Fracture of superior rim of left pubis, initial encounter for closed fracture: Secondary | ICD-10-CM | POA: Diagnosis not present

## 2024-04-10 DIAGNOSIS — S81801A Unspecified open wound, right lower leg, initial encounter: Secondary | ICD-10-CM | POA: Diagnosis not present

## 2024-04-10 MED ORDER — SULFAMETHOXAZOLE-TRIMETHOPRIM 800-160 MG PO TABS: 1.0000 | ORAL_TABLET | Freq: Two times a day (BID) | ORAL | 0 refills | Status: AC

## 2024-04-10 NOTE — Progress Notes (Signed)
 Patient: Anne Davenport           Date of Birth: February 18, 1941           MRN: [id number] Visit Date: 04/10/2024 Requested by: Bevely Doffing, FNP [phone] [place],  [place] [address] PCP: Bevely Doffing, FNP   Chief Complaint  Patient presents with   Hip Pain    Left/ fell went to ER 04/01/24 has pelvic fractures    Encounter Diagnoses  Name Primary?   Wound of right leg, initial encounter Yes   Closed fracture of superior ramus of left pubis, initial encounter (HCC)     Plan:  Recheck x-ray in 6 weeks  Consult with wound care center for right leg laceration  Chief Complaint  Patient presents with   Hip Pain    Left/ fell went to ER 04/01/24 has pelvic fractures     Hip Pain     83 year old female sustained a laceration to her right leg on 13 September then had a fall and injured her pelvis with a left superior pubic ramus fracture on 923  The patient has been undergoing physical therapy at home weightbearing as tolerated with a walker but still has concerns about her right leg laceration  The sutures were removed.  There is some erythema around the wound and some areas of avascularity.  Body mass index is 24.09 kg/m.   Problem list, medical hx, medications and allergies reviewed   ROS   Allergies  Allergen Reactions   Codeine Itching and Nausea And Vomiting   Zocor [Simvastatin] Nausea And Vomiting and Rash   Molds & Smuts Other (See Comments)    Itchy, watery eyes     BP 130/77 Comment: 04/08/24  Ht 5' 3 (1.6 m)   Wt 136 lb (61.7 kg)   BMI 24.09 kg/m    Physical exam: Physical Exam Vitals and nursing note reviewed.  Constitutional:      Appearance: Normal appearance.  HENT:     Head: Normocephalic and atraumatic.  Eyes:     General: No scleral icterus.       Right eye: No discharge.        Left eye: No discharge.     Extraocular Movements: Extraocular movements intact.     Conjunctiva/sclera: Conjunctivae normal.     Pupils: Pupils  are equal, round, and reactive to light.  Cardiovascular:     Rate and Rhythm: Normal rate.     Pulses: Normal pulses.  Skin:    Capillary Refill: Capillary refill takes less than 2 seconds.  Neurological:     General: No focal deficit present.     Mental Status: She is alert and oriented to person, place, and time.  Psychiatric:        Mood and Affect: Mood normal.        Behavior: Behavior normal.        Thought Content: Thought content normal.        Judgment: Judgment normal.     Ortho Exam  MSK:  Right leg laceration.  This is a V-shaped laceration apex inferior.  There is an area of necrosis and a vascularity at the inferior portion.  There is some surrounding erythema.    Data reviewed:   Image(s) reviewed with personal interpretation:   04/01/2024 patient had a hip x-ray to rule out left hip fracture  There was no evidence of fracture  CT of the pelvis was also done.  There is a superior pubic  ramus fracture in 2 places.  Right ankle area of laceration  No evidence of fracture or dislocation  Mild osteopenia  Fracture is nondisplaced  Assessment and plan:  Encounter Diagnoses  Name Primary?   Wound of right leg, initial encounter Yes   Closed fracture of superior ramus of left pubis, initial encounter (HCC)     The pubic ramus fracture weight-bear as tolerated with a walker for 6 weeks repeat x-ray at around 6 weeks  Consult wound care for wound care management  Dressing reapplied with Xeroform and gauze   Meds ordered this encounter  Medications   sulfamethoxazole -trimethoprim  (BACTRIM  DS) 800-160 MG tablet    Sig: Take 1 tablet by mouth 2 (two) times daily.    Dispense:  60 tablet    Refill:  0

## 2024-04-10 NOTE — Progress Notes (Signed)
  Intake history:  Chief Complaint  Patient presents with   Hip Pain    Left/ fell went to ER 04/01/24 has pelvic fractures      BP 130/77 Comment: 04/08/24  Ht 5' 3 (1.6 m)   Wt 136 lb (61.7 kg)   BMI 24.09 kg/m  Body mass index is 24.09 kg/m.  Pharmacy? ________[facility] ______________________________  WHAT ARE WE SEEING YOU FOR TODAY?   Left pelvic pain / ER follow up from 04/01/24  How long has this bothered you? (DOI?DOS?WS?)  on or around 03/25/24  Was there an injury? Yes  Anticoag.  No  Diabetes No  Heart disease No  Hypertension No  SMOKING HX No  Kidney disease No  Any ALLERGIES ______________ Allergies  Allergen Reactions   Codeine Itching and Nausea And Vomiting   Zocor [Simvastatin] Nausea And Vomiting and Rash   Molds & Smuts Other (See Comments)    Itchy, watery eyes    ________________________________   Treatment:  Have you taken:  Tylenol  Yes  Advil No  Had PT No  Had injection No  Other  ____________Hydrocodone _____________

## 2024-04-16 NOTE — Assessment & Plan Note (Signed)
 Delayed healing with leakage and tightness due to skin tension. Previously infected, now less erythematous. - Apply Silvadene  cream as directed. - Use gauze and nonstick pads for dressing. - Consider wet dressing and wrapping for tightness. - Refer to wound care center if no improvement.

## 2024-05-01 ENCOUNTER — Encounter (HOSPITAL_BASED_OUTPATIENT_CLINIC_OR_DEPARTMENT_OTHER): Attending: Internal Medicine | Admitting: Internal Medicine

## 2024-05-01 DIAGNOSIS — L97818 Non-pressure chronic ulcer of other part of right lower leg with other specified severity: Secondary | ICD-10-CM | POA: Diagnosis not present

## 2024-05-01 DIAGNOSIS — I87311 Chronic venous hypertension (idiopathic) with ulcer of right lower extremity: Secondary | ICD-10-CM | POA: Diagnosis not present

## 2024-05-01 DIAGNOSIS — T798XXA Other early complications of trauma, initial encounter: Secondary | ICD-10-CM | POA: Diagnosis not present

## 2024-05-01 DIAGNOSIS — W19XXXA Unspecified fall, initial encounter: Secondary | ICD-10-CM | POA: Insufficient documentation

## 2024-05-01 DIAGNOSIS — S81811A Laceration without foreign body, right lower leg, initial encounter: Secondary | ICD-10-CM

## 2024-05-06 DIAGNOSIS — L97818 Non-pressure chronic ulcer of other part of right lower leg with other specified severity: Secondary | ICD-10-CM | POA: Diagnosis not present

## 2024-05-08 ENCOUNTER — Encounter (HOSPITAL_BASED_OUTPATIENT_CLINIC_OR_DEPARTMENT_OTHER): Admitting: Internal Medicine

## 2024-05-08 DIAGNOSIS — L97818 Non-pressure chronic ulcer of other part of right lower leg with other specified severity: Secondary | ICD-10-CM

## 2024-05-08 DIAGNOSIS — I87311 Chronic venous hypertension (idiopathic) with ulcer of right lower extremity: Secondary | ICD-10-CM

## 2024-05-12 ENCOUNTER — Encounter: Payer: Self-pay | Admitting: Radiology

## 2024-05-14 ENCOUNTER — Other Ambulatory Visit (HOSPITAL_COMMUNITY): Payer: Self-pay

## 2024-05-14 DIAGNOSIS — Z1231 Encounter for screening mammogram for malignant neoplasm of breast: Secondary | ICD-10-CM

## 2024-05-15 ENCOUNTER — Encounter (HOSPITAL_BASED_OUTPATIENT_CLINIC_OR_DEPARTMENT_OTHER): Attending: Internal Medicine | Admitting: Internal Medicine

## 2024-05-15 DIAGNOSIS — T798XXA Other early complications of trauma, initial encounter: Secondary | ICD-10-CM | POA: Insufficient documentation

## 2024-05-15 DIAGNOSIS — L97818 Non-pressure chronic ulcer of other part of right lower leg with other specified severity: Secondary | ICD-10-CM | POA: Insufficient documentation

## 2024-05-15 DIAGNOSIS — I87311 Chronic venous hypertension (idiopathic) with ulcer of right lower extremity: Secondary | ICD-10-CM | POA: Insufficient documentation

## 2024-05-15 DIAGNOSIS — S81811A Laceration without foreign body, right lower leg, initial encounter: Secondary | ICD-10-CM | POA: Insufficient documentation

## 2024-05-19 DIAGNOSIS — S32512A Fracture of superior rim of left pubis, initial encounter for closed fracture: Secondary | ICD-10-CM | POA: Insufficient documentation

## 2024-05-21 ENCOUNTER — Encounter (HOSPITAL_BASED_OUTPATIENT_CLINIC_OR_DEPARTMENT_OTHER): Admitting: Internal Medicine

## 2024-05-21 DIAGNOSIS — L97818 Non-pressure chronic ulcer of other part of right lower leg with other specified severity: Secondary | ICD-10-CM | POA: Diagnosis not present

## 2024-05-21 DIAGNOSIS — S81811A Laceration without foreign body, right lower leg, initial encounter: Secondary | ICD-10-CM

## 2024-05-21 DIAGNOSIS — I87311 Chronic venous hypertension (idiopathic) with ulcer of right lower extremity: Secondary | ICD-10-CM

## 2024-05-21 DIAGNOSIS — T798XXA Other early complications of trauma, initial encounter: Secondary | ICD-10-CM

## 2024-05-22 ENCOUNTER — Ambulatory Visit: Admitting: Orthopedic Surgery

## 2024-05-22 ENCOUNTER — Encounter: Payer: Self-pay | Admitting: Orthopedic Surgery

## 2024-05-22 ENCOUNTER — Other Ambulatory Visit: Payer: Self-pay

## 2024-05-22 ENCOUNTER — Other Ambulatory Visit (INDEPENDENT_AMBULATORY_CARE_PROVIDER_SITE_OTHER): Payer: Self-pay

## 2024-05-22 DIAGNOSIS — S32512D Fracture of superior rim of left pubis, subsequent encounter for fracture with routine healing: Secondary | ICD-10-CM

## 2024-05-22 DIAGNOSIS — F325 Major depressive disorder, single episode, in full remission: Secondary | ICD-10-CM

## 2024-05-22 NOTE — Progress Notes (Signed)
    05/22/2024   Chief Complaint  Patient presents with   Hip Pain    Pelvic fracture     Encounter Diagnosis  Name Primary?   Closed fracture of superior ramus of left pubis with routine healing, subsequent encounter 04/01/24 Yes    What pharmacy do you use ? __[facility] _________________________  DOI/DOS/ Date: 04/01/24  Did you get better, worse or no change (Answer below)   Improved

## 2024-05-22 NOTE — Progress Notes (Signed)
 Fracture care follow-up Encounter Diagnosis  Name Primary?   Closed fracture of superior ramus of left pubis with routine healing, subsequent encounter 04/01/24 Yes    Chief Complaint  Patient presents with   Hip Pain    Pelvic fracture     Patient reports minimal discomfort  She does have to sit on a pad every now and then but has no pain in the front of the pelvis  Her examination shows excellent motion of the left hip  She had an x-ray her fracture is healed  DG Pelvis 1-2 Views Result Date: 05/22/2024 [facility] pelvis left pubic rami fractures X-rays show healing of those fractures We see normal articulations of the right and left hip with lumbar spine scoliosis and degenerative disc disease Impression healed left pubic rami fractures     Encounter Diagnosis  Name Primary?   Closed fracture of superior ramus of left pubis with routine healing, subsequent encounter 04/01/24 Yes    Assessment and plan healed superior pubic ramus fracture left hemipelvis released follow-up as needed

## 2024-05-29 ENCOUNTER — Encounter (HOSPITAL_BASED_OUTPATIENT_CLINIC_OR_DEPARTMENT_OTHER): Admitting: Internal Medicine

## 2024-05-29 DIAGNOSIS — L97818 Non-pressure chronic ulcer of other part of right lower leg with other specified severity: Secondary | ICD-10-CM | POA: Diagnosis not present

## 2024-05-29 DIAGNOSIS — T798XXA Other early complications of trauma, initial encounter: Secondary | ICD-10-CM | POA: Diagnosis not present

## 2024-05-29 DIAGNOSIS — I87311 Chronic venous hypertension (idiopathic) with ulcer of right lower extremity: Secondary | ICD-10-CM | POA: Diagnosis not present

## 2024-05-30 ENCOUNTER — Ambulatory Visit

## 2024-06-04 ENCOUNTER — Encounter (HOSPITAL_BASED_OUTPATIENT_CLINIC_OR_DEPARTMENT_OTHER): Admitting: Internal Medicine

## 2024-06-04 DIAGNOSIS — T798XXA Other early complications of trauma, initial encounter: Secondary | ICD-10-CM

## 2024-06-04 DIAGNOSIS — L97818 Non-pressure chronic ulcer of other part of right lower leg with other specified severity: Secondary | ICD-10-CM | POA: Diagnosis not present

## 2024-06-04 DIAGNOSIS — I87311 Chronic venous hypertension (idiopathic) with ulcer of right lower extremity: Secondary | ICD-10-CM | POA: Diagnosis not present

## 2024-06-07 ENCOUNTER — Ambulatory Visit
Admission: EM | Admit: 2024-06-07 | Discharge: 2024-06-07 | Disposition: A | Discharge status: Home / Self Care | Attending: Family Medicine | Admitting: Family Medicine

## 2024-06-07 DIAGNOSIS — J069 Acute upper respiratory infection, unspecified: Secondary | ICD-10-CM

## 2024-06-07 MED ORDER — PROMETHAZINE-DM 6.25-15 MG/5ML PO SYRP: 5.0000 mL | ORAL_SOLUTION | Freq: Four times a day (QID) | ORAL | 0 refills | Status: AC | PRN

## 2024-06-07 MED ORDER — AZELASTINE HCL 0.1 % NA SOLN: 1.0000 | Freq: Two times a day (BID) | NASAL | 0 refills | Status: AC

## 2024-06-07 NOTE — Discharge Instructions (Addendum)
 In addition to the prescribed medications, you may take Coricidin HBP, plain Mucinex, use Flonase up to twice daily, saline sinus rinses, humidifiers, drink plenty of fluids, get lots of rest.  Follow-up for significantly worsening symptoms

## 2024-06-07 NOTE — ED Triage Notes (Signed)
 Pt reports sore throat cough and  congestion denies fever x 3 days. Pt has been trying OTC meds but has found no relief.

## 2024-06-08 NOTE — ED Provider Notes (Signed)
 [facility]    CSN: [id number] Arrival date & time: 06/07/24  1518      History   Chief Complaint No chief complaint on file.   HPI Anne Davenport is a 83 y.o. female.   Patient presenting today with 3-day history of sore throat, cough, congestion.  Denies fever, chills, chest pain, shortness of breath, Donnell pain, vomiting, diarrhea.  So far trying over-the-counter cold congestion medication with minimal relief.  No known history of chronic pulmonary disease.    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   Screening for STD (sexually transmitted disease) 07/23/2015    Patient Active Problem List   Diagnosis Date Noted   Closed fracture of left superior rim of pubis (HCC) 05/19/2024   Laceration of right lower leg 03/30/2024   Arthritis of right hip 03/30/2024   Vasomotor symptoms due to menopause 08/27/2023   Mass of right submandibular region 08/27/2023   Chest pressure 03/14/2023   Family history of coronary artery disease in daughter 03/14/2023   Prediabetes 02/22/2023   MDD (major depressive disorder) 02/22/2023   Insomnia 02/22/2023   Hyperlipidemia 02/22/2023   Chronic musculoskeletal pain 02/22/2023   Intermittent chest pain 02/22/2023   Herpes 02/01/2023   Vesicles 02/01/2023   Dysuria 01/13/2022   Hematuria 01/13/2022   Urinary frequency 01/13/2022   S/P hysterectomy 01/13/2022   Boil, vulva 01/30/2020   RLQ abdominal pain 10/23/2019   Yeast vaginitis 10/23/2019   Vaginal irritation 10/23/2019   Bladder pain 10/23/2019   Type 2 diabetes mellitus with diabetic neuropathy (HCC) 03/13/2019    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   Right knee pain 08/18/2015   Right shoulder pain 08/18/2015   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   Screening for STD (sexually transmitted disease) 07/23/2015   Bartholin cyst 05/20/2015   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, 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   Iatrogenic hypothyroidism 04/09/2015   High cholesterol 09/07/2014    Past Surgical History:  Procedure Laterality Date   ABDOMINAL HYSTERECTOMY     BLADDER REPAIR     COLONOSCOPY N/A 06/24/2015   Procedure: COLONOSCOPY;  Surgeon: Claudis RAYMOND Rivet, MD;  Location: [facility];  Service: Endoscopy;  Laterality: N/A;  730   ESOPHAGOGASTRODUODENOSCOPY N/A 08/14/2014   Procedure: ESOPHAGOGASTRODUODENOSCOPY (EGD);  Surgeon: Claudis RAYMOND Rivet, MD;  Location: [facility];  Service: Endoscopy;  Laterality: N/A;  1:10   TONSILLECTOMY     WRIST SURGERY Left     OB History     Gravida  4   Para  3   Term      Preterm      AB  1   Living  2      SAB  1   IAB      Ectopic      Multiple      Live Births               Home Medications    Prior to Admission medications   Medication Sig Start Date End Date Taking? Authorizing Provider  azelastine (ASTELIN) 0.1 % nasal spray Place 1 spray  into both nostrils 2 (two) times daily. Use in each nostril as directed 06/07/24  Yes Stuart Vernell Norris, PA-C  promethazine -dextromethorphan (PROMETHAZINE -DM) 6.25-15 MG/5ML syrup Take 5 mLs by mouth 4 (four) times daily as needed. 06/07/24  Yes Stuart Vernell Norris, PA-C  acetaminophen  (TYLENOL ) 500 MG tablet Take 1,000 mg by mouth every 6 (six) hours as needed for mild pain (pain score 1-3), moderate pain (pain score 4-6) or headache.    [provider]  calcium carbonate (OSCAL) 1500 (600 Ca) MG TABS tablet Take 600 mg of elemental calcium by mouth daily with breakfast.    [provider]  cetirizine (ZYRTEC) 10 MG tablet Take 5 mg by mouth daily as  needed for allergies.    [provider]  Cholecalciferol (VITAMIN D3) 50 MCG (2000 UT) capsule Take 4,000 Units by mouth daily.    [provider]  estradiol  (DOTTI ) 0.075 MG/24HR Place 1 patch onto the skin 2 (two) times a week. Patient taking differently: Place 1 patch onto the skin 2 (two) times a week. Tuesday and Friday 02/07/24   Bevely Doffing, FNP  fluticasone (FLONASE) 50 MCG/ACT nasal spray Place 2 sprays into the nose daily as needed for rhinitis or allergies.    [provider]  HYDROcodone -acetaminophen  (NORCO/VICODIN) 5-325 MG tablet Take 2 tablets by mouth every 4 (four) hours as needed. 04/03/24   Patsey Lot, MD  levothyroxine  (SYNTHROID ) 75 MCG tablet TAKE ONE (1) TABLET BY MOUTH EVERY DAY IN THE MORNING ON AN EMPTY STOMACH Patient taking differently: Take 75 mcg by mouth daily before breakfast. 02/05/24   Bevely Doffing, FNP  lidocaine  (LIDODERM ) 5 % Place 1 patch onto the skin daily. Remove & Discard patch within 12 hours or as directed by MD Patient not taking: Reported on 04/08/2024 11/14/23   Brenna Lin, MD  lovastatin (MEVACOR) 40 MG tablet TAKE ONE (1) TABLET BY MOUTH EVERY DAY Patient taking differently: Take 40 mg by mouth at bedtime. 11/06/23   Melvenia Manus BRAVO, MD  Naproxen  Sodium (ALEVE  PO) Take 220 mg by mouth as needed (back pain). Patient not taking: Reported on 04/08/2024    [provider]  silver  sulfADIAZINE  (SILVADENE ) 1 % cream Apply 1 Application topically daily. Patient not taking: Reported on 04/08/2024 04/07/24   Bevely Doffing, FNP  sulfamethoxazole -trimethoprim  (BACTRIM  DS) 800-160 MG tablet Take 1 tablet by mouth 2 (two) times daily. 04/10/24   Margrette Taft BRAVO, MD  venlafaxine  XR (EFFEXOR -XR) 37.5 MG 24 hr capsule TAKE ONE CAPSULE (37.5 MG TOTAL) BY MOUTH DAILY. 05/22/24   Bevely Doffing, FNP    Family History Family History  Problem Relation Age of Onset   Diabetes Mother    Hypertension Mother    Alcohol  abuse Brother    Cancer Daughter        breast   Hodgkin's lymphoma Daughter    Hypertension Daughter    Heart disease Daughter    Diabetes Maternal Grandfather    Heart disease Maternal Grandfather    Other Paternal Grandfather        poor circulation    Social History Social History   Tobacco Use   Smoking status: Former    Current packs/day: 0.00    Average packs/day: 0.5 packs/day for 10.0 years (5.0 ttl pk-yrs)    Types: Cigarettes    Start date: 07/09/1974    Quit date: 07/09/1984    Years since quitting: 39.9   Smokeless tobacco: Never  Vaping Use   Vaping status: Never Used  Substance Use Topics   Alcohol use: Yes    Alcohol/week: 0.0 standard drinks of alcohol    Comment: once in a while   Drug use: No     Allergies   Codeine, Zocor [simvastatin], and Molds & smuts   Review of Systems Review of Systems Per HPI  Physical Exam Triage Vital Signs ED Triage Vitals  Encounter Vitals Group     BP 06/07/24 1521 (!) 141/81     Girls Systolic BP Percentile --      Girls Diastolic BP Percentile --      Boys Systolic BP Percentile --      Boys Diastolic BP Percentile --      Pulse Rate 06/07/24 1521 76     Resp 06/07/24 1521 20     Temp 06/07/24 1521 97.6 F (36.4 C)     Temp Source 06/07/24 1521 Oral     SpO2 06/07/24 1521 95 %     Weight --      Height --      Head Circumference --      Peak Flow --      Pain Score 06/07/24 1523 0     Pain Loc --      Pain Education --      Exclude from Growth Chart --    No data found.  Updated Vital Signs BP (!) 141/81 (BP Location: Right Arm)   Pulse 76   Temp 97.6 F (36.4 C) (Oral)   Resp 20   SpO2 95%   Visual Acuity Right Eye Distance:   Left Eye Distance:   Bilateral Distance:    Right Eye Near:   Left Eye Near:    Bilateral Near:     Physical Exam Vitals and nursing note reviewed.  Constitutional:      Appearance: Normal appearance.  HENT:     Head: Atraumatic.     Right Ear:  Tympanic membrane and external ear normal.     Left Ear: Tympanic membrane and external ear normal.     Nose: Rhinorrhea present.     Mouth/Throat:     Mouth: Mucous membranes are moist.     Pharynx: Posterior oropharyngeal erythema present.  Eyes:     Extraocular Movements: Extraocular movements intact.     Conjunctiva/sclera: Conjunctivae normal.  Cardiovascular:     Rate and Rhythm: Normal rate and regular rhythm.     Heart sounds: Normal heart sounds.  Pulmonary:     Effort: Pulmonary effort is normal.     Breath sounds: Normal breath sounds. No wheezing.  Musculoskeletal:        General: Normal range of motion.     Cervical back: Normal range of motion and neck supple.  Skin:    General: Skin is warm and dry.  Neurological:     Mental Status: She is alert and oriented to person, place, and time.  Psychiatric:        Mood and Affect: Mood normal.        Thought Content: Thought content normal.      UC Treatments / Results  Labs (all labs ordered are listed, but only abnormal results are displayed) Labs Reviewed - No data to display  EKG   Radiology No results found.  Procedures Procedures (including critical care time)  Medications Ordered in UC Medications - No data to display  Initial Impression / Assessment and Plan / UC Course  I have reviewed the triage vital signs and the nursing  notes.  Pertinent labs & imaging results that were available during my care of the patient were reviewed by me and considered in my medical decision making (see chart for details).     Vital signs and exam very reassuring today, consistent with viral respiratory infection.  Discussed Astelin, Phenergan  DM, supportive over-the-counter medications and home care.  Return for worsening or unresolving symptoms.  Final Clinical Impressions(s) / UC Diagnoses   Final diagnoses:  Viral URI with cough     Discharge Instructions      In addition to the prescribed medications,  you may take Coricidin HBP, plain Mucinex, use Flonase up to twice daily, saline sinus rinses, humidifiers, drink plenty of fluids, get lots of rest.  Follow-up for significantly worsening symptoms    ED Prescriptions     Medication Sig Dispense Auth. Provider   azelastine (ASTELIN) 0.1 % nasal spray Place 1 spray into both nostrils 2 (two) times daily. Use in each nostril as directed 30 mL Stuart Vernell Norris, PA-C   promethazine -dextromethorphan (PROMETHAZINE -DM) 6.25-15 MG/5ML syrup Take 5 mLs by mouth 4 (four) times daily as needed. 100 mL Stuart Vernell Norris, [place]      PDMP not reviewed this encounter.   Stuart Vernell Norris, [place] 06/08/24 1534

## 2024-06-10 ENCOUNTER — Ambulatory Visit

## 2024-06-12 ENCOUNTER — Encounter (HOSPITAL_BASED_OUTPATIENT_CLINIC_OR_DEPARTMENT_OTHER): Admitting: Internal Medicine

## 2024-06-12 DIAGNOSIS — S81811A Laceration without foreign body, right lower leg, initial encounter: Secondary | ICD-10-CM | POA: Insufficient documentation

## 2024-06-12 DIAGNOSIS — L97818 Non-pressure chronic ulcer of other part of right lower leg with other specified severity: Secondary | ICD-10-CM | POA: Insufficient documentation

## 2024-06-12 DIAGNOSIS — I87311 Chronic venous hypertension (idiopathic) with ulcer of right lower extremity: Secondary | ICD-10-CM | POA: Insufficient documentation

## 2024-06-12 DIAGNOSIS — W1809XA Striking against other object with subsequent fall, initial encounter: Secondary | ICD-10-CM | POA: Diagnosis not present

## 2024-06-12 DIAGNOSIS — E119 Type 2 diabetes mellitus without complications: Secondary | ICD-10-CM | POA: Insufficient documentation

## 2024-06-12 DIAGNOSIS — T798XXA Other early complications of trauma, initial encounter: Secondary | ICD-10-CM | POA: Insufficient documentation

## 2024-06-18 ENCOUNTER — Encounter (HOSPITAL_COMMUNITY): Payer: Self-pay

## 2024-06-18 ENCOUNTER — Inpatient Hospital Stay (HOSPITAL_COMMUNITY): Admission: RE | Admit: 2024-06-18 | Discharge: 2024-06-18

## 2024-06-18 DIAGNOSIS — Z1231 Encounter for screening mammogram for malignant neoplasm of breast: Secondary | ICD-10-CM | POA: Diagnosis present

## 2024-06-19 ENCOUNTER — Encounter (HOSPITAL_BASED_OUTPATIENT_CLINIC_OR_DEPARTMENT_OTHER): Admitting: Internal Medicine

## 2024-06-19 DIAGNOSIS — L97818 Non-pressure chronic ulcer of other part of right lower leg with other specified severity: Secondary | ICD-10-CM

## 2024-06-19 DIAGNOSIS — T798XXA Other early complications of trauma, initial encounter: Secondary | ICD-10-CM | POA: Diagnosis not present

## 2024-06-19 DIAGNOSIS — S81811A Laceration without foreign body, right lower leg, initial encounter: Secondary | ICD-10-CM

## 2024-06-19 DIAGNOSIS — I87311 Chronic venous hypertension (idiopathic) with ulcer of right lower extremity: Secondary | ICD-10-CM | POA: Diagnosis not present

## 2024-06-25 ENCOUNTER — Ambulatory Visit: Admitting: Orthopedic Surgery

## 2024-06-25 DIAGNOSIS — M7061 Trochanteric bursitis, right hip: Secondary | ICD-10-CM

## 2024-06-25 MED ORDER — METHYLPREDNISOLONE ACETATE 40 MG/ML IJ SUSP
40.0000 mg | Freq: Once | INTRAMUSCULAR | Status: AC
  Administered 2024-06-25: 11:00:00 40 mg via INTRA_ARTICULAR

## 2024-06-25 NOTE — Progress Notes (Signed)
 Chief Complaint  Patient presents with   Injections    WANTS INJECTION RIGHT HIP 47    83 year old female history of trochanteric bursitis right hip last injection was September  He complains of the right greater trochanter with some pain in the buttock and radiating into the lower back area  Tenderness is noted over the right greater trochanter  Injection should help    Procedure note for injection   Chief Complaint  Patient presents with   Injections    WANTS INJECTION RIGHT HIP TROCHANTER    No diagnosis found.      The patient has consented for injection of the RIGHT GREATER TROCHANTER   Medication: Depo-Medrol  40 mg and lidocaine  1%  Time out completed: Yes  The site of injection was cleaned with alcohol and ethyl chloride.  The injection was given without any complications appropriate precautions were given.  NO COMPLICATIONS WERE NOTED   RETURN AS NEEDED

## 2024-06-26 ENCOUNTER — Encounter (HOSPITAL_BASED_OUTPATIENT_CLINIC_OR_DEPARTMENT_OTHER): Admitting: Internal Medicine

## 2024-06-26 DIAGNOSIS — I87311 Chronic venous hypertension (idiopathic) with ulcer of right lower extremity: Secondary | ICD-10-CM | POA: Diagnosis not present

## 2024-07-01 ENCOUNTER — Encounter (HOSPITAL_BASED_OUTPATIENT_CLINIC_OR_DEPARTMENT_OTHER): Admitting: General Surgery

## 2024-07-01 DIAGNOSIS — I87311 Chronic venous hypertension (idiopathic) with ulcer of right lower extremity: Secondary | ICD-10-CM | POA: Diagnosis not present

## 2024-07-01 DIAGNOSIS — M7061 Trochanteric bursitis, right hip: Secondary | ICD-10-CM

## 2024-07-09 ENCOUNTER — Encounter (HOSPITAL_BASED_OUTPATIENT_CLINIC_OR_DEPARTMENT_OTHER): Admitting: Internal Medicine

## 2024-07-11 ENCOUNTER — Ambulatory Visit: Admitting: Orthopedic Surgery

## 2024-07-17 ENCOUNTER — Encounter (HOSPITAL_BASED_OUTPATIENT_CLINIC_OR_DEPARTMENT_OTHER): Attending: Internal Medicine | Admitting: Internal Medicine

## 2024-07-17 DIAGNOSIS — E119 Type 2 diabetes mellitus without complications: Secondary | ICD-10-CM | POA: Insufficient documentation

## 2024-07-17 DIAGNOSIS — Z09 Encounter for follow-up examination after completed treatment for conditions other than malignant neoplasm: Secondary | ICD-10-CM | POA: Insufficient documentation

## 2024-07-17 DIAGNOSIS — Z872 Personal history of diseases of the skin and subcutaneous tissue: Secondary | ICD-10-CM | POA: Diagnosis not present

## 2024-07-17 DIAGNOSIS — I87301 Chronic venous hypertension (idiopathic) without complications of right lower extremity: Secondary | ICD-10-CM | POA: Diagnosis not present

## 2024-07-22 ENCOUNTER — Ambulatory Visit

## 2024-07-25 ENCOUNTER — Ambulatory Visit

## 2024-08-01 ENCOUNTER — Other Ambulatory Visit (HOSPITAL_COMMUNITY): Payer: Self-pay

## 2024-08-01 DIAGNOSIS — Z1231 Encounter for screening mammogram for malignant neoplasm of breast: Secondary | ICD-10-CM

## 2024-09-01 ENCOUNTER — Ambulatory Visit

## 2025-06-19 ENCOUNTER — Ambulatory Visit (HOSPITAL_COMMUNITY)
